# Patient Record
Sex: Female | Born: 1965 | Race: White | Hispanic: No | Marital: Married | Smoking: Never smoker
Health system: Southern US, Community
[De-identification: ages and names within clinical notes are randomized; demographics above are authoritative.]

## PROBLEM LIST (undated history)

## (undated) DIAGNOSIS — N920 Excessive and frequent menstruation with regular cycle: Secondary | ICD-10-CM

## (undated) DIAGNOSIS — Z9889 Other specified postprocedural states: Secondary | ICD-10-CM

## (undated) DIAGNOSIS — I4891 Unspecified atrial fibrillation: Secondary | ICD-10-CM

## (undated) DIAGNOSIS — O021 Missed abortion: Secondary | ICD-10-CM

## (undated) DIAGNOSIS — K219 Gastro-esophageal reflux disease without esophagitis: Secondary | ICD-10-CM

## (undated) DIAGNOSIS — E7800 Pure hypercholesterolemia, unspecified: Secondary | ICD-10-CM

## (undated) DIAGNOSIS — R000 Tachycardia, unspecified: Secondary | ICD-10-CM

## (undated) HISTORY — DX: Tachycardia, unspecified: R00.0

## (undated) HISTORY — PX: BACK SURGERY: SHX140

## (undated) HISTORY — PX: WISDOM TOOTH EXTRACTION: SHX21

## (undated) HISTORY — PX: TUBAL LIGATION: SHX77

## (undated) HISTORY — DX: Unspecified atrial fibrillation: I48.91

## (undated) HISTORY — DX: Other specified postprocedural states: Z98.890

## (undated) HISTORY — DX: Excessive and frequent menstruation with regular cycle: N92.0

## (undated) HISTORY — PX: ABDOMINAL HYSTERECTOMY: SHX81

---

## 1999-02-06 ENCOUNTER — Encounter: Payer: Self-pay | Admitting: Radiology

## 1999-02-06 ENCOUNTER — Emergency Department (HOSPITAL_COMMUNITY): Admission: EM | Admit: 1999-02-06 | Discharge: 1999-02-06 | Payer: Self-pay | Admitting: Emergency Medicine

## 1999-11-16 ENCOUNTER — Other Ambulatory Visit: Admission: RE | Admit: 1999-11-16 | Discharge: 1999-11-16 | Payer: Self-pay | Admitting: *Deleted

## 2000-10-06 ENCOUNTER — Encounter: Admission: RE | Admit: 2000-10-06 | Discharge: 2000-10-06 | Payer: Self-pay | Admitting: Family Medicine

## 2000-10-06 ENCOUNTER — Encounter: Payer: Self-pay | Admitting: Family Medicine

## 2002-01-19 ENCOUNTER — Encounter: Payer: Self-pay | Admitting: Family Medicine

## 2002-01-19 ENCOUNTER — Encounter: Admission: RE | Admit: 2002-01-19 | Discharge: 2002-01-19 | Payer: Self-pay | Admitting: Family Medicine

## 2002-12-07 ENCOUNTER — Other Ambulatory Visit: Admission: RE | Admit: 2002-12-07 | Discharge: 2002-12-07 | Payer: Self-pay | Admitting: *Deleted

## 2003-12-31 ENCOUNTER — Other Ambulatory Visit: Admission: RE | Admit: 2003-12-31 | Discharge: 2003-12-31 | Payer: Self-pay | Admitting: *Deleted

## 2007-03-21 ENCOUNTER — Other Ambulatory Visit: Admission: RE | Admit: 2007-03-21 | Discharge: 2007-03-21 | Payer: Self-pay | Admitting: Family Medicine

## 2007-04-08 ENCOUNTER — Emergency Department (HOSPITAL_COMMUNITY): Admission: EM | Admit: 2007-04-08 | Discharge: 2007-04-08 | Payer: Self-pay | Admitting: Emergency Medicine

## 2008-08-09 HISTORY — PX: SHOULDER ARTHROSCOPY W/ ROTATOR CUFF REPAIR: SHX2400

## 2013-05-18 ENCOUNTER — Other Ambulatory Visit: Payer: Self-pay | Admitting: Obstetrics and Gynecology

## 2013-05-18 DIAGNOSIS — Z1231 Encounter for screening mammogram for malignant neoplasm of breast: Secondary | ICD-10-CM

## 2013-05-31 ENCOUNTER — Ambulatory Visit: Payer: Self-pay

## 2013-06-22 ENCOUNTER — Other Ambulatory Visit: Payer: Self-pay | Admitting: Obstetrics and Gynecology

## 2013-06-25 ENCOUNTER — Other Ambulatory Visit (HOSPITAL_COMMUNITY): Payer: Self-pay | Admitting: Obstetrics and Gynecology

## 2013-06-25 ENCOUNTER — Ambulatory Visit: Payer: Self-pay

## 2013-06-25 NOTE — H&P (Signed)
Martha Washington is a 47 y.o. female P 2-0-1-2 presents for hysterectomy because of menorrhagia and dysmenorrhea.  Over the past year the patient's menses has become heavier and more painful.  Her menstrual flow lasts for six days,  with hourly pad change due to clots.  She also has cramps that will awaken her from sleep rated as a 7/10 on a 10 point pain scale.  Until advised of the dangers, she was managing her cramping with Ibuprofen 800 mg every four hours but now knows the risks of such frequent dosing.  A pelvic ultrasound in October 2014 showed a uterus: 6.70 x 5.37 x 4.19 cm with the endometrium containing poorly defined borders suggestive of adenomyosis;  the endometrial morphology, however, appeared normal per 3D rendering and measured 0.277 cm. Left ovary: 2.69 x 2.14 x 2.00 cm and right ovary: 2.30 x 1.55 x 1.58 cm.  At that same time the patient was found to have a normal TSH, CBC and prolactin.  Her FSH was not in the menopausal range.  She reports dyspareunia but denies urinary frequency, dysuria, hematuria or change in bowel movement.  She goes on to report that though she is not incontinent, she cannot "hold" her urine or bowel movements when she has to go.  A review of both medical and surgical management options for managing her symptoms however, the patient desires definitive therapy in the form of hysterectomy.   Past Medical History  OB History: G 3;  P 2-0-1-2; 1;  SVB  1988 and 1995 largest infant weighed 6 lbs. 8 oz.  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   ROS: Admits to glasses for reading and tension headaches;   denies  vision changes, nasal congestion, dysphagia, tinnitus, dizziness, hoarseness, cough,  chest pain, shortness of breath, nausea, vomiting, diarrhea,constipation,  urinary frequency, urgency  dysuria, hematuria, vaginitis symptoms, pelvic pain, swelling of joints,easy bruising,  myalgias, arthralgias, skin rashes, unexplained weight loss and except as is mentioned in the history of present illness, patient's review of systems is otherwise negative.  Physical Exam  Bp: 112/60     P: 60 bpm    R: 12   Temperature: 98.8 degrees F orally   Weight: 185 lbs.  Height: 5' 5"  BMI: 30.8  Neck: supple without masses or thyromegaly Lungs: clear to auscultation Heart: regular rate and rhythm Abdomen: soft, non-tender and no organomegaly Pelvic:EGBUS- wnl; vagina-normal rugae; uterus-normal size, cervix without lesions or motion tenderness; adnexae-no tenderness or masses Extremities:  no clubbing, cyanosis or edema   Assesment:  Menorrhagia            Dysmenorrhea  Disposition:  Robotically-assisted hysterectomy was reviewed with the patient.  Benefits of the robotic approach include lesser postoperative pain, less blood loss during surgery, reduced risk of injury to other organs due to better visualization with a 3-D HD 10 times magnifying camera, shorter hospital stay between 0-1   night and rapid recovery with return to daily routine in 2-3 weeks. Although robotically-assisted hysterectomy has a longer operative time than traditional laparotomy, in a patient with good medical history, the benefits usually outweigh the risks.  Risks include bleeding, infection, injury to other organs, need for laparotomy, transient post-operative  facial edema, increased risk of pelvic prolapse associated with any hysterectomy as well as earlier onset of menopause. Preservation or preventative removal of the ovaries was also reviewed and left to the patient's discretion. Finally, the option of supracervical hysterectomy was also discussed with the possible but yet unconfirmed benefits of reduction of pelvic prolapse. If supracervical hysterectomy is performed, Pap smear screening would continue as currently recommended, monthly bleeding is possible despite cauterization of cervical canal and a small but possible risk of cervical fibroid development is present.  The patient was given instructions on a Miralax Bowel Prep to be completed the day before her surgery.  Patient informed about FDA warning on use of morcellator dated 11/23/2012.   Discussed:  1. Incidence of post-operative diagnosis of sarcoma in women undergoing a hysterectomy is 2:1000 2. Annual incidence of leiomyosarcoma is 0.64/100,000 women 3. Sarcomas have the highest incidence in women over 65 4. Power morcellation involves risks of spreading tissue / disease. In he case of undiagnosed cancer, it may adversely affect the patient's prognosis.  The patient verbalized understanding of these risks and pre-operative instructions and has consented to proceed with a Robot Assisted Laparoscopic Hysterectomy with Bilateral Salpingectomy at [facility] on July 13, 2013 at 11:45 a.m.   CSN# [id number]   [name] J. Landin Tallon, PA-C  for Dr. Sandra A. Rivard   

## 2013-07-02 ENCOUNTER — Encounter (HOSPITAL_COMMUNITY): Payer: Self-pay | Admitting: Pharmacist

## 2013-07-09 ENCOUNTER — Encounter (HOSPITAL_COMMUNITY): Payer: Self-pay

## 2013-07-09 ENCOUNTER — Encounter (INDEPENDENT_AMBULATORY_CARE_PROVIDER_SITE_OTHER): Payer: Self-pay

## 2013-07-09 ENCOUNTER — Encounter (HOSPITAL_COMMUNITY)
Admission: RE | Admit: 2013-07-09 | Discharge: 2013-07-09 | Disposition: A | Discharge status: Home / Self Care | Payer: 59 | Source: Ambulatory Visit | Attending: Obstetrics and Gynecology | Admitting: Obstetrics and Gynecology

## 2013-07-09 DIAGNOSIS — Z01818 Encounter for other preprocedural examination: Secondary | ICD-10-CM | POA: Insufficient documentation

## 2013-07-09 DIAGNOSIS — Z01812 Encounter for preprocedural laboratory examination: Secondary | ICD-10-CM | POA: Insufficient documentation

## 2013-07-09 HISTORY — DX: Missed abortion: O02.1

## 2013-07-09 HISTORY — DX: Gastro-esophageal reflux disease without esophagitis: K21.9

## 2013-07-09 LAB — CBC
HCT: 36.3 % (ref 36.0–46.0)
Hemoglobin: 12 g/dL (ref 12.0–15.0)
MCH: 27.8 pg (ref 26.0–34.0)
MCHC: 33.1 g/dL (ref 30.0–36.0)
MCV: 84.2 fL (ref 78.0–100.0)
Platelets: 233 10*3/uL (ref 150–400)
RBC: 4.31 MIL/uL (ref 3.87–5.11)
RDW: 14 % (ref 11.5–15.5)
WBC: 4.6 10*3/uL (ref 4.0–10.5)

## 2013-07-09 LAB — BASIC METABOLIC PANEL
BUN: 10 mg/dL (ref 6–23)
CO2: 26 mEq/L (ref 19–32)
Calcium: 9 mg/dL (ref 8.4–10.5)
Chloride: 106 mEq/L (ref 96–112)
Creatinine, Ser: 0.52 mg/dL (ref 0.50–1.10)
GFR calc Af Amer: >90 mL/min (ref >90)
GFR calc non Af Amer: >90 mL/min (ref >90)
Glucose, Bld: 85 mg/dL (ref 70–99)
Potassium: 4.1 mEq/L (ref 3.5–5.1)
Sodium: 139 mEq/L (ref 135–145)

## 2013-07-09 NOTE — Patient Instructions (Addendum)
   Your procedure is scheduled on: Friday, Dec 5  Enter through the [employer] of [facility] at: 945 am Pick up the phone at the desk and dial [phone] and inform us of your arrival.  Please call this number if you have any problems the morning of surgery: [phone]  Remember: Do not eat or drink after midnight: Thursday Take these medicines the morning of surgery with a SIP OF WATER: prilosec  Do not wear jewelry, make-up, or FINGER nail polish No metal in your hair or on your body. Do not wear lotions, powders, perfumes. You may wear deodorant.  Please use your CHG wash as directed prior to surgery.  Do not shave anywhere for at least 12 hours prior to first CHG shower.  Do not bring valuables to the hospital. Contacts, dentures or bridgework may not be worn into surgery.  Leave suitcase in the car. After Surgery it may be brought to your room. For patients being admitted to the hospital, checkout time is 11:00am the day of discharge.  Home with husband Loraine Leriche.

## 2013-07-12 MED ORDER — DEXTROSE 5 % IV SOLN
2.0000 g | INTRAVENOUS | Status: AC
  Administered 2013-07-13: 2 g via INTRAVENOUS
  Filled 2013-07-12: qty 2

## 2013-07-13 ENCOUNTER — Encounter (HOSPITAL_COMMUNITY): Payer: 59 | Admitting: Anesthesiology

## 2013-07-13 ENCOUNTER — Ambulatory Visit (HOSPITAL_COMMUNITY)
Admission: RE | Admit: 2013-07-13 | Discharge: 2013-07-14 | Disposition: A | Discharge status: Home / Self Care | Payer: 59 | Source: Ambulatory Visit | Attending: Obstetrics and Gynecology | Admitting: Obstetrics and Gynecology

## 2013-07-13 ENCOUNTER — Encounter (HOSPITAL_COMMUNITY): Payer: Self-pay | Admitting: *Deleted

## 2013-07-13 ENCOUNTER — Encounter (HOSPITAL_COMMUNITY)
Admission: RE | Disposition: A | Discharge status: Home / Self Care | Payer: Self-pay | Source: Ambulatory Visit | Attending: Obstetrics and Gynecology

## 2013-07-13 ENCOUNTER — Ambulatory Visit (HOSPITAL_COMMUNITY): Payer: 59 | Admitting: Anesthesiology

## 2013-07-13 DIAGNOSIS — N803 Endometriosis of pelvic peritoneum, unspecified: Secondary | ICD-10-CM | POA: Insufficient documentation

## 2013-07-13 DIAGNOSIS — N838 Other noninflammatory disorders of ovary, fallopian tube and broad ligament: Secondary | ICD-10-CM | POA: Insufficient documentation

## 2013-07-13 DIAGNOSIS — N920 Excessive and frequent menstruation with regular cycle: Secondary | ICD-10-CM

## 2013-07-13 DIAGNOSIS — N946 Dysmenorrhea, unspecified: Secondary | ICD-10-CM | POA: Insufficient documentation

## 2013-07-13 DIAGNOSIS — IMO0002 Reserved for concepts with insufficient information to code with codable children: Secondary | ICD-10-CM | POA: Insufficient documentation

## 2013-07-13 DIAGNOSIS — N80109 Endometriosis of ovary, unspecified side, unspecified depth: Secondary | ICD-10-CM | POA: Insufficient documentation

## 2013-07-13 DIAGNOSIS — N801 Endometriosis of ovary: Secondary | ICD-10-CM | POA: Insufficient documentation

## 2013-07-13 HISTORY — PX: ROBOTIC ASSISTED TOTAL HYSTERECTOMY WITH BILATERAL SALPINGO OOPHERECTOMY: SHX6086

## 2013-07-13 HISTORY — DX: Excessive and frequent menstruation with regular cycle: N92.0

## 2013-07-13 LAB — PREGNANCY, URINE: Preg Test, Ur: NEGATIVE

## 2013-07-13 SURGERY — ROBOTIC ASSISTED TOTAL HYSTERECTOMY WITH BILATERAL SALPINGO OOPHORECTOMY
Anesthesia: General | Site: Abdomen

## 2013-07-13 MED ORDER — ROCURONIUM BROMIDE 100 MG/10ML IV SOLN
INTRAVENOUS | Status: AC
  Filled 2013-07-13: qty 1

## 2013-07-13 MED ORDER — HYDROMORPHONE HCL PF 1 MG/ML IJ SOLN
INTRAMUSCULAR | Status: AC
  Filled 2013-07-13: qty 1

## 2013-07-13 MED ORDER — NEOSTIGMINE METHYLSULFATE 1 MG/ML IJ SOLN
INTRAMUSCULAR | Status: DC | PRN
  Administered 2013-07-13: 3 mg via INTRAVENOUS

## 2013-07-13 MED ORDER — KETOROLAC TROMETHAMINE 30 MG/ML IJ SOLN: 15.0000 mg | Freq: Once | INTRAMUSCULAR | Status: DC | PRN

## 2013-07-13 MED ORDER — LACTATED RINGERS IR SOLN
Status: DC | PRN
  Administered 2013-07-13: 3000 mL

## 2013-07-13 MED ORDER — ACETAMINOPHEN 10 MG/ML IV SOLN
1000.0000 mg | Freq: Once | INTRAVENOUS | Status: AC
  Administered 2013-07-13: 1000 mg via INTRAVENOUS
  Filled 2013-07-13: qty 100

## 2013-07-13 MED ORDER — KETOROLAC TROMETHAMINE 30 MG/ML IJ SOLN
INTRAMUSCULAR | Status: DC | PRN
  Administered 2013-07-13: 30 mg via INTRAVENOUS
  Administered 2013-07-13: 30 mg via INTRAMUSCULAR

## 2013-07-13 MED ORDER — MIDAZOLAM HCL 2 MG/2ML IJ SOLN
INTRAMUSCULAR | Status: DC | PRN
  Administered 2013-07-13: 2 mg via INTRAVENOUS

## 2013-07-13 MED ORDER — ONDANSETRON HCL 4 MG/2ML IJ SOLN
INTRAMUSCULAR | Status: AC
  Filled 2013-07-13: qty 2

## 2013-07-13 MED ORDER — PROMETHAZINE HCL 25 MG/ML IJ SOLN: 6.2500 mg | INTRAMUSCULAR | Status: DC | PRN

## 2013-07-13 MED ORDER — MEPERIDINE HCL 25 MG/ML IJ SOLN: 6.2500 mg | INTRAMUSCULAR | Status: DC | PRN

## 2013-07-13 MED ORDER — MIDAZOLAM HCL 2 MG/2ML IJ SOLN
INTRAMUSCULAR | Status: AC
  Filled 2013-07-13: qty 2

## 2013-07-13 MED ORDER — MENTHOL 3 MG MT LOZG: 1.0000 | LOZENGE | OROMUCOSAL | Status: DC | PRN

## 2013-07-13 MED ORDER — LIDOCAINE HCL (CARDIAC) 20 MG/ML IV SOLN
INTRAVENOUS | Status: DC | PRN
  Administered 2013-07-13: 80 mg via INTRAVENOUS

## 2013-07-13 MED ORDER — FENTANYL CITRATE 0.05 MG/ML IJ SOLN
INTRAMUSCULAR | Status: AC
  Filled 2013-07-13: qty 2

## 2013-07-13 MED ORDER — PROPOFOL 10 MG/ML IV BOLUS
INTRAVENOUS | Status: DC | PRN
  Administered 2013-07-13: 200 mg via INTRAVENOUS

## 2013-07-13 MED ORDER — PROMETHAZINE HCL 12.5 MG PO TABS: 12.5000 mg | ORAL_TABLET | Freq: Four times a day (QID) | ORAL | Status: DC | PRN

## 2013-07-13 MED ORDER — STERILE WATER FOR IRRIGATION IR SOLN
Status: DC | PRN
  Administered 2013-07-13: 1000 mL via INTRAVESICAL

## 2013-07-13 MED ORDER — GLYCOPYRROLATE 0.2 MG/ML IJ SOLN
INTRAMUSCULAR | Status: AC
  Filled 2013-07-13: qty 3

## 2013-07-13 MED ORDER — SODIUM CHLORIDE 0.9 % IJ SOLN
INTRAMUSCULAR | Status: AC
  Filled 2013-07-13: qty 10

## 2013-07-13 MED ORDER — HYDROMORPHONE HCL PF 1 MG/ML IJ SOLN
0.2500 mg | INTRAMUSCULAR | Status: DC | PRN
  Administered 2013-07-13 (×3): 0.5 mg via INTRAVENOUS

## 2013-07-13 MED ORDER — PROPOFOL 10 MG/ML IV EMUL
INTRAVENOUS | Status: AC
  Filled 2013-07-13: qty 20

## 2013-07-13 MED ORDER — FENTANYL CITRATE 0.05 MG/ML IJ SOLN
INTRAMUSCULAR | Status: DC | PRN
  Administered 2013-07-13 (×2): 50 ug via INTRAVENOUS
  Administered 2013-07-13 (×2): 100 ug via INTRAVENOUS

## 2013-07-13 MED ORDER — ROPIVACAINE HCL 5 MG/ML IJ SOLN
INTRAMUSCULAR | Status: AC
  Filled 2013-07-13: qty 30

## 2013-07-13 MED ORDER — GLYCOPYRROLATE 0.2 MG/ML IJ SOLN
INTRAMUSCULAR | Status: DC | PRN
  Administered 2013-07-13: 0.6 mg via INTRAVENOUS

## 2013-07-13 MED ORDER — LIDOCAINE HCL (CARDIAC) 20 MG/ML IV SOLN
INTRAVENOUS | Status: AC
  Filled 2013-07-13: qty 5

## 2013-07-13 MED ORDER — OXYCODONE-ACETAMINOPHEN 5-325 MG PO TABS
1.0000 | ORAL_TABLET | ORAL | Status: DC | PRN
  Administered 2013-07-14: 1 via ORAL
  Filled 2013-07-13: qty 1

## 2013-07-13 MED ORDER — ONDANSETRON HCL 4 MG/2ML IJ SOLN
4.0000 mg | Freq: Three times a day (TID) | INTRAMUSCULAR | Status: DC | PRN
  Administered 2013-07-13: 4 mg via INTRAVENOUS
  Filled 2013-07-13: qty 2

## 2013-07-13 MED ORDER — SODIUM CHLORIDE 0.9 % IJ SOLN
INTRAMUSCULAR | Status: AC
  Filled 2013-07-13: qty 50

## 2013-07-13 MED ORDER — ONDANSETRON HCL 4 MG/2ML IJ SOLN
INTRAMUSCULAR | Status: DC | PRN
  Administered 2013-07-13: 4 mg via INTRAVENOUS

## 2013-07-13 MED ORDER — DEXAMETHASONE SODIUM PHOSPHATE 10 MG/ML IJ SOLN
INTRAMUSCULAR | Status: DC | PRN
  Administered 2013-07-13: 10 mg via INTRAVENOUS

## 2013-07-13 MED ORDER — LACTATED RINGERS IV SOLN
INTRAVENOUS | Status: DC
  Administered 2013-07-13 (×2): via INTRAVENOUS

## 2013-07-13 MED ORDER — ROPIVACAINE HCL 5 MG/ML IJ SOLN
INTRAMUSCULAR | Status: DC | PRN
  Administered 2013-07-13: 60 mL

## 2013-07-13 MED ORDER — FENTANYL CITRATE 0.05 MG/ML IJ SOLN
INTRAMUSCULAR | Status: AC
  Filled 2013-07-13: qty 5

## 2013-07-13 MED ORDER — KETOROLAC TROMETHAMINE 30 MG/ML IJ SOLN
INTRAMUSCULAR | Status: AC
  Filled 2013-07-13: qty 1

## 2013-07-13 MED ORDER — INFLUENZA VAC SPLIT QUAD 0.5 ML IM SUSP
0.5000 mL | INTRAMUSCULAR | Status: DC | PRN
  Filled 2013-07-13: qty 0.5

## 2013-07-13 MED ORDER — ONDANSETRON HCL 4 MG PO TABS
4.0000 mg | ORAL_TABLET | Freq: Three times a day (TID) | ORAL | Status: DC | PRN
  Administered 2013-07-13: 4 mg via ORAL
  Filled 2013-07-13: qty 1

## 2013-07-13 MED ORDER — HYDROMORPHONE HCL PF 1 MG/ML IJ SOLN
INTRAMUSCULAR | Status: DC | PRN
  Administered 2013-07-13: 1 mg via INTRAVENOUS

## 2013-07-13 MED ORDER — ROCURONIUM BROMIDE 100 MG/10ML IV SOLN
INTRAVENOUS | Status: DC | PRN
  Administered 2013-07-13: 50 mg via INTRAVENOUS
  Administered 2013-07-13: 20 mg via INTRAVENOUS

## 2013-07-13 MED ORDER — LACTATED RINGERS IV SOLN
INTRAVENOUS | Status: DC
  Administered 2013-07-13 – 2013-07-14 (×2): via INTRAVENOUS

## 2013-07-13 MED ORDER — IBUPROFEN 600 MG PO TABS: ORAL_TABLET | ORAL | Status: DC

## 2013-07-13 MED ORDER — OXYCODONE-ACETAMINOPHEN 5-325 MG PO TABS: 1.0000 | ORAL_TABLET | ORAL | Status: DC | PRN

## 2013-07-13 MED ORDER — IBUPROFEN 600 MG PO TABS
600.0000 mg | ORAL_TABLET | Freq: Four times a day (QID) | ORAL | Status: DC | PRN
  Administered 2013-07-13 – 2013-07-14 (×2): 600 mg via ORAL
  Filled 2013-07-13 (×2): qty 1

## 2013-07-13 MED ORDER — LIDOCAINE HCL 2 % EX GEL
CUTANEOUS | Status: AC
  Filled 2013-07-13: qty 5

## 2013-07-13 MED ORDER — DEXAMETHASONE SODIUM PHOSPHATE 10 MG/ML IJ SOLN
INTRAMUSCULAR | Status: AC
  Filled 2013-07-13: qty 1

## 2013-07-13 MED ORDER — NEOSTIGMINE METHYLSULFATE 1 MG/ML IJ SOLN
INTRAMUSCULAR | Status: AC
  Filled 2013-07-13: qty 1

## 2013-07-13 SURGICAL SUPPLY — 61 items
BARRIER ADHS 3X4 INTERCEED (GAUZE/BANDAGES/DRESSINGS) ×2 IMPLANT
BENZOIN TINCTURE PRP APPL 2/3 (GAUZE/BANDAGES/DRESSINGS) ×2 IMPLANT
CATH FOLEY 3WAY  5CC 16FR (CATHETERS) ×1
CATH FOLEY 3WAY 5CC 16FR (CATHETERS) ×1 IMPLANT
CHLORAPREP W/TINT 26ML (MISCELLANEOUS) ×2 IMPLANT
CLOTH BEACON ORANGE TIMEOUT ST (SAFETY) ×2 IMPLANT
CONT PATH 16OZ SNAP LID 3702 (MISCELLANEOUS) ×2 IMPLANT
CORD ACTIVE DISPOSABLE (ELECTRODE) ×1
CORD ELECTRO ACTIVE DISP (ELECTRODE) ×1 IMPLANT
CORDS BIPOLAR (ELECTRODE) ×2 IMPLANT
COVER MAYO STAND STRL (DRAPES) ×2 IMPLANT
COVER TABLE BACK 60X90 (DRAPES) ×2 IMPLANT
COVER TIP SHEARS 8 DVNC (MISCELLANEOUS) ×1 IMPLANT
COVER TIP SHEARS 8MM DA VINCI (MISCELLANEOUS) ×1
DECANTER SPIKE VIAL GLASS SM (MISCELLANEOUS) ×2 IMPLANT
DERMABOND ADVANCED (GAUZE/BANDAGES/DRESSINGS) ×1
DERMABOND ADVANCED .7 DNX12 (GAUZE/BANDAGES/DRESSINGS) ×1 IMPLANT
DRAPE HUG U DISPOSABLE (DRAPE) ×2 IMPLANT
DRAPE LG THREE QUARTER DISP (DRAPES) ×2 IMPLANT
DRAPE WARM FLUID 44X44 (DRAPE) ×2 IMPLANT
ELECT REM PT RETURN 9FT ADLT (ELECTROSURGICAL) ×2
ELECTRODE REM PT RTRN 9FT ADLT (ELECTROSURGICAL) ×1 IMPLANT
EVACUATOR SMOKE 8.L (FILTER) ×2 IMPLANT
GAUZE VASELINE 3X9 (GAUZE/BANDAGES/DRESSINGS) ×2 IMPLANT
GLOVE BIO SURGEON STRL SZ7 (GLOVE) ×2 IMPLANT
GLOVE BIOGEL PI IND STRL 7.0 (GLOVE) ×1 IMPLANT
GLOVE BIOGEL PI INDICATOR 7.0 (GLOVE) ×1
GLOVE ECLIPSE 6.5 STRL STRAW (GLOVE) ×2 IMPLANT
GOWN STRL REIN XL XLG (GOWN DISPOSABLE) ×2 IMPLANT
KIT ACCESSORY DA VINCI DISP (KITS) ×1
KIT ACCESSORY DVNC DISP (KITS) ×1 IMPLANT
LEGGING LITHOTOMY PAIR STRL (DRAPES) ×2 IMPLANT
NEEDLE HYPO 22GX1.5 SAFETY (NEEDLE) ×2 IMPLANT
OCCLUDER COLPOPNEUMO (BALLOONS) ×2 IMPLANT
PACK LAVH (CUSTOM PROCEDURE TRAY) ×2 IMPLANT
PAD PREP 24X48 CUFFED NSTRL (MISCELLANEOUS) ×2 IMPLANT
PLUG CATH AND CAP STER (CATHETERS) ×2 IMPLANT
PROTECTOR NERVE ULNAR (MISCELLANEOUS) ×2 IMPLANT
SET CYSTO W/LG BORE CLAMP LF (SET/KITS/TRAYS/PACK) ×2 IMPLANT
SET IRRIG TUBING LAPAROSCOPIC (IRRIGATION / IRRIGATOR) ×2 IMPLANT
SOLUTION ELECTROLUBE (MISCELLANEOUS) ×2 IMPLANT
STRIP CLOSURE SKIN 1/2X4 (GAUZE/BANDAGES/DRESSINGS) ×2 IMPLANT
SUT MNCRL AB 3-0 PS2 27 (SUTURE) ×2 IMPLANT
SUT VIC AB 0 CT1 27 (SUTURE) ×7
SUT VIC AB 0 CT1 27XBRD ANBCTR (SUTURE) ×2 IMPLANT
SUT VIC AB 0 CT1 27XBRD ANTBC (SUTURE) ×5 IMPLANT
SUT VICRYL 0 UR6 27IN ABS (SUTURE) ×2 IMPLANT
SYR 50ML LL SCALE MARK (SYRINGE) ×2 IMPLANT
SYSTEM CONVERTIBLE TROCAR (TROCAR) ×2 IMPLANT
TIP RUMI ORANGE 6.7MMX12CM (TIP) ×2 IMPLANT
TIP UTERINE 5.1X6CM LAV DISP (MISCELLANEOUS) ×2 IMPLANT
TIP UTERINE 6.7X6CM WHT DISP (MISCELLANEOUS) ×2 IMPLANT
TIP UTERINE 6.7X8CM BLUE DISP (MISCELLANEOUS) ×2 IMPLANT
TOWEL OR 17X24 6PK STRL BLUE (TOWEL DISPOSABLE) ×2 IMPLANT
TROCAR 12M 150ML BLUNT (TROCAR) ×2 IMPLANT
TROCAR DISP BLADELESS 8 DVNC (TROCAR) ×1 IMPLANT
TROCAR DISP BLADELESS 8MM (TROCAR) ×1
TROCAR XCEL 12X100 BLDLESS (ENDOMECHANICALS) ×2 IMPLANT
TUBING FILTER THERMOFLATOR (ELECTROSURGICAL) ×2 IMPLANT
WARMER LAPAROSCOPE (MISCELLANEOUS) ×2 IMPLANT
WATER STERILE IRR 1000ML POUR (IV SOLUTION) ×2 IMPLANT

## 2013-07-13 NOTE — Discharge Summary (Signed)
Physician Discharge Summary  Patient ID: Martha Washington MRN: [id number] DOB/AGE: [birth year] 47 y.o.  Admit date: 07/13/2013 Discharge date: 07/13/2013   Discharge Diagnoses: Menorrhagia, Dysmenorrhea, Pelvic Adhesions, Posterior Cul-de-sac Lesion and Clinical Endometriosis Active Problems:   * No active hospital problems. *   Operation: Robot Assisted Total Laparoscopic Hysterectomy with Bilateral Salpingectomy, Lysis of Adhesions and Excision of Posterior Cul-de-Sac Lesion  Discharged Condition: Good  Hospital Course: On the date of admission the patient underwent the aforementioned procedures and tolerated them well.  Post operative course was unremarkable with the patient resuming bowel and bladder function on the evening of her surgery and therefore was discharged home.  Disposition: Home to self care  Discharge Medications:    Medication List         ibuprofen 600 MG tablet  Commonly known as:  ADVIL,MOTRIN  1 po pc every 6 hours for 5 days then as needed for pain.     multivitamin with minerals Tabs tablet  Take 1 tablet by mouth daily.     omeprazole 20 MG capsule  Commonly known as:  PRILOSEC  Take 20 mg by mouth daily.     oxyCODONE-acetaminophen 5-325 MG per tablet  Commonly known as:  ROXICET  Take 1-2 tablets by mouth every 4 (four) hours as needed for severe pain.     promethazine 12.5 MG tablet  Commonly known as:  PHENERGAN  Take 1 tablet (12.5 mg total) by mouth every 6 (six) hours as needed for nausea or vomiting.     vitamin B-12 100 MCG tablet  Commonly known as:  CYANOCOBALAMIN  Take 100 mcg by mouth daily.        Follow-up: Dr. Estanislado Pandy,  July 30, 2013 at 9 a.m.   SignedHenreitta Leber, PA-C 07/13/2013, 3:27 PM

## 2013-07-13 NOTE — Interval H&P Note (Signed)
History and Physical Interval Note:  07/13/2013 11:53 AM  Martha Washington  has presented today for surgery, with the diagnosis of Menorrhagia, Dysmenorrhea, Adenomyosis  The various methods of treatment have been discussed with the patient and family. After consideration of risks, benefits and other options for treatment, the patient has consented to  Procedure(s): ROBOTIC ASSISTED TOTAL HYSTERECTOMY WITH BILATERAL SALPINGECTOMY (N/A) as a surgical intervention .  The patient's history has been reviewed, patient examined, no change in status, stable for surgery.  I have reviewed the patient's chart and labs.  Questions were answered to the patient's satisfaction.     Keola Heninger A

## 2013-07-13 NOTE — Op Note (Signed)
Preoperative diagnosis: Menorrhagia and severe dysmenorrhea. Suspicion of adenomyosis.  Postoperative diagnosis: Same   Anesthesia: General  Anesthesiologist: Dr. Brayton Caves  Procedure: Robotically assisted total hysterectomy with bilateral salpingectomy  Surgeon: Dr. Dois Davenport Zineb Glade  Assistant: Henreitta Leber P.A.-C.  Estimated blood loss: 50 cc Procedure:  After being informed of the planned procedure with possible complications including but not limited to bleeding, infection, injury to other organs, need for laparotomy, possible need for morcellation with risks and benefits reviewed, expected hospital stay and recovery, informed consent is obtained and patient is taken to or #7. She is placed in  lithotomy position on a sticky mattress and beanbag with both arms padded and tucked on each side and bilateral knee-high sequential compressive devices. She is given general anesthesia with endotracheal intubation without any complication. She is prepped and draped in a sterile fashion. A three-way Foley catheter is inserted in her bladder.  Pelvic exam reveals: anteverted and normal size uterus with 2 normal adnexa  A weighted speculum is inserted in the vagina and the anterior lip of the cervix is grasped with a tenaculum forcep. We proceed with a paracervical block and vaginal infiltration using ropivacaine 0.5% diluted 1 in 1 with saline. The uterus was then sounded at 10 cm. We easily dilate the cervix using Hegar dilator to  #27 which allows for easy placement of the intrauterine RUMI manipulator with a 4.0 KOH ring and a vaginal occluder. The ring is sutured to the cervix with 0 Vicryl.  Trocar placement is decided. We infiltrate at the umbilicus with 10 cc of ropivacaine per protocol and perform a 10 mm vertical incision which is brought down bluntly to the fascia. The fascia is identified and grasped with Coker forceps. The fascia is incised with Mayo scissors. Peritoneum is entered  bluntly. A pursestring suture of 0 Vicryl is placed on the fascia and a 10 mm Hassan trocar is easily inserted in the abdominal cavity held in placed with a Purstring suture. This allows for easy insufflation of a pneumoperitoneum using warmed CO2 at a maximum pressure of 15 mm of mercury. 60 cc of Ropivacaine 0.5 % diluted 1 in 1 is sent in the pelvis and the patient is positioned in reverse Trendelenburg. We then placed one  8mm robotic trocar on the left, one 8mm robotic trocar on the right and one 5 mm patient's side assistant trocar on the right  after infiltrating every site  with ropivacaine per protocol. The robot is docked on the left of the patient after positioning her in Trendelenburg. A monopolar scissor is inserted in arm #1 and a PK gyrus forcep is inserted in arm #2.  Preparation and docking is completed in 35 minutes.  Observation: uterus is globulous with 2 normal adnexa. Adhesions are xeen between the sigmoid colon and the left pelvic wall all the way to the cul-de-sac. Ovaries are normal except for a corpus luteum on the left ovary. Appendix and liver are normal.  We start with sharp lysis of adhesions on the left pelvic wall and now have a good view of the ureter. We then proceed on the right side by cauterizing the mesosalpynx , the right utero-ovarian ligament and the right round ligament . This is then sectioned with monopolar scissors. This gives [place] entry into the retroperitoneal space with an easy dissection of the anterior broad ligament. This was opened all the way to the left round ligament.   We then proceed with systematic dissection of the bladder over way  from the anterior vaginal cuff which is easily identified with the KOH ring. The plane of dissection is easily identified and confirmed after filling the bladder with 200 cc of saline. We are able to dissect the bladder 2 cm below the KOH ring. We then opened the posterior right broad ligament all the way to the posterior  KOH ring after identifying the full course of the right ureter.   Moving to the left side we cauterize the left round ligament , the left utero-ovarian ligament and  the mesosalpinx in between. This pedicle is the sectionned. Entry into the retroperitoneal space allows [place] to complete dissection of the bladder on the left side and skeletonized the all uterine vessels. The left broad ligament is then dissected all the way to the posterior KOH ring after identifying the full course of the left ureter.   With pressure on the KOH ring and the bladder fully dissected below we are able to cauterize the uterine vessels on both sides at the level of the KOH ring.  The vaginal occluder is inflated and we proceed with a 360 colpotomy using an open monopolar scissors and freeing the uterus entirely with its 2 tubes.  The uterus is delivered vaginally with traction only. The vaginal occluder is reinserted in the vagina to maintain pneumoperitoneum.  Instruments are then modified for a suture cut in arm #1 and a long tip forcep in arm #2. We proceed with closure of the vaginal cuff with figure-of-eight stitches of 0 Vicryl. The needles are attached to the anterior abdominal wall for future removal. We irrigated profusely with warm saline and confirm a satisfactory hemostasis as well as 2 normal ureters with good mobility and no dilatation.The needles are removed via the 8 mm undocked trocar. 2 1/2 sheets of Interceed are placed on the vaginal cuff to avoid future adhesions.  All instruments are then removed and the robot is undocked. Console time: 1 hours and 45 minutes.  All trochars are removed under direct visualization after evacuating the pneumoperitoneum.  The fascia of the supraumbilical incision is closed with the previously placed pursestring suture of 0 Vicryl. All incisions are then closed with subcuticular suture of 3-0 Monocryl and Dermabond.  A speculum is inserted in the vagina to confirm a  adequate closure of the vaginal cuff and good hemostasis.  Instrument and sponge count is complete x2. Estimated blood loss is minimal. The procedure is well tolerated by the patient is taken to recovery room in a well and stable condition.  Specimen: Uterus and tubes weighing 129 g

## 2013-07-13 NOTE — H&P (View-Only) (Signed)
Martha Washington is a 47 y.o. female P 2-0-1-2 presents for hysterectomy because of menorrhagia and dysmenorrhea.  Over the past year the patient's menses has become heavier and more painful.  Her menstrual flow lasts for six days,  with hourly pad change due to clots.  She also has cramps that will awaken her from sleep rated as a 7/10 on a 10 point pain scale.  Until advised of the dangers, she was managing her cramping with Ibuprofen 800 mg every four hours but now knows the risks of such frequent dosing.  A pelvic ultrasound in October 2014 showed a uterus: 6.70 x 5.37 x 4.19 cm with the endometrium containing poorly defined borders suggestive of adenomyosis;  the endometrial morphology, however, appeared normal per 3D rendering and measured 0.277 cm. Left ovary: 2.69 x 2.14 x 2.00 cm and right ovary: 2.30 x 1.55 x 1.58 cm.  At that same time the patient was found to have a normal TSH, CBC and prolactin.  Her FSH was not in the menopausal range.  She reports dyspareunia but denies urinary frequency, dysuria, hematuria or change in bowel movement.  She goes on to report that though she is not incontinent, she cannot "hold" her urine or bowel movements when she has to go.  A review of both medical and surgical management options for managing her symptoms however, the patient desires definitive therapy in the form of hysterectomy.   Past Medical History  OB History: G 3;  P 2-0-1-2; 1;  SVB  1988 and 1995 largest infant weighed 6 lbs. 8 oz.  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   ROS: Admits to glasses for reading and tension headaches;   denies  vision changes, nasal congestion, dysphagia, tinnitus, dizziness, hoarseness, cough,  chest pain, shortness of breath, nausea, vomiting, diarrhea,constipation,  urinary frequency, urgency  dysuria, hematuria, vaginitis symptoms, pelvic pain, swelling of joints,easy bruising,  myalgias, arthralgias, skin rashes, unexplained weight loss and except as is mentioned in the history of present illness, patient's review of systems is otherwise negative.  Physical Exam  Bp: 112/60     P: 60 bpm    R: 12   Temperature: 98.8 degrees F orally   Weight: 185 lbs.  Height: 5\' 5"   BMI: 30.8  Neck: supple without masses or thyromegaly Lungs: clear to auscultation Heart: regular rate and rhythm Abdomen: soft, non-tender and no organomegaly Pelvic:EGBUS- wnl; vagina-normal rugae; uterus-normal size, cervix without lesions or motion tenderness; adnexae-no tenderness or masses Extremities:  no clubbing, cyanosis or edema   Assesment:  Menorrhagia            Dysmenorrhea  Disposition:  Robotically-assisted hysterectomy was reviewed with the patient.  Benefits of the robotic approach include lesser postoperative pain, less blood loss during surgery, reduced risk of injury to other organs due to better visualization with a 3-D HD 10 times magnifying camera, shorter hospital stay between 0-1  night and rapid recovery with return to daily routine in 2-3 weeks. Although robotically-assisted hysterectomy has a longer operative time than traditional laparotomy, in a patient with good medical history, the benefits usually outweigh the risks.  Risks include bleeding, infection, injury to other organs, need for laparotomy, transient post-operative  facial edema, increased risk of pelvic prolapse associated with any hysterectomy as well as earlier onset of menopause. Preservation or preventative removal of the ovaries was also reviewed and left to the patient's discretion. Finally, the option of supracervical hysterectomy was also discussed with the possible but yet unconfirmed benefits of reduction of pelvic prolapse. If supracervical hysterectomy is performed, Pap smear screening would continue as currently recommended, monthly bleeding is possible despite cauterization of cervical canal and a small but possible risk of cervical fibroid development is present.  The patient was given instructions on a Miralax Bowel Prep to be completed the day before her surgery.  Patient informed about FDA warning on use of morcellator dated 11/23/2012.   Discussed:  1. Incidence of post-operative diagnosis of sarcoma in women undergoing a hysterectomy is 2:1000 2. Annual incidence of leiomyosarcoma is 0.64/100,000 women 3. Sarcomas have the highest incidence in women over 65 4. Power morcellation involves risks of spreading tissue / disease. In he case of undiagnosed cancer, it may adversely affect the patient's prognosis.  The patient verbalized understanding of these risks and pre-operative instructions and has consented to proceed with a Robot Assisted Laparoscopic Hysterectomy with Bilateral Salpingectomy at [facility] on July 13, 2013 at 11:45 a.m.   CSN# [id number]   [place] Silveria J. Lowell Guitar, PA-C  for Dr. Crist Fat. Rivard

## 2013-07-13 NOTE — Anesthesia Postprocedure Evaluation (Signed)
  Anesthesia Post Note  Patient: Martha Washington  Procedure(s) Performed: Procedure(s) (LRB): ROBOTIC ASSISTED TOTAL HYSTERECTOMY WITH BILATERAL SALPINGECTOMY (N/A)  Anesthesia type: GA  Patient location: PACU  Post pain: Pain level controlled  Post assessment: Post-op Vital signs reviewed  Last Vitals:  Filed Vitals:   07/13/13 1535  BP: 126/67  Pulse: 62  Temp: 37 C  Resp: 10    Post vital signs: Reviewed  Level of consciousness: sedated  Complications: No apparent anesthesia complications

## 2013-07-13 NOTE — Transfer of Care (Signed)
Immediate Anesthesia Transfer of Care Note  Patient: Martha Washington  Procedure(s) Performed: Procedure(s): ROBOTIC ASSISTED TOTAL HYSTERECTOMY WITH BILATERAL SALPINGECTOMY (N/A)  Patient Location: PACU  Anesthesia Type:General  Level of Consciousness: awake  Airway & Oxygen Therapy: Patient Spontanous Breathing  Post-op Assessment: Report given to PACU RN  Post vital signs: stable  Filed Vitals:   07/13/13 1009  BP: 107/74  Pulse: 64  Temp: 36.6 C  Resp: 18    Complications: No apparent anesthesia complications

## 2013-07-13 NOTE — Anesthesia Preprocedure Evaluation (Signed)
Anesthesia Evaluation  Patient identified by MRN, date of birth, ID band Patient awake    Reviewed: Allergy & Precautions, H&P , NPO status , Patient's Chart, lab work & pertinent test results  Airway Mallampati: I TM Distance: >3 FB Neck ROM: full    Dental no notable dental hx. (+) Teeth Intact   Pulmonary neg pulmonary ROS,    Pulmonary exam normal       Cardiovascular negative cardio ROS      Neuro/Psych negative neurological ROS  negative psych ROS   GI/Hepatic Neg liver ROS, GERD-  Medicated and Controlled,  Endo/Other  negative endocrine ROS  Renal/GU negative Renal ROS  negative genitourinary   Musculoskeletal negative musculoskeletal ROS (+)   Abdominal Normal abdominal exam  (+)   Peds  Hematology negative hematology ROS (+)   Anesthesia Other Findings   Reproductive/Obstetrics negative OB ROS                           Anesthesia Physical Anesthesia Plan  ASA: II  Anesthesia Plan: General   Post-op Pain Management:    Induction: Intravenous  Airway Management Planned: Oral ETT  Additional Equipment:   Intra-op Plan:   Post-operative Plan: Extubation in OR  Informed Consent: I have reviewed the patients History and Physical, chart, labs and discussed the procedure including the risks, benefits and alternatives for the proposed anesthesia with the patient or authorized representative who has indicated his/her understanding and acceptance.   Dental Advisory Given  Plan Discussed with: CRNA and Surgeon  Anesthesia Plan Comments:         Anesthesia Quick Evaluation

## 2013-07-14 NOTE — Progress Notes (Signed)
Dr. Estanislado Pandy called and notified of patient voiding, pain controlled, N/V subsided and pt ambulating. MD confirms patient can be discharged at this time. Discharge instructions reviewed and pt verbalizes understanding. Pt pushed in wheelchair by NT and secured in vehicle.

## 2013-07-16 ENCOUNTER — Encounter (HOSPITAL_COMMUNITY): Payer: Self-pay | Admitting: Obstetrics and Gynecology

## 2014-04-29 ENCOUNTER — Other Ambulatory Visit (HOSPITAL_COMMUNITY)
Admission: RE | Admit: 2014-04-29 | Discharge: 2014-04-29 | Disposition: A | Discharge status: Home / Self Care | Payer: 59 | Source: Ambulatory Visit | Attending: Internal Medicine | Admitting: Internal Medicine

## 2014-04-29 ENCOUNTER — Other Ambulatory Visit: Payer: Self-pay | Admitting: Internal Medicine

## 2014-04-29 DIAGNOSIS — Z1151 Encounter for screening for human papillomavirus (HPV): Secondary | ICD-10-CM | POA: Diagnosis present

## 2014-04-29 DIAGNOSIS — Z01419 Encounter for gynecological examination (general) (routine) without abnormal findings: Secondary | ICD-10-CM | POA: Insufficient documentation

## 2014-05-02 LAB — CYTOLOGY - PAP

## 2014-08-19 ENCOUNTER — Other Ambulatory Visit: Payer: Self-pay

## 2014-08-19 DIAGNOSIS — Z1231 Encounter for screening mammogram for malignant neoplasm of breast: Secondary | ICD-10-CM

## 2014-09-04 ENCOUNTER — Ambulatory Visit
Admission: RE | Admit: 2014-09-04 | Discharge: 2014-09-04 | Disposition: A | Discharge status: Home / Self Care | Payer: 59 | Source: Ambulatory Visit

## 2014-09-04 DIAGNOSIS — Z1231 Encounter for screening mammogram for malignant neoplasm of breast: Secondary | ICD-10-CM

## 2014-09-05 ENCOUNTER — Other Ambulatory Visit: Payer: Self-pay | Admitting: Internal Medicine

## 2014-09-05 DIAGNOSIS — R928 Other abnormal and inconclusive findings on diagnostic imaging of breast: Secondary | ICD-10-CM

## 2014-09-06 ENCOUNTER — Ambulatory Visit
Admission: RE | Admit: 2014-09-06 | Discharge: 2014-09-06 | Disposition: A | Discharge status: Home / Self Care | Payer: 59 | Source: Ambulatory Visit | Attending: Internal Medicine | Admitting: Internal Medicine

## 2014-09-06 DIAGNOSIS — R928 Other abnormal and inconclusive findings on diagnostic imaging of breast: Secondary | ICD-10-CM

## 2014-10-15 ENCOUNTER — Encounter (HOSPITAL_BASED_OUTPATIENT_CLINIC_OR_DEPARTMENT_OTHER): Payer: Self-pay | Admitting: *Deleted

## 2014-10-15 NOTE — Progress Notes (Signed)
No meds-no labs needed 

## 2014-10-16 ENCOUNTER — Other Ambulatory Visit: Payer: Self-pay | Admitting: Physician Assistant

## 2014-10-16 NOTE — H&P (Signed)
  [name] [address]   [address] [place], [place] [id number] [phone] A Division of [facility]  Martha Aveaniel F. Cadee Agro, M.D.   Robert A. Thurston HoleWainer, M.D.   Burnell BlanksW. Dan Caffrey, M.D.   Eulas PostJoshua P. Landau, M.D.   Lunette StandsAnna Voytek, M.D Jewel Baizeimothy D. Eulah PontMurphy, M.D.  Buford DresserWesley R. Ibazebo, M.D.  Estell HarpinJames S. Kramer, M.D.    Melina Fiddlerebecca S. Bassett, M.D. Mary L. Isidoro DonningAnton, PA-C  Kirstin A. Shepperson, PA-C  Josh [place], PA-C HardwickBrandon Parry, [place] DakotaOPA-C   RE: Martha Washington, Martha                                [phone]      DOB: 1965-12-17 PROGRESS NOTE: 07-23-14 This is a 49 year-old female who presents to our clinic today with right shoulder pain.  Martha Washington sustained an indirect vertical load injury to the right shoulder this past Saturday.  Since then she has had significant rest pain and night pain.  This is aggravated with bringing her arm overhead, as well as across her body.  She is unable to sleep on the affected side.  She is also describing some recurrent popping as well.  She does have a history of a right shoulder decompression back in November of 2010, but had no issues between surgical intervention and this new injury.   Past medical, social and family history reviewed in detail on the patient questionnaire and signed.  Review of systems: As detailed in HPI.  All others reviewed and are negative.   EXAMINATION: Well-developed, well-nourished female in no acute distress.  Alert and oriented x 3.  Examination of her right shoulder reveals forward flexion and abduction to about 90 degrees.  She can internally rotate to her back pocket.  Decreased external rotation.  Examination of her right shoulder after subacromial injection reveals near full motion, however this is still painful past 90 degrees of forward flexion.  Positive empty can.  5/5 strength throughout.  She is neurovascularly intact distally.   X-RAYS: Three views of her right shoulder reveal a Type I acromion.   Adequate distal clavicle excision.  No superior migration of the humeral head.    IMPRESSION: Probable traumatic right shoulder bursitis.    PLAN: At this point we do not feel like Martha Washington tore her rotator cuff.  We are proceeding with a 1:4 subacromial injection, in addition to Jobe exercises.  If this is not any better in the next two weeks Martha Washington will call and we will get an MRI.  In the meantime I gave her a prescription for Tramadol to help with the pain.    PROCEDURE NOTE: The patient's clinical condition is marked by substantial pain and/or significant functional disability.  Other conservative therapy has not provided relief, is contraindicated, or not appropriate.  There is a reasonable likelihood that injection will significantly improve the patient's pain and/or functional disability. Patient is seated on the exam table.  The right shoulder is prepped with Betadine and alcohol and injected into the subacromial space with 1:4 Depo-Medrol/Marcaine.  Patient tolerated the procedure without difficulty.   Martha Washington, M.D.   Electronically verified by Martha Aveaniel F. Beadie Matsunaga, M.D. DFM(LA):jjh D 07-24-14 T 07-25-14  Patient seen and examined 10/15/14. MRI partial cuff tear. Continued marked symptoms. Otherwise no change. Proceed with scope and cuff repair.

## 2014-10-17 ENCOUNTER — Ambulatory Visit (HOSPITAL_BASED_OUTPATIENT_CLINIC_OR_DEPARTMENT_OTHER): Payer: 59 | Admitting: Anesthesiology

## 2014-10-17 ENCOUNTER — Encounter (HOSPITAL_BASED_OUTPATIENT_CLINIC_OR_DEPARTMENT_OTHER): Payer: Self-pay

## 2014-10-17 ENCOUNTER — Ambulatory Visit (HOSPITAL_BASED_OUTPATIENT_CLINIC_OR_DEPARTMENT_OTHER)
Admission: RE | Admit: 2014-10-17 | Discharge: 2014-10-17 | Disposition: A | Discharge status: Home / Self Care | Payer: 59 | Source: Ambulatory Visit | Attending: Orthopedic Surgery | Admitting: Orthopedic Surgery

## 2014-10-17 ENCOUNTER — Encounter (HOSPITAL_BASED_OUTPATIENT_CLINIC_OR_DEPARTMENT_OTHER)
Admission: RE | Disposition: A | Discharge status: Home / Self Care | Payer: Self-pay | Source: Ambulatory Visit | Attending: Orthopedic Surgery

## 2014-10-17 DIAGNOSIS — S46011A Strain of muscle(s) and tendon(s) of the rotator cuff of right shoulder, initial encounter: Secondary | ICD-10-CM | POA: Diagnosis not present

## 2014-10-17 DIAGNOSIS — K219 Gastro-esophageal reflux disease without esophagitis: Secondary | ICD-10-CM | POA: Diagnosis not present

## 2014-10-17 DIAGNOSIS — M7541 Impingement syndrome of right shoulder: Secondary | ICD-10-CM | POA: Diagnosis not present

## 2014-10-17 DIAGNOSIS — M24111 Other articular cartilage disorders, right shoulder: Secondary | ICD-10-CM | POA: Diagnosis not present

## 2014-10-17 DIAGNOSIS — M75101 Unspecified rotator cuff tear or rupture of right shoulder, not specified as traumatic: Secondary | ICD-10-CM | POA: Diagnosis present

## 2014-10-17 DIAGNOSIS — X58XXXA Exposure to other specified factors, initial encounter: Secondary | ICD-10-CM | POA: Diagnosis not present

## 2014-10-17 HISTORY — PX: SHOULDER ARTHROSCOPY WITH ROTATOR CUFF REPAIR: SHX5685

## 2014-10-17 LAB — POCT HEMOGLOBIN-HEMACUE: Hemoglobin: 13.6 g/dL (ref 12.0–15.0)

## 2014-10-17 SURGERY — ARTHROSCOPY, SHOULDER, WITH ROTATOR CUFF REPAIR
Anesthesia: General | Site: Shoulder | Laterality: Right

## 2014-10-17 MED ORDER — HYDROMORPHONE HCL 1 MG/ML IJ SOLN: 0.5000 mg | INTRAMUSCULAR | Status: DC | PRN

## 2014-10-17 MED ORDER — ONDANSETRON HCL 4 MG/2ML IJ SOLN: 4.0000 mg | Freq: Once | INTRAMUSCULAR | Status: DC | PRN

## 2014-10-17 MED ORDER — SUCCINYLCHOLINE CHLORIDE 20 MG/ML IJ SOLN
INTRAMUSCULAR | Status: DC | PRN
  Administered 2014-10-17: 100 mg via INTRAVENOUS

## 2014-10-17 MED ORDER — METOCLOPRAMIDE HCL 5 MG/ML IJ SOLN: 5.0000 mg | Freq: Three times a day (TID) | INTRAMUSCULAR | Status: DC | PRN

## 2014-10-17 MED ORDER — LACTATED RINGERS IV SOLN
INTRAVENOUS | Status: DC
  Administered 2014-10-17: 10:00:00 via INTRAVENOUS

## 2014-10-17 MED ORDER — CHLORHEXIDINE GLUCONATE 4 % EX LIQD: 60.0000 mL | Freq: Once | CUTANEOUS | Status: DC

## 2014-10-17 MED ORDER — CLINDAMYCIN PHOSPHATE 900 MG/50ML IV SOLN
INTRAVENOUS | Status: AC
  Filled 2014-10-17: qty 50

## 2014-10-17 MED ORDER — METOCLOPRAMIDE HCL 5 MG PO TABS: 5.0000 mg | ORAL_TABLET | Freq: Three times a day (TID) | ORAL | Status: DC | PRN

## 2014-10-17 MED ORDER — OXYCODONE HCL 5 MG/5ML PO SOLN
5.0000 mg | Freq: Once | ORAL | Status: DC | PRN
Start: 2014-10-17 — End: 2014-10-17

## 2014-10-17 MED ORDER — ONDANSETRON HCL 4 MG PO TABS: 4.0000 mg | ORAL_TABLET | Freq: Three times a day (TID) | ORAL | Status: DC | PRN

## 2014-10-17 MED ORDER — DEXTROSE 5 % IV SOLN: 500.0000 mg | Freq: Four times a day (QID) | INTRAVENOUS | Status: DC | PRN

## 2014-10-17 MED ORDER — FENTANYL CITRATE 0.05 MG/ML IJ SOLN
50.0000 ug | INTRAMUSCULAR | Status: DC | PRN
  Administered 2014-10-17: 100 ug via INTRAVENOUS

## 2014-10-17 MED ORDER — FENTANYL CITRATE 0.05 MG/ML IJ SOLN
INTRAMUSCULAR | Status: AC
Start: 2014-10-17 — End: 2014-10-17
  Filled 2014-10-17: qty 2

## 2014-10-17 MED ORDER — CLINDAMYCIN PHOSPHATE 900 MG/50ML IV SOLN
900.0000 mg | INTRAVENOUS | Status: AC
  Administered 2014-10-17: 900 mg via INTRAVENOUS

## 2014-10-17 MED ORDER — OXYCODONE HCL 5 MG PO TABS: 5.0000 mg | ORAL_TABLET | Freq: Once | ORAL | Status: DC | PRN

## 2014-10-17 MED ORDER — OXYCODONE-ACETAMINOPHEN 5-325 MG PO TABS: 1.0000 | ORAL_TABLET | ORAL | Status: DC | PRN

## 2014-10-17 MED ORDER — ONDANSETRON HCL 4 MG/2ML IJ SOLN
INTRAMUSCULAR | Status: DC | PRN
  Administered 2014-10-17: 4 mg via INTRAVENOUS

## 2014-10-17 MED ORDER — ONDANSETRON HCL 4 MG PO TABS: 4.0000 mg | ORAL_TABLET | Freq: Four times a day (QID) | ORAL | Status: DC | PRN

## 2014-10-17 MED ORDER — ONDANSETRON HCL 4 MG/2ML IJ SOLN: 4.0000 mg | Freq: Four times a day (QID) | INTRAMUSCULAR | Status: DC | PRN

## 2014-10-17 MED ORDER — HYDROMORPHONE HCL 1 MG/ML IJ SOLN: 0.2500 mg | INTRAMUSCULAR | Status: DC | PRN

## 2014-10-17 MED ORDER — PROPOFOL 10 MG/ML IV BOLUS
INTRAVENOUS | Status: DC | PRN
  Administered 2014-10-17: 200 mg via INTRAVENOUS

## 2014-10-17 MED ORDER — FENTANYL CITRATE 0.05 MG/ML IJ SOLN
INTRAMUSCULAR | Status: DC | PRN
  Administered 2014-10-17: 25 ug via INTRAVENOUS

## 2014-10-17 MED ORDER — MIDAZOLAM HCL 2 MG/2ML IJ SOLN
INTRAMUSCULAR | Status: AC
  Filled 2014-10-17: qty 2

## 2014-10-17 MED ORDER — LACTATED RINGERS IV SOLN
INTRAVENOUS | Status: DC
  Administered 2014-10-17 (×2): via INTRAVENOUS

## 2014-10-17 MED ORDER — EPHEDRINE SULFATE 50 MG/ML IJ SOLN
INTRAMUSCULAR | Status: DC | PRN
  Administered 2014-10-17: 10 mg via INTRAVENOUS

## 2014-10-17 MED ORDER — SODIUM CHLORIDE 0.9 % IJ SOLN
INTRAMUSCULAR | Status: AC
  Filled 2014-10-17: qty 10

## 2014-10-17 MED ORDER — PROMETHAZINE HCL 25 MG/ML IJ SOLN
INTRAMUSCULAR | Status: AC
  Filled 2014-10-17: qty 1

## 2014-10-17 MED ORDER — PROPOFOL 10 MG/ML IV BOLUS
INTRAVENOUS | Status: AC
  Filled 2014-10-17: qty 20

## 2014-10-17 MED ORDER — DEXAMETHASONE SODIUM PHOSPHATE 4 MG/ML IJ SOLN
INTRAMUSCULAR | Status: DC | PRN
  Administered 2014-10-17: 10 mg via INTRAVENOUS

## 2014-10-17 MED ORDER — PROMETHAZINE HCL 25 MG/ML IJ SOLN
6.2500 mg | INTRAMUSCULAR | Status: DC | PRN
  Administered 2014-10-17: 6.25 mg via INTRAVENOUS

## 2014-10-17 MED ORDER — METHOCARBAMOL 500 MG PO TABS: 500.0000 mg | ORAL_TABLET | Freq: Four times a day (QID) | ORAL | Status: DC | PRN

## 2014-10-17 MED ORDER — FENTANYL CITRATE 0.05 MG/ML IJ SOLN
INTRAMUSCULAR | Status: AC
  Filled 2014-10-17: qty 6

## 2014-10-17 MED ORDER — BUPIVACAINE-EPINEPHRINE (PF) 0.5% -1:200000 IJ SOLN
INTRAMUSCULAR | Status: DC | PRN
  Administered 2014-10-17: 25 mL via PERINEURAL

## 2014-10-17 MED ORDER — SODIUM CHLORIDE 0.9 % IR SOLN
Status: DC | PRN
  Administered 2014-10-17: 6000 mL

## 2014-10-17 MED ORDER — MIDAZOLAM HCL 2 MG/2ML IJ SOLN
1.0000 mg | INTRAMUSCULAR | Status: DC | PRN
  Administered 2014-10-17: 2 mg via INTRAVENOUS

## 2014-10-17 SURGICAL SUPPLY — 69 items
ANCHOR PEEK SWIVEL LOCK 5.5 (Anchor) ×4 IMPLANT
BENZOIN TINCTURE PRP APPL 2/3 (GAUZE/BANDAGES/DRESSINGS) IMPLANT
BLADE CUTTER GATOR 3.5 (BLADE) ×2 IMPLANT
BLADE CUTTER MENIS 5.5 (BLADE) IMPLANT
BLADE GREAT WHITE 4.2 (BLADE) ×2 IMPLANT
BLADE SURG 15 STRL LF DISP TIS (BLADE) ×1 IMPLANT
BLADE SURG 15 STRL SS (BLADE) ×1
BUR OVAL 6.0 (BURR) IMPLANT
CANNULA DRY DOC 8X75 (CANNULA) IMPLANT
CANNULA TWIST IN 8.25X7CM (CANNULA) IMPLANT
DECANTER SPIKE VIAL GLASS SM (MISCELLANEOUS) IMPLANT
DRAPE STERI 35X30 U-POUCH (DRAPES) ×2 IMPLANT
DRAPE U-SHAPE 47X51 STRL (DRAPES) ×2 IMPLANT
DRAPE U-SHAPE 76X120 STRL (DRAPES) ×4 IMPLANT
DRSG PAD ABDOMINAL 8X10 ST (GAUZE/BANDAGES/DRESSINGS) ×2 IMPLANT
DURAPREP 26ML APPLICATOR (WOUND CARE) ×2 IMPLANT
ELECT MENISCUS 165MM 90D (ELECTRODE) ×2 IMPLANT
ELECT REM PT RETURN 9FT ADLT (ELECTROSURGICAL) ×2
ELECTRODE REM PT RTRN 9FT ADLT (ELECTROSURGICAL) ×1 IMPLANT
GAUZE SPONGE 4X4 12PLY STRL (GAUZE/BANDAGES/DRESSINGS) ×4 IMPLANT
GAUZE XEROFORM 1X8 LF (GAUZE/BANDAGES/DRESSINGS) ×2 IMPLANT
GLOVE BIO SURGEON STRL SZ 6.5 (GLOVE) ×2 IMPLANT
GLOVE BIOGEL PI IND STRL 7.0 (GLOVE) ×3 IMPLANT
GLOVE BIOGEL PI INDICATOR 7.0 (GLOVE) ×3
GLOVE ECLIPSE 7.0 STRL STRAW (GLOVE) ×2 IMPLANT
GLOVE ORTHO TXT STRL SZ7.5 (GLOVE) ×2 IMPLANT
GLOVE SURG ORTHO 8.0 STRL STRW (GLOVE) ×2 IMPLANT
GOWN STRL REUS W/ TWL LRG LVL3 (GOWN DISPOSABLE) ×2 IMPLANT
GOWN STRL REUS W/ TWL XL LVL3 (GOWN DISPOSABLE) ×1 IMPLANT
GOWN STRL REUS W/TWL LRG LVL3 (GOWN DISPOSABLE) ×2
GOWN STRL REUS W/TWL XL LVL3 (GOWN DISPOSABLE) ×1
IV NS IRRIG 3000ML ARTHROMATIC (IV SOLUTION) ×8 IMPLANT
MANIFOLD NEPTUNE II (INSTRUMENTS) ×2 IMPLANT
NDL SUT 6 .5 CRC .975X.05 MAYO (NEEDLE) IMPLANT
NEEDLE MAYO TAPER (NEEDLE)
NEEDLE SCORPION MULTI FIRE (NEEDLE) IMPLANT
NS IRRIG 1000ML POUR BTL (IV SOLUTION) IMPLANT
PACK ARTHROSCOPY DSU (CUSTOM PROCEDURE TRAY) ×2 IMPLANT
PACK BASIN DAY SURGERY FS (CUSTOM PROCEDURE TRAY) ×2 IMPLANT
PASSER SUT SWANSON 36MM LOOP (INSTRUMENTS) IMPLANT
PENCIL BUTTON HOLSTER BLD 10FT (ELECTRODE) ×2 IMPLANT
SET ARTHROSCOPY TUBING (MISCELLANEOUS) ×1
SET ARTHROSCOPY TUBING LN (MISCELLANEOUS) ×1 IMPLANT
SLEEVE SCD COMPRESS KNEE MED (MISCELLANEOUS) IMPLANT
SLING ARM IMMOBILIZER LRG (SOFTGOODS) IMPLANT
SLING ARM IMMOBILIZER MED (SOFTGOODS) IMPLANT
SLING ARM LRG ADULT FOAM STRAP (SOFTGOODS) ×2 IMPLANT
SLING ARM MED ADULT FOAM STRAP (SOFTGOODS) IMPLANT
SLING ARM XL FOAM STRAP (SOFTGOODS) IMPLANT
SPONGE LAP 4X18 X RAY DECT (DISPOSABLE) IMPLANT
STRIP CLOSURE SKIN 1/2X4 (GAUZE/BANDAGES/DRESSINGS) IMPLANT
SUCTION FRAZIER TIP 10 FR DISP (SUCTIONS) IMPLANT
SUT ETHIBOND 2 OS 4 DA (SUTURE) IMPLANT
SUT ETHILON 2 0 FS 18 (SUTURE) IMPLANT
SUT ETHILON 3 0 PS 1 (SUTURE) IMPLANT
SUT FIBERWIRE #2 38 T-5 BLUE (SUTURE)
SUT RETRIEVER MED (INSTRUMENTS) IMPLANT
SUT TIGER TAPE 7 IN WHITE (SUTURE) IMPLANT
SUT VIC AB 0 CT1 27 (SUTURE)
SUT VIC AB 0 CT1 27XBRD ANBCTR (SUTURE) IMPLANT
SUT VIC AB 2-0 SH 27 (SUTURE)
SUT VIC AB 2-0 SH 27XBRD (SUTURE) IMPLANT
SUT VIC AB 3-0 FS2 27 (SUTURE) IMPLANT
SUTURE FIBERWR #2 38 T-5 BLUE (SUTURE) IMPLANT
TAPE FIBER 2MM 7IN #2 BLUE (SUTURE) IMPLANT
TOWEL OR 17X24 6PK STRL BLUE (TOWEL DISPOSABLE) ×2 IMPLANT
TOWEL OR NON WOVEN STRL DISP B (DISPOSABLE) ×2 IMPLANT
WATER STERILE IRR 1000ML POUR (IV SOLUTION) ×2 IMPLANT
YANKAUER SUCT BULB TIP NO VENT (SUCTIONS) IMPLANT

## 2014-10-17 NOTE — Interval H&P Note (Signed)
History and Physical Interval Note:  10/17/2014 7:30 AM  Martha Washington  has presented today for surgery, with the diagnosis of STRAIN OF MUSCLE AND TENDON OF ROTATOR CUFF OF RIGHT SHOULDER INITIAL ENCOUNTER BICIPITAL,  IMPINGEMENT SYNDROME OF RIGHT SHOULDER   The various methods of treatment have been discussed with the patient and family. After consideration of risks, benefits and other options for treatment, the patient has consented to  Procedure(s): RIGHT SHOULDER ARTHROSCOPY  DEBRIDEMENT ACROMIOPLASTY WITH SUBACROMIAL DECOMPRESSION, ROTATOR CUFF REPAIR AND BICEP TENDON REPAIR (Right) as a surgical intervention .  The patient's history has been reviewed, patient examined, no change in status, stable for surgery.  I have reviewed the patient's chart and labs.  Questions were answered to the patient's satisfaction.     Tamyia Minich F

## 2014-10-17 NOTE — Anesthesia Procedure Notes (Addendum)
Anesthesia Regional Block:  Interscalene brachial plexus block  Pre-Anesthetic Checklist: ,, timeout performed, Correct Patient, Correct Site, Correct Laterality, Correct Procedure, Correct Position, site marked, Risks and benefits discussed,  Surgical consent,  Pre-op evaluation,  At surgeon's request and post-op pain management  Laterality: Right and Upper  Prep: chloraprep       Needles:  Injection technique: Single-shot  Needle Type: Echogenic Needle     Needle Length: 5cm 5 cm Needle Gauge: 21 and 21 G    Additional Needles:  Procedures: ultrasound guided (picture in chart) Interscalene brachial plexus block Narrative:  Start time: 10/17/2014 9:55 AM End time: 10/17/2014 10:00 AM Injection made incrementally with aspirations every 5 mL.  Performed by: Personally  Anesthesiologist: CREWS, DAVID   Procedure Name: Intubation Date/Time: 10/17/2014 10:13 AM Performed by: Gar GibbonKEETON, Dorethy Tomey S Pre-anesthesia Checklist: Patient identified, Emergency Drugs available, Suction available and Patient being monitored Patient Re-evaluated:Patient Re-evaluated prior to inductionOxygen Delivery Method: Circle System Utilized Preoxygenation: Pre-oxygenation with 100% oxygen Intubation Type: IV induction Ventilation: Mask ventilation without difficulty Laryngoscope Size: Mac and 3 Grade View: Grade II Tube type: Oral Tube size: 7.0 mm Number of attempts: 1 Airway Equipment and Method: Stylet and Oral airway Placement Confirmation: ETT inserted through vocal cords under direct vision,  positive ETCO2 and breath sounds checked- equal and bilateral Tube secured with: Tape Dental Injury: Teeth and Oropharynx as per pre-operative assessment

## 2014-10-17 NOTE — Anesthesia Postprocedure Evaluation (Signed)
  Anesthesia Post-op Note  Patient: Martha Washington  Procedure(s) Performed: Procedure(s): SHOULDER ARTHROSCOPY WITH ROTATOR CUFF REPAIR (Right)  Patient Location: PACU  Anesthesia Type: General with regional block for post op pain   Level of Consciousness: awake, alert  and oriented  Airway and Oxygen Therapy: Patient Spontanous Breathing  Post-op Pain: none  Post-op Assessment: Post-op Vital signs reviewed  Post-op Vital Signs: Reviewed  Last Vitals:  Filed Vitals:   10/17/14 1245  BP: 113/65  Pulse: 65  Temp:   Resp: 12    Complications: No apparent anesthesia complications

## 2014-10-17 NOTE — Discharge Instructions (Signed)
Shouder arthroscopy, rotator cuff repair, subacromial decompression °Care After Instructions °Refer to this sheet in the next few weeks. These discharge instructions provide you with general information on caring for yourself after you leave the hospital. Your caregiver may also give you specific instructions. Your treatment has been planned according to the most current medical practices available, but unavoidable complications sometimes occur. If you have any problems or questions after discharge, please call your caregiver. ° °HOME INSTRUCTIONS °You may resume a normal diet and activities as directed.  °Take showers instead of baths until informed otherwise.  °Change bandages (dressings) in 3 days.  Swab wounds daily with betadine.  Wash shoulder with soap and water.  Pat dry.  Cover wounds with bandaids. °Only take over-the-counter or prescription medicines for pain, discomfort, or fever as directed by your caregiver.  °Wear your sling for the next 6 weeks unless otherwise instructed. °Eat a well-balanced diet.  °Avoid lifting or driving until you are instructed otherwise.  °Make an appointment to see your caregiver for stitches (suture) or staple removal as directed.  ° °SEEK MEDICAL CARE IF: °You have swelling of your calf or leg.  °You develop shortness of breath or chest pain.  °You have redness, swelling, or increasing pain in the wound.  °There is pus or any unusual drainage coming from the surgical site.  °You notice a bad smell coming from the surgical site or dressing.  °The surgical site breaks open after sutures or staples have been removed.  °There is persistent bleeding from the suture or staple line.  °You are getting worse or are not improving.  °You have any other questions or concerns.  ° °SEEK IMMEDIATE MEDICAL CARE IF:  °You have a fever greater than 101 °You develop a rash.  °You have difficulty breathing.  °You develop any reaction or side effects to medicines given.  °Your knee motion is  decreasing rather than improving.  ° °MAKE SURE YOU:  °Understand these instructions.  °Will watch your condition.  °Will get help right away if you are not doing well or get worse.  ° °Regional Anesthesia Blocks ° °1. Numbness or the inability to move the "blocked" extremity may last from 3-48 hours after placement. The length of time depends on the medication injected and your individual response to the medication. If the numbness is not going away after 48 hours, call your surgeon. ° °2. The extremity that is blocked will need to be protected until the numbness is gone and the  Strength has returned. Because you cannot feel it, you will need to take extra care to avoid injury. Because it may be weak, you may have difficulty moving it or using it. You may not know what position it is in without looking at it while the block is in effect. ° °3. For blocks in the legs and feet, returning to weight bearing and walking needs to be done carefully. You will need to wait until the numbness is entirely gone and the strength has returned. You should be able to move your leg and foot normally before you try and bear weight or walk. You will need someone to be with you when you first try to ensure you do not fall and possibly risk injury. ° °4. Bruising and tenderness at the needle site are common side effects and will resolve in a few days. ° °5. Persistent numbness or new problems with movement should be communicated to the surgeon or the [facility] ([phone])/   [facility] ([phone]). ° ° °Post Anesthesia Home Care Instructions ° °Activity: °Get plenty of rest for the remainder of the day. A responsible adult should stay with you for 24 hours following the procedure.  °For the next 24 hours, DO NOT: °-Drive a car °-Operate machinery °-Drink alcoholic beverages °-Take any medication unless instructed by your physician °-Make any legal decisions or sign important papers. ° °Meals: °Start  with liquid foods such as gelatin or soup. Progress to regular foods as tolerated. Avoid greasy, spicy, heavy foods. If nausea and/or vomiting occur, drink only clear liquids until the nausea and/or vomiting subsides. Call your physician if vomiting continues. ° °Special Instructions/Symptoms: °Your throat may feel dry or sore from the anesthesia or the breathing tube placed in your throat during surgery. If this causes discomfort, gargle with warm salt water. The discomfort should disappear within 24 hours. ° °

## 2014-10-17 NOTE — Anesthesia Preprocedure Evaluation (Signed)
Anesthesia Evaluation  Patient identified by MRN, date of birth, ID band Patient awake    Reviewed: Allergy & Precautions, NPO status , Patient's Chart, lab work & pertinent test results  Airway Mallampati: I  TM Distance: >3 FB Neck ROM: Full    Dental  (+) Teeth Intact, Dental Advisory Given   Pulmonary  breath sounds clear to auscultation        Cardiovascular Rhythm:Regular Rate:Normal     Neuro/Psych    GI/Hepatic GERD-  Medicated and Controlled,  Endo/Other    Renal/GU      Musculoskeletal   Abdominal   Peds  Hematology   Anesthesia Other Findings   Reproductive/Obstetrics                             Anesthesia Physical Anesthesia Plan  ASA: II  Anesthesia Plan: General   Post-op Pain Management:    Induction: Intravenous  Airway Management Planned: Oral ETT  Additional Equipment:   Intra-op Plan:   Post-operative Plan: Extubation in OR  Informed Consent: I have reviewed the patients History and Physical, chart, labs and discussed the procedure including the risks, benefits and alternatives for the proposed anesthesia with the patient or authorized representative who has indicated his/her understanding and acceptance.   Dental advisory given  Plan Discussed with: CRNA, Anesthesiologist and Surgeon  Anesthesia Plan Comments:         Anesthesia Quick Evaluation  

## 2014-10-17 NOTE — Progress Notes (Signed)
Assisted Dr. Crews with right, ultrasound guided, interscalene  block. Side rails up, monitors on throughout procedure. See vital signs in flow sheet. Tolerated Procedure well. 

## 2014-10-17 NOTE — Transfer of Care (Signed)
Immediate Anesthesia Transfer of Care Note  Patient: Martha Washington  Procedure(s) Performed: Procedure(s): RIGHT SHOULDER ARTHROSCOPY  DEBRIDEMENT ACROMIOPLASTY WITH SUBACROMIAL DECOMPRESSION, ROTATOR CUFF REPAIR  (Right)  Patient Location: PACU  Anesthesia Type:GA combined with regional for post-op pain  Level of Consciousness: awake, sedated and patient cooperative  Airway & Oxygen Therapy: Patient Spontanous Breathing and Patient connected to face mask oxygen  Post-op Assessment: Report given to RN and Post -op Vital signs reviewed and stable  Post vital signs: Reviewed and stable  Last Vitals:  Filed Vitals:   10/17/14 1005  BP: 111/55  Pulse: 72  Temp:   Resp: 17    Complications: No apparent anesthesia complications

## 2014-10-18 NOTE — Op Note (Signed)
NAMAlcide Evener:  Martha Washington, Martha Washington             ACCOUNT NO.:  [id number]  MEDICAL RECORD NO.:  [id number]  LOCATION:                                 FACILITY:  PHYSICIAN:  Loreta Aveaniel F. Murphy, M.D. DATE OF BIRTH:  1966-01-26  DATE OF PROCEDURE:  10/17/2014 DATE OF DISCHARGE:  10/17/2014                              OPERATIVE REPORT   PREOPERATIVE DIAGNOSES:  Right shoulder previous decompression.  New, acute traumatic rotator cuff tear.  POSTOPERATIVE DIAGNOSES:  Right shoulder previous decompression.  New, acute traumatic rotator cuff tear with a functional full-thickness tear through the supraspinatus tendon crescent region.  Some complex tearing, inferior and anterior labrum.  Soft tissue impingement with adhesions, subacromial bursitis.  Adequate acromioplasty, distal clavicle excision, bony decompression.  PROCEDURES:  Right shoulder exam under anesthesia, arthroscopy. Debridement of labrum.  Debridement and mobilization of rotator cuff. Revision decompression with bursectomy, debridement and lysis of adhesions.  Arthroscopic-assisted rotator cuff repair with FiberWire suture x2, SwiveLock anchors x2.  SURGEON:  Loreta Aveaniel F. Murphy, M.D.  ASSISTANT:  Mikey KirschnerLindsey Stanberry, PA, present throughout the entire case and necessary for timely completion of procedure.  ANESTHESIA:  General.  BLOOD LOSS:  Minimal.  SPECIMENS:  None.  CULTURES:  None.  COMPLICATIONS:  None.  DRESSINGS:  Soft compressive with shoulder immobilizer.  DESCRIPTION OF PROCEDURE:  The patient was brought to the operating room and placed on the operating table in supine position.  After adequate anesthesia had been obtained, shoulder examined.  Full motion and stable shoulder.  Placed in a beach-chair position on the shoulder positioner, prepped and draped in usual sterile fashion.  Three portals; anterior, posterior and lateral.  Arthroscope was introduced, shoulder distended and inspected.  Some tearing of the  labrum debrided.  Biceps tendon, biceps anchor, and articular cartilage otherwise looked good.  Rotator cuff from below was intact, but thin throughout the crescent region. Cannula redirected subacromially.  Functional full-thickness tear throughout the entire crescent region of the cuff.  This was debrided and mobilized for repair.  Adhesions, recurrent bursitis all cleared out to complete subacromial decompression.  I looked in the previous bony work, which was adequate.  With a cannula laterally, the mobilized cuff was captured with two horizontal mattress sutures and Scorpion device. These were then firmly anchored down the tuberosity with two SwiveLock anchors.  At completion, adequacy of decompression as well as cuff repair confirmed viewing from all portals.  Instruments and fluid were removed.  Portals were closed with nylon.  Sterile compressive dressing applied.  Anesthesia reversed.  Brought to the recovery room. Tolerated the surgery well.  No complications.     Loreta Aveaniel F. Murphy, M.D.     DFM/MEDQ  D:  10/17/2014  T:  10/18/2014  Job:  [id number]

## 2014-10-21 ENCOUNTER — Encounter (HOSPITAL_BASED_OUTPATIENT_CLINIC_OR_DEPARTMENT_OTHER): Payer: Self-pay | Admitting: Orthopedic Surgery

## 2014-10-24 ENCOUNTER — Encounter (HOSPITAL_BASED_OUTPATIENT_CLINIC_OR_DEPARTMENT_OTHER): Payer: Self-pay | Admitting: Orthopedic Surgery

## 2015-06-04 ENCOUNTER — Other Ambulatory Visit: Payer: Self-pay | Admitting: Internal Medicine

## 2015-06-04 DIAGNOSIS — R928 Other abnormal and inconclusive findings on diagnostic imaging of breast: Secondary | ICD-10-CM

## 2015-06-10 ENCOUNTER — Other Ambulatory Visit: Payer: 59

## 2016-03-10 ENCOUNTER — Other Ambulatory Visit: Payer: Self-pay | Admitting: Physician Assistant

## 2016-03-10 DIAGNOSIS — K219 Gastro-esophageal reflux disease without esophagitis: Secondary | ICD-10-CM

## 2016-03-10 DIAGNOSIS — R1011 Right upper quadrant pain: Secondary | ICD-10-CM

## 2016-03-19 ENCOUNTER — Ambulatory Visit
Admission: RE | Admit: 2016-03-19 | Discharge: 2016-03-19 | Disposition: A | Discharge status: Home / Self Care | Payer: Commercial Managed Care - HMO | Source: Ambulatory Visit | Attending: Physician Assistant | Admitting: Physician Assistant

## 2016-03-19 ENCOUNTER — Other Ambulatory Visit (HOSPITAL_COMMUNITY): Payer: Self-pay | Admitting: Physician Assistant

## 2016-03-19 DIAGNOSIS — R1011 Right upper quadrant pain: Secondary | ICD-10-CM

## 2016-03-19 DIAGNOSIS — K219 Gastro-esophageal reflux disease without esophagitis: Secondary | ICD-10-CM

## 2016-03-30 ENCOUNTER — Encounter (HOSPITAL_COMMUNITY)
Admission: RE | Admit: 2016-03-30 | Discharge: 2016-03-30 | Disposition: A | Discharge status: Home / Self Care | Payer: Commercial Managed Care - HMO | Source: Ambulatory Visit | Attending: Physician Assistant | Admitting: Physician Assistant

## 2016-03-30 DIAGNOSIS — R1011 Right upper quadrant pain: Secondary | ICD-10-CM | POA: Diagnosis present

## 2016-03-30 MED ORDER — TECHNETIUM TC 99M MEBROFENIN IV KIT
5.1000 | PACK | Freq: Once | INTRAVENOUS | Status: AC | PRN
  Administered 2016-03-30: 5 via INTRAVENOUS

## 2016-10-27 ENCOUNTER — Other Ambulatory Visit: Payer: Self-pay | Admitting: Obstetrics and Gynecology

## 2016-10-27 DIAGNOSIS — N63 Unspecified lump in unspecified breast: Secondary | ICD-10-CM

## 2016-10-27 DIAGNOSIS — N6489 Other specified disorders of breast: Secondary | ICD-10-CM

## 2016-11-04 ENCOUNTER — Other Ambulatory Visit: Payer: Self-pay | Admitting: Physician Assistant

## 2016-11-04 DIAGNOSIS — R1310 Dysphagia, unspecified: Secondary | ICD-10-CM

## 2016-11-05 ENCOUNTER — Other Ambulatory Visit: Payer: Commercial Managed Care - HMO

## 2016-11-08 ENCOUNTER — Ambulatory Visit
Admission: RE | Admit: 2016-11-08 | Discharge: 2016-11-08 | Disposition: A | Discharge status: Home / Self Care | Payer: Commercial Managed Care - HMO | Source: Ambulatory Visit | Attending: Physician Assistant | Admitting: Physician Assistant

## 2016-11-08 DIAGNOSIS — R1310 Dysphagia, unspecified: Secondary | ICD-10-CM

## 2016-11-09 ENCOUNTER — Other Ambulatory Visit: Payer: Commercial Managed Care - HMO

## 2016-11-17 ENCOUNTER — Ambulatory Visit
Admission: RE | Admit: 2016-11-17 | Discharge: 2016-11-17 | Disposition: A | Discharge status: Home / Self Care | Payer: Commercial Managed Care - HMO | Source: Ambulatory Visit | Attending: Obstetrics and Gynecology | Admitting: Obstetrics and Gynecology

## 2016-11-17 DIAGNOSIS — N6489 Other specified disorders of breast: Secondary | ICD-10-CM

## 2016-11-17 DIAGNOSIS — N63 Unspecified lump in unspecified breast: Secondary | ICD-10-CM

## 2017-03-09 HISTORY — PX: BUNIONECTOMY WITH HAMMERTOE RECONSTRUCTION: SHX5600

## 2017-06-17 ENCOUNTER — Other Ambulatory Visit: Payer: Self-pay | Admitting: Orthopedic Surgery

## 2017-07-27 ENCOUNTER — Encounter (HOSPITAL_BASED_OUTPATIENT_CLINIC_OR_DEPARTMENT_OTHER): Payer: Self-pay | Admitting: *Deleted

## 2017-08-04 ENCOUNTER — Encounter (HOSPITAL_BASED_OUTPATIENT_CLINIC_OR_DEPARTMENT_OTHER): Payer: Self-pay | Admitting: Certified Registered"

## 2017-08-04 ENCOUNTER — Other Ambulatory Visit: Payer: Self-pay

## 2017-08-04 ENCOUNTER — Observation Stay: Admission: AD | Admit: 2017-08-04 | Payer: 59 | Source: Ambulatory Visit | Admitting: Internal Medicine

## 2017-08-04 ENCOUNTER — Observation Stay (HOSPITAL_BASED_OUTPATIENT_CLINIC_OR_DEPARTMENT_OTHER)
Admission: RE | Admit: 2017-08-04 | Discharge: 2017-08-05 | Disposition: A | Discharge status: Home / Self Care | Payer: 59 | Source: Ambulatory Visit | Attending: Internal Medicine | Admitting: Internal Medicine

## 2017-08-04 ENCOUNTER — Ambulatory Visit (HOSPITAL_BASED_OUTPATIENT_CLINIC_OR_DEPARTMENT_OTHER): Payer: 59 | Admitting: Certified Registered"

## 2017-08-04 ENCOUNTER — Encounter (HOSPITAL_COMMUNITY)
Admission: RE | Disposition: A | Discharge status: Home / Self Care | Payer: Self-pay | Source: Ambulatory Visit | Attending: Internal Medicine

## 2017-08-04 DIAGNOSIS — Z880 Allergy status to penicillin: Secondary | ICD-10-CM | POA: Diagnosis not present

## 2017-08-04 DIAGNOSIS — Z79899 Other long term (current) drug therapy: Secondary | ICD-10-CM | POA: Insufficient documentation

## 2017-08-04 DIAGNOSIS — Z9889 Other specified postprocedural states: Secondary | ICD-10-CM

## 2017-08-04 DIAGNOSIS — M2042 Other hammer toe(s) (acquired), left foot: Secondary | ICD-10-CM | POA: Insufficient documentation

## 2017-08-04 DIAGNOSIS — Z885 Allergy status to narcotic agent status: Secondary | ICD-10-CM | POA: Insufficient documentation

## 2017-08-04 DIAGNOSIS — M21612 Bunion of left foot: Secondary | ICD-10-CM | POA: Diagnosis present

## 2017-08-04 DIAGNOSIS — R000 Tachycardia, unspecified: Secondary | ICD-10-CM | POA: Diagnosis not present

## 2017-08-04 DIAGNOSIS — Z7982 Long term (current) use of aspirin: Secondary | ICD-10-CM | POA: Diagnosis not present

## 2017-08-04 DIAGNOSIS — M7742 Metatarsalgia, left foot: Secondary | ICD-10-CM | POA: Insufficient documentation

## 2017-08-04 DIAGNOSIS — I4891 Unspecified atrial fibrillation: Secondary | ICD-10-CM | POA: Diagnosis not present

## 2017-08-04 HISTORY — DX: Unspecified atrial fibrillation: I48.91

## 2017-08-04 HISTORY — DX: Pure hypercholesterolemia, unspecified: E78.00

## 2017-08-04 HISTORY — DX: Tachycardia, unspecified: R00.0

## 2017-08-04 HISTORY — PX: WEIL OSTEOTOMY: SHX5044

## 2017-08-04 HISTORY — PX: BUNIONECTOMY WITH HAMMERTOE RECONSTRUCTION: SHX5600

## 2017-08-04 HISTORY — DX: Other specified postprocedural states: Z98.890

## 2017-08-04 LAB — COMPREHENSIVE METABOLIC PANEL
ALT: 17 U/L (ref 14–54)
AST: 27 U/L (ref 15–41)
Albumin: 3.8 g/dL (ref 3.5–5.0)
Alkaline Phosphatase: 58 U/L (ref 38–126)
Anion gap: 8 (ref 5–15)
BUN: 9 mg/dL (ref 6–20)
CO2: 26 mmol/L (ref 22–32)
Calcium: 9.3 mg/dL (ref 8.9–10.3)
Chloride: 105 mmol/L (ref 101–111)
Creatinine, Ser: 0.62 mg/dL (ref 0.44–1.00)
GFR calc Af Amer: >60 mL/min (ref >60)
GFR calc non Af Amer: >60 mL/min (ref >60)
Glucose, Bld: 121 mg/dL — ABNORMAL HIGH (ref 65–99)
Potassium: 3.8 mmol/L (ref 3.5–5.1)
Sodium: 139 mmol/L (ref 135–145)
Total Bilirubin: 0.6 mg/dL (ref 0.3–1.2)
Total Protein: 6.8 g/dL (ref 6.5–8.1)

## 2017-08-04 LAB — CBC WITH DIFFERENTIAL/PLATELET
Basophils Absolute: 0 10*3/uL (ref 0.0–0.1)
Basophils Relative: 0 %
Eosinophils Absolute: 0 10*3/uL (ref 0.0–0.7)
Eosinophils Relative: 0 %
HCT: 43.1 % (ref 36.0–46.0)
Hemoglobin: 14.2 g/dL (ref 12.0–15.0)
Lymphocytes Relative: 7 %
Lymphs Abs: 0.5 10*3/uL — ABNORMAL LOW (ref 0.7–4.0)
MCH: 28.5 pg (ref 26.0–34.0)
MCHC: 32.9 g/dL (ref 30.0–36.0)
MCV: 86.4 fL (ref 78.0–100.0)
Monocytes Absolute: 0.1 10*3/uL (ref 0.1–1.0)
Monocytes Relative: 1 %
Neutro Abs: 6.9 10*3/uL (ref 1.7–7.7)
Neutrophils Relative %: 92 %
Platelets: 227 10*3/uL (ref 150–400)
RBC: 4.99 MIL/uL (ref 3.87–5.11)
RDW: 13.1 % (ref 11.5–15.5)
WBC: 7.5 10*3/uL (ref 4.0–10.5)

## 2017-08-04 LAB — HEMOGLOBIN A1C
Hgb A1c MFr Bld: 5.2 % (ref 4.8–5.6)
Mean Plasma Glucose: 102.54 mg/dL

## 2017-08-04 LAB — MAGNESIUM: Magnesium: 1.8 mg/dL (ref 1.7–2.4)

## 2017-08-04 LAB — TSH: TSH: 0.975 u[IU]/mL (ref 0.350–4.500)

## 2017-08-04 SURGERY — BUNIONECTOMY, WITH HAMMER TOE CORRECTION
Anesthesia: General | Site: Foot | Laterality: Left

## 2017-08-04 MED ORDER — PROPOFOL 10 MG/ML IV BOLUS
INTRAVENOUS | Status: DC | PRN
  Administered 2017-08-04: 200 mg via INTRAVENOUS

## 2017-08-04 MED ORDER — MIDAZOLAM HCL 2 MG/2ML IJ SOLN
1.0000 mg | INTRAMUSCULAR | Status: DC | PRN
  Administered 2017-08-04: 2 mg via INTRAVENOUS

## 2017-08-04 MED ORDER — CEFAZOLIN SODIUM-DEXTROSE 2-4 GM/100ML-% IV SOLN
INTRAVENOUS | Status: AC
  Filled 2017-08-04: qty 100

## 2017-08-04 MED ORDER — ACETAMINOPHEN 325 MG PO TABS
650.0000 mg | ORAL_TABLET | ORAL | Status: DC | PRN
  Administered 2017-08-05: 650 mg via ORAL
  Filled 2017-08-04: qty 2

## 2017-08-04 MED ORDER — ASPIRIN EC 81 MG PO TBEC: 81.0000 mg | DELAYED_RELEASE_TABLET | Freq: Two times a day (BID) | ORAL | 0 refills | Status: DC

## 2017-08-04 MED ORDER — SODIUM CHLORIDE 0.9 % IV SOLN: INTRAVENOUS | Status: DC

## 2017-08-04 MED ORDER — SCOPOLAMINE 1 MG/3DAYS TD PT72: 1.0000 | MEDICATED_PATCH | Freq: Once | TRANSDERMAL | Status: DC | PRN

## 2017-08-04 MED ORDER — OXYCODONE HCL 5 MG PO TABS: 5.0000 mg | ORAL_TABLET | ORAL | 0 refills | Status: DC | PRN

## 2017-08-04 MED ORDER — FENTANYL CITRATE (PF) 100 MCG/2ML IJ SOLN
INTRAMUSCULAR | Status: AC
  Filled 2017-08-04: qty 2

## 2017-08-04 MED ORDER — PROMETHAZINE HCL 25 MG/ML IJ SOLN
INTRAMUSCULAR | Status: AC
  Filled 2017-08-04: qty 1

## 2017-08-04 MED ORDER — PROMETHAZINE HCL 25 MG/ML IJ SOLN
6.2500 mg | Freq: Once | INTRAMUSCULAR | Status: AC
  Administered 2017-08-04: 6.25 mg via INTRAVENOUS

## 2017-08-04 MED ORDER — BUPIVACAINE-EPINEPHRINE (PF) 0.5% -1:200000 IJ SOLN
INTRAMUSCULAR | Status: DC | PRN
  Administered 2017-08-04: 30 mL via PERINEURAL

## 2017-08-04 MED ORDER — DOCUSATE SODIUM 100 MG PO CAPS: 100.0000 mg | ORAL_CAPSULE | Freq: Two times a day (BID) | ORAL | 0 refills | Status: DC

## 2017-08-04 MED ORDER — ONDANSETRON HCL 4 MG/2ML IJ SOLN
INTRAMUSCULAR | Status: DC | PRN
  Administered 2017-08-04: 4 mg via INTRAVENOUS

## 2017-08-04 MED ORDER — DEXAMETHASONE SODIUM PHOSPHATE 10 MG/ML IJ SOLN
INTRAMUSCULAR | Status: DC | PRN
  Administered 2017-08-04: 10 mg via INTRAVENOUS

## 2017-08-04 MED ORDER — MIDAZOLAM HCL 2 MG/2ML IJ SOLN
INTRAMUSCULAR | Status: AC
  Filled 2017-08-04: qty 2

## 2017-08-04 MED ORDER — ASPIRIN EC 81 MG PO TBEC
81.0000 mg | DELAYED_RELEASE_TABLET | Freq: Two times a day (BID) | ORAL | Status: DC
  Administered 2017-08-04 – 2017-08-05 (×3): 81 mg via ORAL
  Filled 2017-08-04 (×3): qty 1

## 2017-08-04 MED ORDER — SENNA 8.6 MG PO TABS: 2.0000 | ORAL_TABLET | Freq: Two times a day (BID) | ORAL | 0 refills | Status: DC

## 2017-08-04 MED ORDER — FENTANYL CITRATE (PF) 100 MCG/2ML IJ SOLN
50.0000 ug | INTRAMUSCULAR | Status: DC | PRN
  Administered 2017-08-04: 100 ug via INTRAVENOUS

## 2017-08-04 MED ORDER — ONDANSETRON HCL 4 MG PO TABS: 4.0000 mg | ORAL_TABLET | Freq: Every day | ORAL | 0 refills | Status: DC | PRN

## 2017-08-04 MED ORDER — BUPIVACAINE-EPINEPHRINE (PF) 0.5% -1:200000 IJ SOLN
INTRAMUSCULAR | Status: AC
  Filled 2017-08-04: qty 90

## 2017-08-04 MED ORDER — METOPROLOL TARTRATE 5 MG/5ML IV SOLN: 2.5000 mg | INTRAVENOUS | Status: DC | PRN

## 2017-08-04 MED ORDER — METOPROLOL TARTRATE 5 MG/5ML IV SOLN
5.0000 mg | Freq: Once | INTRAVENOUS | Status: AC
  Administered 2017-08-04: 5 mg via INTRAVENOUS

## 2017-08-04 MED ORDER — TRAMADOL HCL 50 MG PO TABS
50.0000 mg | ORAL_TABLET | Freq: Four times a day (QID) | ORAL | Status: DC | PRN
  Administered 2017-08-04 – 2017-08-05 (×4): 50 mg via ORAL
  Filled 2017-08-04 (×4): qty 1

## 2017-08-04 MED ORDER — ONDANSETRON HCL 4 MG/2ML IJ SOLN: 4.0000 mg | Freq: Four times a day (QID) | INTRAMUSCULAR | Status: DC | PRN

## 2017-08-04 MED ORDER — ONDANSETRON HCL 4 MG/2ML IJ SOLN
4.0000 mg | Freq: Four times a day (QID) | INTRAMUSCULAR | Status: DC | PRN
Start: 2017-08-04 — End: 2017-08-05

## 2017-08-04 MED ORDER — TRAMADOL HCL 50 MG PO TABS: 50.0000 mg | ORAL_TABLET | ORAL | 0 refills | Status: DC | PRN

## 2017-08-04 MED ORDER — CEFAZOLIN SODIUM-DEXTROSE 2-4 GM/100ML-% IV SOLN
2.0000 g | INTRAVENOUS | Status: AC
  Administered 2017-08-04: 2 g via INTRAVENOUS

## 2017-08-04 MED ORDER — HYDROMORPHONE HCL 1 MG/ML IJ SOLN: 0.2500 mg | INTRAMUSCULAR | Status: DC | PRN

## 2017-08-04 MED ORDER — LIDOCAINE HCL (CARDIAC) 20 MG/ML IV SOLN
INTRAVENOUS | Status: DC | PRN
  Administered 2017-08-04: 30 mg via INTRAVENOUS

## 2017-08-04 MED ORDER — CHLORHEXIDINE GLUCONATE 4 % EX LIQD: 60.0000 mL | Freq: Once | CUTANEOUS | Status: DC

## 2017-08-04 MED ORDER — LACTATED RINGERS IV SOLN
INTRAVENOUS | Status: DC
  Administered 2017-08-04 (×2): via INTRAVENOUS

## 2017-08-04 SURGICAL SUPPLY — 74 items
BANDAGE ESMARK 6X9 LF (GAUZE/BANDAGES/DRESSINGS) IMPLANT
BIT DRILL 1.8 CANN MAX VPC (BIT) ×2 IMPLANT
BIT DRILL 2.4 AO COUPLING CANN (BIT) ×2 IMPLANT
BLADE AVERAGE 25X9 (BLADE) ×2 IMPLANT
BLADE LONG MED 25X9 (BLADE) IMPLANT
BLADE MICRO SAGITTAL (BLADE) ×2 IMPLANT
BLADE OSC/SAG .038X5.5 CUT EDG (BLADE) ×2 IMPLANT
BLADE SURG 15 STRL LF DISP TIS (BLADE) ×3 IMPLANT
BLADE SURG 15 STRL SS (BLADE) ×3
BNDG COHESIVE 4X5 TAN STRL (GAUZE/BANDAGES/DRESSINGS) ×2 IMPLANT
BNDG COHESIVE 6X5 TAN STRL LF (GAUZE/BANDAGES/DRESSINGS) IMPLANT
BNDG CONFORM 3 STRL LF (GAUZE/BANDAGES/DRESSINGS) ×2 IMPLANT
BNDG ESMARK 6X9 LF (GAUZE/BANDAGES/DRESSINGS)
CHLORAPREP W/TINT 26ML (MISCELLANEOUS) ×2 IMPLANT
COVER BACK TABLE 60X90IN (DRAPES) ×2 IMPLANT
CUFF TOURNIQUET SINGLE 24IN (TOURNIQUET CUFF) IMPLANT
CUFF TOURNIQUET SINGLE 34IN LL (TOURNIQUET CUFF) ×2 IMPLANT
DRAPE EXTREMITY T 121X128X90 (DRAPE) ×2 IMPLANT
DRAPE OEC MINIVIEW 54X84 (DRAPES) ×2 IMPLANT
DRAPE U-SHAPE 47X51 STRL (DRAPES) ×2 IMPLANT
DRSG MEPITEL 4X7.2 (GAUZE/BANDAGES/DRESSINGS) ×2 IMPLANT
DRSG PAD ABDOMINAL 8X10 ST (GAUZE/BANDAGES/DRESSINGS) ×2 IMPLANT
ELECT REM PT RETURN 9FT ADLT (ELECTROSURGICAL) ×2
ELECTRODE REM PT RTRN 9FT ADLT (ELECTROSURGICAL) ×1 IMPLANT
GAUZE SPONGE 4X4 12PLY STRL (GAUZE/BANDAGES/DRESSINGS) ×2 IMPLANT
GLOVE BIO SURGEON STRL SZ8 (GLOVE) ×2 IMPLANT
GLOVE BIOGEL PI IND STRL 7.0 (GLOVE) ×1 IMPLANT
GLOVE BIOGEL PI IND STRL 8 (GLOVE) ×3 IMPLANT
GLOVE BIOGEL PI INDICATOR 7.0 (GLOVE) ×1
GLOVE BIOGEL PI INDICATOR 8 (GLOVE) ×3
GLOVE ECLIPSE 6.5 STRL STRAW (GLOVE) ×2 IMPLANT
GLOVE ECLIPSE 8.0 STRL XLNG CF (GLOVE) ×2 IMPLANT
GOWN STRL REUS W/ TWL LRG LVL3 (GOWN DISPOSABLE) ×1 IMPLANT
GOWN STRL REUS W/ TWL XL LVL3 (GOWN DISPOSABLE) ×2 IMPLANT
GOWN STRL REUS W/TWL LRG LVL3 (GOWN DISPOSABLE) ×1
GOWN STRL REUS W/TWL XL LVL3 (GOWN DISPOSABLE) ×2
K-WIRE COCR 0.9X95 (WIRE) ×2
K-WIRE TROC 1.25X150 (WIRE) ×2
KWIRE COCR 0.9X95 (WIRE) ×1 IMPLANT
KWIRE TROC 1.25X150 (WIRE) ×1 IMPLANT
NEEDLE HYPO 25X1 1.5 SAFETY (NEEDLE) IMPLANT
NS IRRIG 1000ML POUR BTL (IV SOLUTION) ×2 IMPLANT
PACK BASIN DAY SURGERY FS (CUSTOM PROCEDURE TRAY) ×2 IMPLANT
PAD CAST 4YDX4 CTTN HI CHSV (CAST SUPPLIES) ×1 IMPLANT
PADDING CAST ABS 4INX4YD NS (CAST SUPPLIES)
PADDING CAST ABS COTTON 4X4 ST (CAST SUPPLIES) IMPLANT
PADDING CAST COTTON 4X4 STRL (CAST SUPPLIES) ×1
PENCIL BUTTON HOLSTER BLD 10FT (ELECTRODE) ×2 IMPLANT
SANITIZER HAND PURELL 535ML FO (MISCELLANEOUS) ×2 IMPLANT
SCREW CANNULATED NS 4MMX36MM (Screw) ×2 IMPLANT
SCREW CORT 3.5X40 (Screw) ×2 IMPLANT
SCREW HCS TWIST-OFF 2.0X12MM (Screw) ×4 IMPLANT
SCREW HCS TWIST-OFF 2.0X14MM (Screw) ×2 IMPLANT
SCREW MAX VPC  2.5X20 (Screw) ×2 IMPLANT
SCREW MAX VPC 2.5X20 (Screw) ×2 IMPLANT
SHEET MEDIUM DRAPE 40X70 STRL (DRAPES) ×2 IMPLANT
SLEEVE SCD COMPRESS KNEE MED (MISCELLANEOUS) ×2 IMPLANT
SPONGE LAP 18X18 X RAY DECT (DISPOSABLE) ×2 IMPLANT
STOCKINETTE 6  STRL (DRAPES) ×1
STOCKINETTE 6 STRL (DRAPES) ×1 IMPLANT
SUCTION FRAZIER HANDLE 10FR (MISCELLANEOUS) ×1
SUCTION TUBE FRAZIER 10FR DISP (MISCELLANEOUS) ×1 IMPLANT
SUT ETHIBOND 3-0 V-5 (SUTURE) IMPLANT
SUT ETHILON 3 0 PS 1 (SUTURE) ×2 IMPLANT
SUT MNCRL AB 3-0 PS2 18 (SUTURE) ×2 IMPLANT
SUT VIC AB 0 SH 27 (SUTURE) IMPLANT
SUT VIC AB 2-0 SH 27 (SUTURE)
SUT VIC AB 2-0 SH 27XBRD (SUTURE) IMPLANT
SUT VICRYL 0 UR6 27IN ABS (SUTURE) IMPLANT
SYR BULB 3OZ (MISCELLANEOUS) ×2 IMPLANT
SYR CONTROL 10ML LL (SYRINGE) IMPLANT
TOWEL OR 17X24 6PK STRL BLUE (TOWEL DISPOSABLE) ×2 IMPLANT
TUBE CONNECTING 20X1/4 (TUBING) ×2 IMPLANT
UNDERPAD 30X30 (UNDERPADS AND DIAPERS) ×2 IMPLANT

## 2017-08-04 NOTE — H&P (Addendum)
History and Physical  Martha Hyracy G Winthrop HYQ:[id number] DOB: Mar 15, 1966 DOA: 08/04/2017  Referring physician: Dr. Victorino DikeHewitt PCP: Linus MakoIzaj, Julie, PA-C  Outpatient Specialists: None Patient coming from:[facility] At her baseline ambulates independently  Chief Complaint: Tachycardia   HPI: Martha Washington is a 51 y.o. female with no significant medical history who presents on 08/04/2017 tachycardia to 160s in the PACU after the surgery with reports of AF with RVR ( no EKG to confirm).   Patient presented for surgery today for left bunionectomy repair of hammertoes.  Her PACU recovery was complicated by HR to the 160s.  Per discussion with anesthesiologist patient's heart rate is in the 50s-60s in sinus rhythm throughout the surgery;however the PACU patient complained of nausea was then found to be in atrial fibrillation per report but no EKG to confirm.  Patient was given IV metoprolol 5 mg for 1 dose  Upon arrival to the floor patient's heart rate was 60 with normal sinus rhythm.  Patient states prior to today's surgery she was in her usual state of health.  She does describe a faint headache for the past few days which she took no medications.  Otherwise she denies any chest pain, palpitations, shortness of breath, cough, recent illnesses or sick contacts, lower extremity swelling, diarrhea or constipation, normal pain, fevers or chills.  Patient has no significant medical history and has never been in atrial fibrillation per her report.  Review of Systems:As mentioned in the history of present illness.Review of systems are otherwise negative Patient seen on the floor .    Past Medical History:  Diagnosis Date  . GERD (gastroesophageal reflux disease)    no meds  . Missed abortion    resolved - no surgery required  . SVD (spontaneous vaginal delivery)    x 2   Past Surgical History:  Procedure Laterality Date  . right shoulder  2010   rotator cuff repair  . ROBOTIC ASSISTED TOTAL  HYSTERECTOMY WITH BILATERAL SALPINGO OOPHERECTOMY N/A 07/13/2013   Procedure: ROBOTIC ASSISTED TOTAL HYSTERECTOMY WITH BILATERAL SALPINGECTOMY;  Surgeon: Esmeralda ArthurSandra A Rivard, MD;  Location: [facility];  Service: Gynecology;  Laterality: N/A;  . SHOULDER ARTHROSCOPY WITH ROTATOR CUFF REPAIR Right 10/17/2014   Procedure: SHOULDER ARTHROSCOPY WITH ROTATOR CUFF REPAIR;  Surgeon: Mckinley Jewelaniel Murphy, MD;  Location: [facility];  Service: Orthopedics;  Laterality: Right;  . TUBAL LIGATION    . WISDOM TOOTH EXTRACTION      Social History:  reports that  has never smoked. she has never used smokeless tobacco. She reports that she does not drink alcohol or use drugs.   Allergies  Allergen Reactions  . Augmentin [Amoxicillin-Pot Clavulanate]     Stomach pain  . Morphine And Related Nausea And Vomiting  . Oxycodone     "passed out when taking after last surgery"    Family History  Problem Relation Age of Onset  . Hypertension Mother   . Hypertension Father       Prior to Admission medications   Medication Sig Start Date End Date Taking? Authorizing Provider  cholecalciferol (VITAMIN D) 1000 units tablet Take 1,000 Units by mouth daily.   Yes [provider]  ibuprofen (ADVIL,MOTRIN) 200 MG tablet Take 800 mg by mouth every 6 (six) hours as needed for headache.   Yes [provider]  aspirin EC 81 MG tablet Take 1 tablet (81 mg total) by mouth 2 (two) times daily. 08/04/17   Jacinta Shoellis, Justin Pike, PA-C  docusate sodium (COLACE) 100  MG capsule Take 1 capsule (100 mg total) by mouth 2 (two) times daily. While taking narcotic pain medicine. 08/04/17   Jacinta Shoe, PA-C  ondansetron (ZOFRAN) 4 MG tablet Take 1 tablet (4 mg total) by mouth daily as needed for nausea or vomiting. 08/04/17   Jacinta Shoe, PA-C  senna (SENOKOT) 8.6 MG TABS tablet Take 2 tablets (17.2 mg total) by mouth 2 (two) times daily. 08/04/17   Jacinta Shoe, PA-C  traMADol (ULTRAM) 50 MG tablet  Take 1 tablet (50 mg total) by mouth every 4 (four) hours as needed for moderate pain. For no more than 5 days. 08/04/17   Jacinta Shoe, PA-C    Physical Exam: BP 119/77 (BP Location: Left Arm)   Pulse 62   Temp 98 F (36.7 C) (Oral)   Resp 17   Ht 5\' 5"  (1.651 m)   Wt 79.4 kg (175 lb)   LMP 07/05/2013   SpO2 98%   BMI 29.12 kg/m   General: Lying in bed in Appear in no distress Eyes: EOMI, anicteric ENT: Oral Mucosa clear moist Neck: normal, supple, no masses, no JVD Cardiovascular: regular rate and rhythm, no Murmurs, rubs or gallops. Peripheral Pulses present.  No edema Respiratory: Normal respiratory effort, clear breath sounds on auscultation bilaterally, no rales wheezes or rhonchi Abdomen: soft, non-distended, non-tender, normal bowel sounds, no guarding or rebound tenderness Skin:  No Rash Musculoskeletal: Left foot bandaged and elevated Neurologic: Grossly no focal neuro deficit.Mental status AAOx3, speech normal, Psychiatric: Appropriate affect, and mood          Labs on Admission:  Basic Metabolic Panel: No results for input(s): NA, K, CL, CO2, GLUCOSE, BUN, CREATININE, CALCIUM, MG, PHOS in the last 168 hours. Liver Function Tests: No results for input(s): AST, ALT, ALKPHOS, BILITOT, PROT, ALBUMIN in the last 168 hours. No results for input(s): LIPASE, AMYLASE in the last 168 hours. No results for input(s): AMMONIA in the last 168 hours. CBC: No results for input(s): WBC, NEUTROABS, HGB, HCT, MCV, PLT in the last 168 hours. Cardiac Enzymes: No results for input(s): CKTOTAL, CKMB, CKMBINDEX, TROPONINI in the last 168 hours.  BNP (last 3 results) No results for input(s): BNP in the last 8760 hours.  ProBNP (last 3 results) No results for input(s): PROBNP in the last 8760 hours.  CBG: No results for input(s): GLUCAP in the last 168 hours.  Radiological Exams on Admission: No results found.   Assessment/Plan  Active Problems:   Tachycardia.  Reported concern for atrial fibrillation by PACU team with RVR however no EKG or objective data to confirm. Currently normal sinus rhythm with appropriate HR, did receive IV metoprolol 5 mg x1.  Unclear if this was truly atrial fibrillation or sinus tachycardia. With HR over 160 would be surprising to be sinus tachycardia. She would benefit from observation over the next 24 hours - obtain EKG -Evaluate TSH, CMP, Mg -A1c - Current CHA DS VASC: 0, will evaluate on lab work up above, aspirin 81 mg BID for recent surgery per surgery recs -Monitor on telemetry, for observation   S/P Left foot bunionectomy and hammertoe repair on 08/04/17 Non weight bearing per Surgeon orders will follow up 2 weeks for suture removal -Recommends aspirin 81 twice daily for DVT prophylaxis -Tylenol, tramadol (while in hospital) as needed for pain control   DVT prophylaxis: aspirin 81 mg BID Code Status: Full Code   Family Communication: Family was present at bedside and updated appropriately at the time  of interview.   Disposition Plan: monitor on telemetry, lab work up for new afib   Consults called: None   Admission status: Admitted as observation on telemetry unit.      Laverna PeaceShayla D Rashied Corallo MD [facility]  Pager [phone]  If 7PM-7AM, please contact night-coverage [email or web address] Password [email or web address]  08/04/2017, 1:17 PM

## 2017-08-04 NOTE — Transfer of Care (Signed)
Immediate Anesthesia Transfer of Care Note  Patient: Martha Washington  Procedure(s) Performed: Left modified Adin HectorMcBride and Lapidus bunion corrections; Left 2-3 Weil and hammertoe corrections (Left Foot) Left 2nd and 3rd weil osteotomy (Left Foot)  Patient Location: PACU  Anesthesia Type:GA combined with regional for post-op pain  Level of Consciousness: awake, alert , oriented and patient cooperative  Airway & Oxygen Therapy: Patient Spontanous Breathing and Patient connected to face mask oxygen  Post-op Assessment: Report given to RN and Post -op Vital signs reviewed and stable  Post vital signs: Reviewed and stable  Last Vitals:  Vitals:   08/04/17 0710 08/04/17 0715  BP: 113/67 106/64  Pulse: 61 61  Resp: 11 20  Temp:    SpO2: 100% 100%    Last Pain:  Vitals:   08/04/17 0639  TempSrc: Oral         Complications: No apparent anesthesia complications

## 2017-08-04 NOTE — Anesthesia Procedure Notes (Signed)
Anesthesia Regional Block: Popliteal block   Pre-Anesthetic Checklist: ,, timeout performed, Correct Patient, Correct Site, Correct Laterality, Correct Procedure, Correct Position, site marked, Risks and benefits discussed,  Surgical consent,  Pre-op evaluation,  At surgeon's request and post-op pain management  Laterality: Left  Prep: chloraprep       Needles:  Injection technique: Single-shot  Needle Type: Echogenic Stimulator Needle          Additional Needles:   Procedures:, nerve stimulator,,,,,,,   Nerve Stimulator or Paresthesia:  Response: plantar flexion of foot, 0.45 mA,   Additional Responses:   Narrative:  Start time: 08/04/2017 7:07 AM End time: 08/04/2017 7:13 AM Injection made incrementally with aspirations every 5 mL.  Performed by: Personally  Anesthesiologist: Achille RichHodierne, Ormand Senn, MD  Additional Notes: Functioning IV was confirmed and monitors were applied.  A 90mm 21ga Arrow echogenic stimulator needle was used. Sterile prep and drape,hand hygiene and sterile gloves were used.  Negative aspiration and negative test dose prior to incremental administration of local anesthetic. The patient tolerated the procedure well.  Ultrasound guidance: relevent anatomy identified, needle position confirmed, local anesthetic spread visualized around nerve(s), vascular puncture avoided.  Image printed for medical record.

## 2017-08-04 NOTE — Anesthesia Preprocedure Evaluation (Signed)
Anesthesia Evaluation  Patient identified by MRN, date of birth, ID band Patient awake    Reviewed: Allergy & Precautions, H&P , NPO status , Patient's Chart, lab work & pertinent test results  Airway Mallampati: II   Neck ROM: full    Dental   Pulmonary neg pulmonary ROS,    breath sounds clear to auscultation       Cardiovascular negative cardio ROS   Rhythm:regular Rate:Normal     Neuro/Psych    GI/Hepatic GERD  ,  Endo/Other    Renal/GU      Musculoskeletal   Abdominal   Peds  Hematology   Anesthesia Other Findings   Reproductive/Obstetrics                             Anesthesia Physical Anesthesia Plan  ASA: II  Anesthesia Plan: General   Post-op Pain Management:  Regional for Post-op pain   Induction: Intravenous  PONV Risk Score and Plan: 3 and Ondansetron, Dexamethasone, Midazolam and Treatment may vary due to age or medical condition  Airway Management Planned: LMA  Additional Equipment:   Intra-op Plan:   Post-operative Plan:   Informed Consent: I have reviewed the patients History and Physical, chart, labs and discussed the procedure including the risks, benefits and alternatives for the proposed anesthesia with the patient or authorized representative who has indicated his/her understanding and acceptance.     Plan Discussed with: CRNA, Anesthesiologist and Surgeon  Anesthesia Plan Comments:         Anesthesia Quick Evaluation

## 2017-08-04 NOTE — Discharge Instructions (Addendum)
Toni ArthursJohn Hewitt, MD [facility]  Please read the following information regarding your care after surgery.  Medications  You only need a prescription for the narcotic pain medicine (ex. oxycodone, Percocet, Norco).  All of the other medicines listed below are available over the counter. X Aleve 2 pills twice a day for the first 3 days after surgery. X acetominophen (Tylenol) 650 mg every 4-6 hours as you need for minor to moderate pain X oxycodone as prescribed for severe pain X zofran as prescribed for nausea/vomiting  Narcotic pain medicine (ex. oxycodone, Percocet, Vicodin) will cause constipation.  To prevent this problem, take the following medicines while you are taking any pain medicine. X docusate sodium (Colace) 100 mg twice a day X senna (Senokot) 2 tablets twice a day  X To help prevent blood clots, take a baby aspirin (81 mg) twice a day for two weeks after surgery.  You should also get up every hour while you are awake to move around.    Weight Bearing X Do not bear any weight on the operated leg or foot.  Cast / Splint / Dressing X Keep your splint, cast or dressing clean and dry.  Dont put anything (coat hanger, pencil, etc) down inside of it.  If it gets damp, use a hair dryer on the cool setting to dry it.  If it gets soaked, call the office to schedule an appointment for a cast change.  After your dressing, cast or splint is removed; you may shower, but do not soak or scrub the wound.  Allow the water to run over it, and then gently pat it dry.  Swelling It is normal for you to have swelling where you had surgery.  To reduce swelling and pain, keep your toes above your nose for at least 3 days after surgery.  It may be necessary to keep your foot or leg elevated for several weeks.  If it hurts, it should be elevated.  Follow Up Call my office at [phone] when you are discharged from the hospital or surgery center to schedule an appointment to be seen two  weeks after surgery.  Call my office at [phone] if you develop a fever >101.5 F, nausea, vomiting, bleeding from the surgical site or severe pain.      Post Anesthesia Home Care Instructions  Activity: Get plenty of rest for the remainder of the day. A responsible individual must stay with you for 24 hours following the procedure.  For the next 24 hours, DO NOT: -Drive a car -Advertising copywriterperate machinery -Drink alcoholic beverages -Take any medication unless instructed by your physician -Make any legal decisions or sign important papers.  Meals: Start with liquid foods such as gelatin or soup. Progress to regular foods as tolerated. Avoid greasy, spicy, heavy foods. If nausea and/or vomiting occur, drink only clear liquids until the nausea and/or vomiting subsides. Call your physician if vomiting continues.  Special Instructions/Symptoms: Your throat may feel dry or sore from the anesthesia or the breathing tube placed in your throat during surgery. If this causes discomfort, gargle with warm salt water. The discomfort should disappear within 24 hours.  If you had a scopolamine patch placed behind your ear for the management of post- operative nausea and/or vomiting:  1. The medication in the patch is effective for 72 hours, after which it should be removed.  Wrap patch in a tissue and discard in the trash. Wash hands thoroughly with soap and water. 2. You may remove the patch  earlier than 72 hours if you experience unpleasant side effects which may include dry mouth, dizziness or visual disturbances. 3. Avoid touching the patch. Wash your hands with soap and water after contact with the patch.     Regional Anesthesia Blocks  1. Numbness or the inability to move the "blocked" extremity may last from 3-48 hours after placement. The length of time depends on the medication injected and your individual response to the medication. If the numbness is not going away after 48 hours, call your  surgeon.  2. The extremity that is blocked will need to be protected until the numbness is gone and the  Strength has returned. Because you cannot feel it, you will need to take extra care to avoid injury. Because it may be weak, you may have difficulty moving it or using it. You may not know what position it is in without looking at it while the block is in effect.  3. For blocks in the legs and feet, returning to weight bearing and walking needs to be done carefully. You will need to wait until the numbness is entirely gone and the strength has returned. You should be able to move your leg and foot normally before you try and bear weight or walk. You will need someone to be with you when you first try to ensure you do not fall and possibly risk injury.  4. Bruising and tenderness at the needle site are common side effects and will resolve in a few days.  5. Persistent numbness or new problems with movement should be communicated to the surgeon or the [facility] [phone])/ [facility] [place] [phone]).

## 2017-08-04 NOTE — Anesthesia Postprocedure Evaluation (Signed)
Anesthesia Post Note  Patient: Martha Washington  Procedure(s) Performed: Left modified Adin HectorMcBride and Lapidus bunion corrections; Left 2-3 Weil and hammertoe corrections (Left Foot) Left 2nd and 3rd weil osteotomy (Left Foot)     Patient location during evaluation: PACU Anesthesia Type: General and Regional Level of consciousness: awake and alert Pain management: pain level controlled Vital Signs Assessment: post-procedure vital signs reviewed and stable Respiratory status: spontaneous breathing, nonlabored ventilation, respiratory function stable and patient connected to nasal cannula oxygen Cardiovascular status: blood pressure returned to baseline and tachycardic (Pt had episode of atrial fibrillation with RVR in PACU) Postop Assessment: no apparent nausea or vomiting Anesthetic complications: no Comments: While in PACU, she developed AF with RVR up to a HR of 160.  A total of 5 mg of metoprolol was given and she returned to NSR.  This is new onset AF and she will be admitted for workup and observation under care of the hospitalist service.      Last Vitals:  Vitals:   08/04/17 1145 08/04/17 1200  BP: 108/67 (!) 127/92  Pulse: 63 78  Resp: 14 18  Temp:    SpO2: 99% 100%    Last Pain:  Vitals:   08/04/17 1200  TempSrc:   PainSc: 1                  Saphyra Hutt S

## 2017-08-04 NOTE — Op Note (Signed)
08/04/2017  9:07 AM  PATIENT:  Martha Washington  51 y.o. female  PRE-OPERATIVE DIAGNOSIS:  1.  Painful left foot bunion deformity      2.  Left 2nd and 3rd hammertoes      3.  Left foot metatarsalgia  POST-OPERATIVE DIAGNOSIS:  same  Procedure(s): 1.  Left modified McBride bunionectomy 2.  Left foot Lapidus arthrodesis of the 1st TMT joint (separate incision) 3.  Left 2nd and 3rd MT Weil osteotomies (separate incisions) 4.  Left 2nd and 3rd hammertoe corrections (separate incisions) 5.  Left foot Ap, lateral and oblique xrays  SURGEON:  Toni ArthursJohn Tamey Wanek, MD  ASSISTANT: Alfredo MartinezJustin Ollis, PA-C  ANESTHESIA:   General, regional  EBL:  minimal   TOURNIQUET:   Total Tourniquet Time Documented: Thigh (Left) - 65 minutes Total: Thigh (Left) - 65 minutes  COMPLICATIONS:  None apparent  DISPOSITION:  Extubated, awake and stable to recovery.  INDICATION FOR PROCEDURE: The patient is a 51 year old female with a long history of left forefoot pain.  She has a severe bunion deformity as well as metatarsalgia and hammertoes at the second and third toes.  She has failed nonoperative treatment to date including activity modification, oral anti-inflammatories and shoe wear modification.  She presents now for surgical treatment.The risks and benefits of the alternative treatment options have been discussed in detail.  The patient wishes to proceed with surgery and specifically understands risks of bleeding, infection, nerve damage, blood clots, need for additional surgery, amputation and death.  PROCEDURE IN DETAIL:  After pre operative consent was obtained, and the correct operative site was identified, the patient was brought to the operating room and placed supine on the OR table.  Anesthesia was administered.  Pre-operative antibiotics were administered.  A surgical timeout was taken.  The left lower extremity was prepped and draped in standard sterile fashion with a tourniquet around the thigh.  The  extremity was exsanguinated and the tourniquet was inflated to 250 mmHg.  A longitudinal incision was made at the dorsum of the first web space.  Dissection was carried down through the skin and subtendinous tissues.  The intermetatarsal ligament was divided under direct vision.  An arthrotomy was then made between the lateral sesamoid and metatarsal head.  Small perforations were made in the lateral joint capsule.  Hallux could then be positioned in 20 degrees of varus passively.  Attention was then turned to the medial eminence where a longitudinal incision was made.  Dissection was carried down through the subtendinous tissues in the medial joint capsule was incised and elevated plantarly and dorsally.  The medial eminence was resected in line with the metatarsal shaft.  Attention was turned to the dorsum of the midfoot where a longitudinal incision was made over the first tarsometatarsal joint.  The interval between the EHL and EHB tendons was developed and the first tarsometatarsal joint capsule was incised and elevated.  An oscillating saw was used to resect the remaining articular cartilage and subchondral bone from both sides of the joint.  The cut was beveled to take more bone laterally than medially to correct the intermetatarsal angle.  The arthrodesis was then fixed with a 4 mm partially-threaded cannulated screw and a 3.5 mm fully threaded Biomet LPS screw.  Radiographs showed appropriate alignment of the hardware and appropriate correction of the bunion deformity.  Attention was then turned to the second MTP joint.  The metatarsal head was exposed and the extensor tendons were protected.  A Weil osteotomy was  made removing a small slice of bone distally.  The head was allowed to retract proximally and was fixed with a 2 mm Biomet FRS screw.  The medial collateral ligament was released in order to correct the angular deformity.  A 0 Vicryl box suture was placed in the lateral collateral ligament  through a separate incision at the second webspace.  The third metatarsal head was then exposed and again a Weil osteotomy made in the same fashion.  It was also fixed with a 2 mm screw.  Attention was then turned to the second toe.  A transverse incision was made across the PIP joint.  Dissection was carried down through the subtenons tissues and the extensor mechanism.  The head of the proximal phalanx was resected followed by the base of the middle phalanx.  The joint was reduced and fixed with a 2.5 mm Biomet VBC screw.  Same procedure was then performed through a separate incision for the third toe.  Final AP, lateral and oblique radiographs show appropriate position and length of all hardware in appropriate appropriate correction of the bunion, second and third hammertoe deformities.  The lateral collateral ligament at the second MTP joint was then repaired with the previously placed suture.  Wounds were all irrigated and closed with Monocryl and nylon.  The medial joint capsule had been repaired with indicating sutures of 2-0 Vicryl.  Sterile dressings were applied followed by well-padded short leg splint.  The tourniquet was released after application of the dressings at 65 minutes.  The patient was awakened from anesthesia and transported to the recovery room in stable condition.   FOLLOW UP PLAN: Nonweightbearing on the left lower extremity.  Follow-up in the office in 2 weeks for suture removal and conversion to a short leg cast.  She will also initiate plantar flexion stretching of the lesser toes at that time.  Aspirin 81 mg p.o. twice daily for DVT prophylaxis.   RADIOGRAPHS: AP, oblique and lateral radiographs of the left foot are obtained intraoperatively.  These show interval correction of bunion and hammertoe deformities.  Hardware is appropriately positioned and of the appropriate lengths.    Alfredo MartinezJustin Ollis PA-C was present and scrubbed for the duration of the operative case. His  assistance assistance was essential in positioning the patient, prepping and draping, gaining maintaining exposure, performing the operation, closing and dressing the wounds and applying the splint.

## 2017-08-04 NOTE — Progress Notes (Signed)
Assisted Dr. Hodierne with left, ultrasound guided, popliteal block. Side rails up, monitors on throughout procedure. See vital signs in flow sheet. Tolerated Procedure well. 

## 2017-08-04 NOTE — Anesthesia Procedure Notes (Signed)
Procedure Name: LMA Insertion Date/Time: 08/04/2017 7:35 AM Performed by: Sheryn BisonBlocker, Atalya Dano D, CRNA Pre-anesthesia Checklist: Patient identified, Emergency Drugs available, Suction available and Patient being monitored Patient Re-evaluated:Patient Re-evaluated prior to induction Oxygen Delivery Method: Circle system utilized Preoxygenation: Pre-oxygenation with 100% oxygen Induction Type: IV induction Ventilation: Mask ventilation without difficulty LMA: LMA inserted LMA Size: 4.0 Number of attempts: 1 Airway Equipment and Method: Bite block Placement Confirmation: positive ETCO2 Tube secured with: Tape Dental Injury: Teeth and Oropharynx as per pre-operative assessment

## 2017-08-04 NOTE — H&P (Signed)
Martha Washington is an 51 y.o. female.   Chief Complaint: left foot pain HPI:  51 y/o female with painful left bunion and 2nd and 3rd hammertoes.  She has failed non op treatment and presents now for surgery.  Past Medical History:  Diagnosis Date  . GERD (gastroesophageal reflux disease)    no meds  . Missed abortion    resolved - no surgery required  . SVD (spontaneous vaginal delivery)    x 2    Past Surgical History:  Procedure Laterality Date  . right shoulder  2010   rotator cuff repair  . ROBOTIC ASSISTED TOTAL HYSTERECTOMY WITH BILATERAL SALPINGO OOPHERECTOMY N/A 07/13/2013   Procedure: ROBOTIC ASSISTED TOTAL HYSTERECTOMY WITH BILATERAL SALPINGECTOMY;  Surgeon: Esmeralda ArthurSandra A Rivard, MD;  Location: [facility];  Service: Gynecology;  Laterality: N/A;  . SHOULDER ARTHROSCOPY WITH ROTATOR CUFF REPAIR Right 10/17/2014   Procedure: SHOULDER ARTHROSCOPY WITH ROTATOR CUFF REPAIR;  Surgeon: Mckinley Jewelaniel Murphy, MD;  Location: [facility];  Service: Orthopedics;  Laterality: Right;  . TUBAL LIGATION    . WISDOM TOOTH EXTRACTION      Family History  Problem Relation Age of Onset  . Hypertension Mother   . Hypertension Father    Social History:  reports that  has never smoked. she has never used smokeless tobacco. She reports that she does not drink alcohol or use drugs.  Allergies:  Allergies  Allergen Reactions  . Augmentin [Amoxicillin-Pot Clavulanate]     Stomach pain  . Morphine And Related Nausea And Vomiting  . Oxycodone     "passed out when taking after last surgery"    Medications Prior to Admission  Medication Sig Dispense Refill  . cholecalciferol (VITAMIN D) 1000 units tablet Take 1,000 Units by mouth daily.      No results found for this or any previous visit (from the past 48 hour(s)). No results found.  ROS  No recent f/c/n/v/wt loss  Blood pressure 106/64, pulse 61, temperature 97.8 F (36.6 C), temperature source Oral, resp. rate 20, height 5\' 5"   (1.651 m), weight 79.4 kg (175 lb), last menstrual period 07/05/2013, SpO2 100 %. Physical Exam  wn wd woman in nad.  A and O x 4.  Mood and affect normal.  EOMI.  resp unlabored.  L foot with moderate bunion.  2nd and 3rd hammertoes cannot be passively corrected.  Brisk cap refill in the toes.  Sens to LT intact at the forefoot dorsally and plantarly.  5/5 strength in PF and DF of the toes.  Assessment/Plan L bunion and 2-3 hammertoes with metatarsalgia - to OR for lapidus arthrodesis of the 1st TMT joint, modified McBride bunionectomy, 2-3 MT Weil osteotomies and hammertoe corrections.  The risks and benefits of the alternative treatment options have been discussed in detail.  The patient wishes to proceed with surgery and specifically understands risks of bleeding, infection, nerve damage, blood clots, need for additional surgery, amputation and death.   Toni ArthursHEWITT, Joshus Rogan, MD 08/04/2017, 7:16 AM

## 2017-08-04 NOTE — Plan of Care (Signed)
Relatively healthy 51 year old female presented for today surgery for left bunionectomy and repair of hammertoes.  PACU recovery was complicated by new onset atrial fibrillation with RVR to the 160s.  During the surgerypatient was in normal sinus rhythm with heart rate ranging 50-60s per report.  Patient now with heart rate in 120s-130s but otherwise hemodynamically stable with normal blood pressure.  Her Only Complaint is Nausea.  Recommended PACU to Treat with IV Metoprolol 5-10 mg boluses with goal HR < 110, while closely monitoring blood pressure. Awaiting Admission to Hospital for Observation.  Martha PeaceShayla D Nettey [facility]

## 2017-08-04 NOTE — Progress Notes (Signed)
Orthopedic Tech Progress Note Patient Details:  Martha Washington [birth year] [id number]  Ortho Devices Type of Ortho Device: Crutches Ortho Device/Splint Interventions: Application   Post Interventions Patient Tolerated: Well Instructions Provided: Care of device   Nikki DomCrawford, Jennilee Demarco 08/04/2017, 3:02 PM Viewed order from doctor's order list

## 2017-08-05 ENCOUNTER — Other Ambulatory Visit: Payer: Self-pay | Admitting: Medical

## 2017-08-05 ENCOUNTER — Encounter (HOSPITAL_BASED_OUTPATIENT_CLINIC_OR_DEPARTMENT_OTHER): Payer: Self-pay | Admitting: Orthopedic Surgery

## 2017-08-05 DIAGNOSIS — Z9889 Other specified postprocedural states: Secondary | ICD-10-CM

## 2017-08-05 DIAGNOSIS — I4891 Unspecified atrial fibrillation: Secondary | ICD-10-CM

## 2017-08-05 DIAGNOSIS — M21612 Bunion of left foot: Secondary | ICD-10-CM | POA: Diagnosis not present

## 2017-08-05 LAB — HIV ANTIBODY (ROUTINE TESTING W REFLEX): HIV Screen 4th Generation wRfx: NONREACTIVE

## 2017-08-05 NOTE — Discharge Summary (Signed)
Discharge Summary  Martha Washington ZOX:[id number] DOB: 1966/06/14  PCP: Linus Mako, PA-C  Admit date: 08/04/2017 Discharge date: 08/05/2017  Time spent: >10mins, more than 50% time spent on coordination of care.  Recommendations for Outpatient Follow-up:  1. F/u with cardiology,  2. F/u with orthopedics  Discharge Diagnoses:  Active Hospital Problems   Diagnosis Date Noted  . Atrial fibrillation with RVR (HCC)   . Tachycardia 08/04/2017  . S/P bunionectomy 08/04/2017    Resolved Hospital Problems  No resolved problems to display.    Discharge Condition: stable  Diet recommendation: heart healthy  Filed Weights   07/27/17 1109 08/04/17 0639  Weight: 77.1 kg (170 lb) 79.4 kg (175 lb)    History of present illness: ( per admitting MD Dr Roberto Scales) Referring physician: Dr. Victorino Dike PCP: Linus Mako, PA-C  Outpatient Specialists: None Patient coming from:[facility] At her baseline ambulates independently  Chief Complaint: Tachycardia   HPI: Martha Washington is a 51 y.o. female with no significant medical history who presents on 08/04/2017 tachycardia to 160s in the PACU after the surgery with reports of AF with RVR ( no EKG to confirm).   Patient presented for surgery today for left bunionectomy repair of hammertoes.  Her PACU recovery was complicated by HR to the 160s.  Per discussion with anesthesiologist patient's heart rate is in the 50s-60s in sinus rhythm throughout the surgery;however the PACU patient complained of nausea was then found to be in atrial fibrillation per report but no EKG to confirm.  Patient was given IV metoprolol 5 mg for 1 dose  Upon arrival to the floor patient's heart rate was 60 with normal sinus rhythm.  Patient states prior to today's surgery she was in her usual state of health.  She does describe a faint headache for the past few days which she took no medications.  Otherwise she denies any chest pain, palpitations, shortness  of breath, cough, recent illnesses or sick contacts, lower extremity swelling, diarrhea or constipation, normal pain, fevers or chills.  Patient has no significant medical history and has never been in atrial fibrillation per her report.  Review of Systems:As mentioned in the history of present illness.Review of systems are otherwise negative Patient seen on the floor .    Hospital Course:  Active Problems:   Tachycardia   S/P bunionectomy   Atrial fibrillation with RVR (HCC)   PAF with afib/RVR post op: New diagnosis, low chadsvasc score She converted back to NSR with iv lopressor 5mg  x1, tsh wnl, mag/k wnl. I have discussed case with Cardiology.  cardiology recommend ongoing asa, can discharge home with outpatient echo and cardiology follow up.  L bunion and 2-3 hammertoes with metatarsalgia: Procedure(s) Performed: (by ortho Dr Victorino Dike) Left modified Adin Hector and Lapidus bunion corrections; Left 2-3 Weil and hammertoe corrections (Left Foot) Left 2nd and 3rd weil osteotomy (Left Foot)    Procedures:  As above  Consultations:  ortho  cardiology  Discharge Exam: BP 107/60 (BP Location: Right Arm)   Pulse 62   Temp 98.7 F (37.1 C) (Oral)   Resp 13   Ht 5\' 5"  (1.651 m)   Wt 79.4 kg (175 lb)   LMP 07/05/2013   SpO2 97%   BMI 29.12 kg/m   General: NAD Cardiovascular: RRR Respiratory: CTABL Left foot : post op changed/dressing /badage in place.  Discharge Instructions You were cared for by a hospitalist during your hospital stay. If you have any questions about your discharge medications or  the care you received while you were in the hospital after you are discharged, you can call the unit and asked to speak with the hospitalist on call if the hospitalist that took care of you is not available. Once you are discharged, your primary care physician will handle any further medical issues. Please note that NO REFILLS for any discharge medications will be authorized  once you are discharged, as it is imperative that you return to your primary care physician (or establish a relationship with a primary care physician if you do not have one) for your aftercare needs so that they can reassess your need for medications and monitor your lab values.  Discharge Instructions    Call MD / Call 911   Complete by:  As directed    If you experience chest pain or shortness of breath, CALL 911 and be transported to the hospital emergency room.  If you develope a fever above 101 F, pus (white drainage) or increased drainage or redness at the wound, or calf pain, call your surgeon's office.   Constipation Prevention   Complete by:  As directed    Drink plenty of fluids.  Prune juice may be helpful.  You may use a stool softener, such as Colace (over the counter) 100 mg twice a day.  Use MiraLax (over the counter) for constipation as needed.   Diet - low sodium heart healthy   Complete by:  As directed    Increase activity slowly   Complete by:  As directed    Increase activity slowly as tolerated   Complete by:  As directed    Non weight bearing   Complete by:  As directed    Laterality:  left   Extremity:  Lower     Allergies as of 08/05/2017      Reactions   Augmentin [amoxicillin-pot Clavulanate]    Stomach pain   Morphine And Related Nausea And Vomiting   Oxycodone    "passed out when taking after last surgery"      Medication List    TAKE these medications   aspirin EC 81 MG tablet Take 1 tablet (81 mg total) by mouth 2 (two) times daily.   cholecalciferol 1000 units tablet Commonly known as:  VITAMIN D Take 1,000 Units by mouth daily.   docusate sodium 100 MG capsule Commonly known as:  COLACE Take 1 capsule (100 mg total) by mouth 2 (two) times daily. While taking narcotic pain medicine.   ibuprofen 200 MG tablet Commonly known as:  ADVIL,MOTRIN Take 800 mg by mouth every 6 (six) hours as needed for headache.   ondansetron 4 MG  tablet Commonly known as:  ZOFRAN Take 1 tablet (4 mg total) by mouth daily as needed for nausea or vomiting.   senna 8.6 MG Tabs tablet Commonly known as:  SENOKOT Take 2 tablets (17.2 mg total) by mouth 2 (two) times daily.   traMADol 50 MG tablet Commonly known as:  ULTRAM Take 1 tablet (50 mg total) by mouth every 4 (four) hours as needed for moderate pain. For no more than 5 days.            Durable Medical Equipment  (From admission, onward)        Start     Ordered   08/04/17 1321  For home use only DME Crutches  Once     08/04/17 1320       Discharge Care Instructions  (From admission, onward)  Start     Ordered   08/04/17 0000  Non weight bearing    Question Answer Comment  Laterality left   Extremity Lower      08/04/17 0903     Allergies  Allergen Reactions  . Augmentin [Amoxicillin-Pot Clavulanate]     Stomach pain  . Morphine And Related Nausea And Vomiting  . Oxycodone     "passed out when taking after last surgery"   Follow-up Information    Toni ArthursHewitt, John, MD. Schedule an appointment as soon as possible for a visit in 2 week(s).   Specialty:  Orthopedic Surgery Contact information: [address] [place] [address] [phone]        [facility] [address] Follow up on 08/22/2017.   Specialty:  Cardiology Why:  Echocardiogram appointment at 9:30am Contact information: [address], [address] [place] [id number] [phone]       Laurann Montanaunn, Dayna N, PA-C Follow up on 08/29/2017.   Specialties:  Cardiology, Radiology Why:  Appointment to review your Echocardiogram results and follow-up post-hospitalization. Please arrive at 8:15am for an 8:30am appointment.  Contact information: [address] [place] [address] [phone]            The results of significant diagnostics from this hospitalization (including imaging, microbiology, ancillary and  laboratory) are listed below for reference.    Significant Diagnostic Studies: No results found.  Microbiology: No results found for this or any previous visit (from the past 240 hour(s)).   Labs: Basic Metabolic Panel: Recent Labs  Lab 08/04/17 1317  NA 139  K 3.8  CL 105  CO2 26  GLUCOSE 121*  BUN 9  CREATININE 0.62  CALCIUM 9.3  MG 1.8   Liver Function Tests: Recent Labs  Lab 08/04/17 1317  AST 27  ALT 17  ALKPHOS 58  BILITOT 0.6  PROT 6.8  ALBUMIN 3.8   No results for input(s): LIPASE, AMYLASE in the last 168 hours. No results for input(s): AMMONIA in the last 168 hours. CBC: Recent Labs  Lab 08/04/17 1317  WBC 7.5  NEUTROABS 6.9  HGB 14.2  HCT 43.1  MCV 86.4  PLT 227   Cardiac Enzymes: No results for input(s): CKTOTAL, CKMB, CKMBINDEX, TROPONINI in the last 168 hours. BNP: BNP (last 3 results) No results for input(s): BNP in the last 8760 hours.  ProBNP (last 3 results) No results for input(s): PROBNP in the last 8760 hours.  CBG: No results for input(s): GLUCAP in the last 168 hours.     Signed:  Albertine GratesFang Kieran Arreguin MD, PhD  [facility] 08/05/2017, 1:25 PM

## 2017-08-05 NOTE — Progress Notes (Signed)
Patient is ready for discharge. Patient has had all questions answered regarding her discharge. Patient has all of her belongings. Patient IV has been DC'd and Telemetry has been DC'd. Patient will leave unit in wheelchair and meet husband at the entrance.

## 2017-08-05 NOTE — Consult Note (Signed)
Cardiology Consultation:   Patient ID: RAEANN OFFNER; [id number]; 26-Mar-1966   Admit date: 08/04/2017 Date of Consult: 08/05/2017  Primary Care Provider: Linus Mako, PA-C Primary Cardiologist: none Primary Electrophysiologist:  none   Patient Profile:   Martha Washington is a 51 y.o. female with no significant past medical history who is being seen today for the evaluation of atrial fibrillation following bunionectomy surgery 08/04/17 at the request of Dr. Roda Shutters.  History of Present Illness:   Ms. Rodenberg is a 51yo F with no significant PMH who presented 08/04/17 for L bunionectomy. Patient states she was in her usual state of health prior to surgery. Denies history of HTN, DM, HF, CVA. States she was in PACU recovery when the nurses became concerned about her elevated HR. She denied feeling like her heart was racing at that time or feeling palpitations. Noted some nausea which she attributed to anesthesia. Denied CP, SOB, dizziness, or lightheadedness at the time of elevated HR. Denied history of anginal symptoms prior to surgery.   In PACU recovery, patient was noted to have an elevated HR to 160s. EKG (08/04/17 at 10:09) confirmed her to be in atrial fibrillation. She received IV metoprolol 5mg  with improvement of her HR. Repeat EKG (08/04/17 at 13:59) confirmed patient back in NSR with rate 71, non-ischemic. Patient was admitted to hospitalist team for observation. Patient has remained in NSR on telemetry. HgbA1C 5.2. TSH wnl. BP well controlled.   Past Medical History:  Diagnosis Date  . GERD (gastroesophageal reflux disease)    no meds  . High cholesterol   . Missed abortion    resolved - no surgery required  . New onset atrial fibrillation (HCC) 08/04/2017   with RVR to the 160s; S/P hammertoe OR/notes 08/04/2017  . SVD (spontaneous vaginal delivery) X 2    Past Surgical History:  Procedure Laterality Date  . ABDOMINAL HYSTERECTOMY    . BUNIONECTOMY WITH HAMMERTOE  RECONSTRUCTION Right 03/2017  . BUNIONECTOMY WITH HAMMERTOE RECONSTRUCTION Left 08/04/2017  . BUNIONECTOMY WITH HAMMERTOE RECONSTRUCTION Left 08/04/2017   Procedure: Left modified McBride and Lapidus bunion corrections; Left 2-3 Weil and hammertoe corrections;  Surgeon: Toni Arthurs, MD;  Location: [facility];  Service: Orthopedics;  Laterality: Left;  . ROBOTIC ASSISTED TOTAL HYSTERECTOMY WITH BILATERAL SALPINGO OOPHERECTOMY N/A 07/13/2013   Procedure: ROBOTIC ASSISTED TOTAL HYSTERECTOMY WITH BILATERAL SALPINGECTOMY;  Surgeon: Esmeralda Arthur, MD;  Location: [facility];  Service: Gynecology;  Laterality: N/A;  . SHOULDER ARTHROSCOPY W/ ROTATOR CUFF REPAIR Right 2010  . SHOULDER ARTHROSCOPY WITH ROTATOR CUFF REPAIR Right 10/17/2014   Procedure: SHOULDER ARTHROSCOPY WITH ROTATOR CUFF REPAIR;  Surgeon: Mckinley Jewel, MD;  Location: [facility];  Service: Orthopedics;  Laterality: Right;  . TUBAL LIGATION    . WEIL OSTEOTOMY Left 08/04/2017   Procedure: Left 2nd and 3rd weil osteotomy;  Surgeon: Toni Arthurs, MD;  Location: [facility];  Service: Orthopedics;  Laterality: Left;  . WISDOM TOOTH EXTRACTION        Inpatient Medications: Scheduled Meds: . aspirin EC  81 mg Oral BID   Continuous Infusions:  PRN Meds: acetaminophen, metoprolol tartrate, ondansetron (ZOFRAN) IV, traMADol  Allergies:    Allergies  Allergen Reactions  . Augmentin [Amoxicillin-Pot Clavulanate]     Stomach pain  . Morphine And Related Nausea And Vomiting  . Oxycodone     "passed out when taking after last surgery"    Social History:   Social History   Socioeconomic History  .  Marital status: Married    Spouse name: Not on file  . Number of children: Not on file  . Years of education: Not on file  . Highest education level: Not on file  Social Needs  . Financial resource strain: Not on file  . Food insecurity - worry: Not on file  . Food insecurity - inability: Not  on file  . Transportation needs - medical: Not on file  . Transportation needs - non-medical: Not on file  Occupational History  . Not on file  Tobacco Use  . Smoking status: Never Smoker  . Smokeless tobacco: Never Used  Substance and Sexual Activity  . Alcohol use: No  . Drug use: No  . Sexual activity: Not on file  Other Topics Concern  . Not on file  Social History Narrative  . Not on file    Family History:    Family History  Problem Relation Age of Onset  . Hypertension Mother   . Hypertension Father      ROS:  Please see the history of present illness.   All other ROS reviewed and negative.     Physical Exam/Data:   Vitals:   08/04/17 1200 08/04/17 1237 08/04/17 1307 08/04/17 1939  BP: (!) 127/92 119/77  107/60  Pulse: 78 62  62  Resp: 18 17  13   Temp:  98 F (36.7 C)  98.7 F (37.1 C)  TempSrc:  Oral  Oral  SpO2: 100% 98% 96% 97%  Weight:      Height:        Intake/Output Summary (Last 24 hours) at 08/05/2017 1126 Last data filed at 08/05/2017 0900 Gross per 24 hour  Intake 830 ml  Output 500 ml  Net 330 ml   Filed Weights   07/27/17 1109 08/04/17 0639  Weight: 170 lb (77.1 kg) 175 lb (79.4 kg)   Body mass index is 29.12 kg/m.  General:  Well developed, well nourished female laying in bed in NAD HEENT: sclera anicteric  Lymph: no adenopathy Neck: no JVD Endocrine:  No thryomegaly Vascular: No carotid bruits; distal pulses 2+ on RLE, unable to assess on LLE due to cast. Normal capillary refill b/l Cardiac:  normal S1, S2; RRR; no murmur, gallops, or rubs Lungs:  clear to auscultation bilaterally, no wheezing, rhonchi or rales  Abd: NABS, soft, nontender, no hepatomegaly Ext: Trace edema of RLE Musculoskeletal:  No deformities, BUE and BLE strength normal and equal Skin: warm and dry  Neuro:  CNs 2-12 intact, no focal abnormalities noted Psych:  Normal affect   EKG:  The EKG was personally reviewed and demonstrates:  Afib RVR (08/04/17  at 10:09); repeat with NSR, non-ischemic (08/04/17 at 13:59) Telemetry:  Telemetry was personally reviewed and demonstrates:  NSR  Relevant CV Studies: None  Laboratory Data:  Chemistry Recent Labs  Lab 08/04/17 1317  NA 139  K 3.8  CL 105  CO2 26  GLUCOSE 121*  BUN 9  CREATININE 0.62  CALCIUM 9.3  GFRNONAA >60  GFRAA >60  ANIONGAP 8    Recent Labs  Lab 08/04/17 1317  PROT 6.8  ALBUMIN 3.8  AST 27  ALT 17  ALKPHOS 58  BILITOT 0.6   Hematology Recent Labs  Lab 08/04/17 1317  WBC 7.5  RBC 4.99  HGB 14.2  HCT 43.1  MCV 86.4  MCH 28.5  MCHC 32.9  RDW 13.1  PLT 227   Cardiac EnzymesNo results for input(s): TROPONINI in the last 168 hours. No  results for input(s): TROPIPOC in the last 168 hours.  BNPNo results for input(s): BNP, PROBNP in the last 168 hours.  DDimer No results for input(s): DDIMER in the last 168 hours.  Radiology/Studies:  No results found.  Assessment and Plan:   1. Afib RVR: transient following surgery. Rate control achieved with IV metoprolol and patient converted back to NSR.  - This patients CHA2DS2-VASc Score and unadjusted Ischemic Stroke Rate (% per year) is equal to 0.2 % stroke rate/year from a score of 0 Above score calculated as 1 point each if present [CHF, HTN, DM, Vascular=MI/PAD/Aortic Plaque, Age if 65-74, or Female] Above score calculated as 2 points each if present [Age > 75, or Stroke/TIA/TE] - Does not require anticoagulation at this time - Continue ASA 81mg  BID post-op per Surgery. Can continue 81mg  daily after completion of this course. - Will schedule outpatient ECHO to r/o structural abnormalities.  No need for further inpatient cardiac work-up. Can follow-up outpatient after ECHO completed (scheduled).     For questions or updates, please contact [facility] Please consult [email or web address] for contact info under Cardiology/STEMI.   Signed, Beatriz StallionKrista M. Kroeger, PA-C  08/05/2017 11:26 AM [phone]  I  have personally seen and examined this patient. I agree with the assessment and plan as outlined above. Ms. Marval Regalrillaman had onset of atrial fib with RVR following her foot surgery. She quickly converted to sinus. Her CHADSVASC score is 0. (female but under 51 yo). At this time, I would continue ASA 81 mg daily. We will arrange an echo in the office and office f/u. No indication for anti-coagulation. OK to discharge home today.   Verne CarrowChristopher Glendale Youngblood 08/05/2017 1:08 PM

## 2017-08-18 DIAGNOSIS — M2012 Hallux valgus (acquired), left foot: Secondary | ICD-10-CM | POA: Diagnosis not present

## 2017-08-22 ENCOUNTER — Other Ambulatory Visit: Payer: Self-pay

## 2017-08-22 ENCOUNTER — Ambulatory Visit (HOSPITAL_COMMUNITY): Payer: 59 | Attending: Cardiovascular Disease

## 2017-08-22 DIAGNOSIS — I4891 Unspecified atrial fibrillation: Secondary | ICD-10-CM | POA: Insufficient documentation

## 2017-08-29 ENCOUNTER — Ambulatory Visit: Payer: 59 | Admitting: Physician Assistant

## 2017-09-14 DIAGNOSIS — M79672 Pain in left foot: Secondary | ICD-10-CM | POA: Diagnosis not present

## 2017-09-14 DIAGNOSIS — M2012 Hallux valgus (acquired), left foot: Secondary | ICD-10-CM | POA: Diagnosis not present

## 2017-10-12 DIAGNOSIS — M79672 Pain in left foot: Secondary | ICD-10-CM | POA: Diagnosis not present

## 2017-11-02 DIAGNOSIS — R0602 Shortness of breath: Secondary | ICD-10-CM | POA: Diagnosis not present

## 2017-11-02 DIAGNOSIS — R635 Abnormal weight gain: Secondary | ICD-10-CM | POA: Diagnosis not present

## 2017-11-02 DIAGNOSIS — Z79899 Other long term (current) drug therapy: Secondary | ICD-10-CM | POA: Diagnosis not present

## 2017-11-02 DIAGNOSIS — R5383 Other fatigue: Secondary | ICD-10-CM | POA: Diagnosis not present

## 2017-11-30 DIAGNOSIS — Z09 Encounter for follow-up examination after completed treatment for conditions other than malignant neoplasm: Secondary | ICD-10-CM | POA: Diagnosis not present

## 2017-11-30 DIAGNOSIS — M2012 Hallux valgus (acquired), left foot: Secondary | ICD-10-CM | POA: Diagnosis not present

## 2017-11-30 DIAGNOSIS — M79672 Pain in left foot: Secondary | ICD-10-CM | POA: Diagnosis not present

## 2017-12-06 DIAGNOSIS — M5416 Radiculopathy, lumbar region: Secondary | ICD-10-CM | POA: Diagnosis not present

## 2017-12-19 DIAGNOSIS — M5416 Radiculopathy, lumbar region: Secondary | ICD-10-CM | POA: Diagnosis not present

## 2017-12-20 DIAGNOSIS — M545 Low back pain: Secondary | ICD-10-CM | POA: Diagnosis not present

## 2017-12-21 DIAGNOSIS — M5416 Radiculopathy, lumbar region: Secondary | ICD-10-CM | POA: Diagnosis not present

## 2017-12-27 DIAGNOSIS — Z6830 Body mass index (BMI) 30.0-30.9, adult: Secondary | ICD-10-CM | POA: Diagnosis not present

## 2017-12-27 DIAGNOSIS — M5126 Other intervertebral disc displacement, lumbar region: Secondary | ICD-10-CM | POA: Diagnosis not present

## 2017-12-27 DIAGNOSIS — R030 Elevated blood-pressure reading, without diagnosis of hypertension: Secondary | ICD-10-CM | POA: Diagnosis not present

## 2017-12-29 DIAGNOSIS — M5127 Other intervertebral disc displacement, lumbosacral region: Secondary | ICD-10-CM | POA: Diagnosis not present

## 2017-12-29 DIAGNOSIS — M5117 Intervertebral disc disorders with radiculopathy, lumbosacral region: Secondary | ICD-10-CM | POA: Diagnosis not present

## 2017-12-29 DIAGNOSIS — M5116 Intervertebral disc disorders with radiculopathy, lumbar region: Secondary | ICD-10-CM | POA: Diagnosis not present

## 2018-03-17 DIAGNOSIS — R232 Flushing: Secondary | ICD-10-CM | POA: Diagnosis not present

## 2018-03-17 DIAGNOSIS — Z01419 Encounter for gynecological examination (general) (routine) without abnormal findings: Secondary | ICD-10-CM | POA: Diagnosis not present

## 2018-03-17 DIAGNOSIS — G4701 Insomnia due to medical condition: Secondary | ICD-10-CM | POA: Diagnosis not present

## 2018-03-17 DIAGNOSIS — Z0000 Encounter for general adult medical examination without abnormal findings: Secondary | ICD-10-CM | POA: Diagnosis not present

## 2018-04-30 IMAGING — RF DG ESOPHAGUS
6 series · 14 of 18 positions shown · non-contrast
Comparison: None.

CLINICAL DATA: Dysphagia

EXAM:
ESOPHOGRAM / BARIUM SWALLOW / BARIUM TABLET STUDY
TECHNIQUE: Combined double contrast and single contrast examination performed
using effervescent crystals, thick barium liquid, and thin barium
liquid. The patient was observed with fluoroscopy swallowing a 13 mm
barium sulphate tablet.
FLUOROSCOPY TIME:  Fluoroscopy Time:  1 minutes 30 seconds
Radiation Exposure Index (if provided by the fluoroscopic device):
126 mGy
Number of Acquired Spot Images: 0

[Series 1: sequence · 2 of 5 frames shown (1 of 4)]
[frame 1/5]
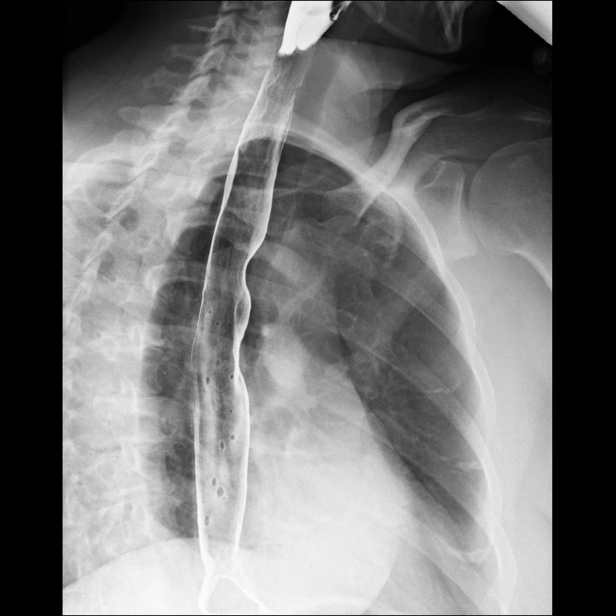
[frame 3/5]
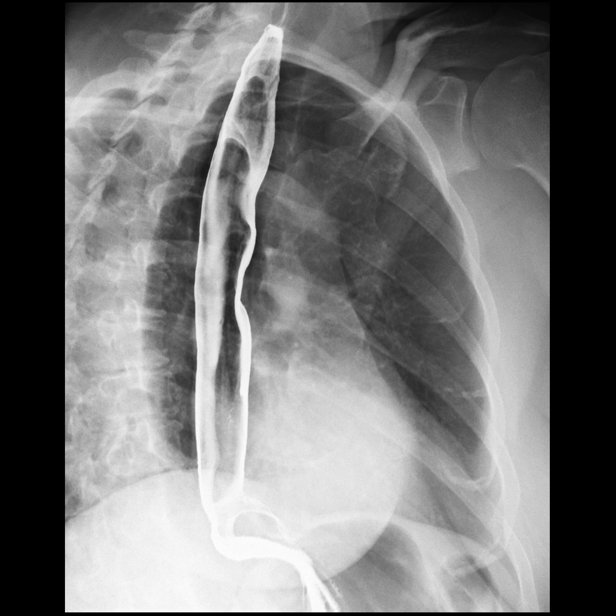

[Series 2: sequence · 3 of 21 frames shown (2 of 4)]
[frame 4/21]
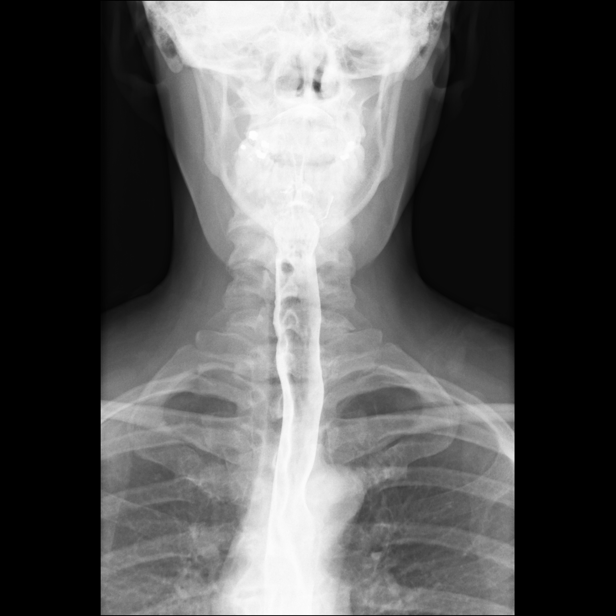
[frame 11/21]
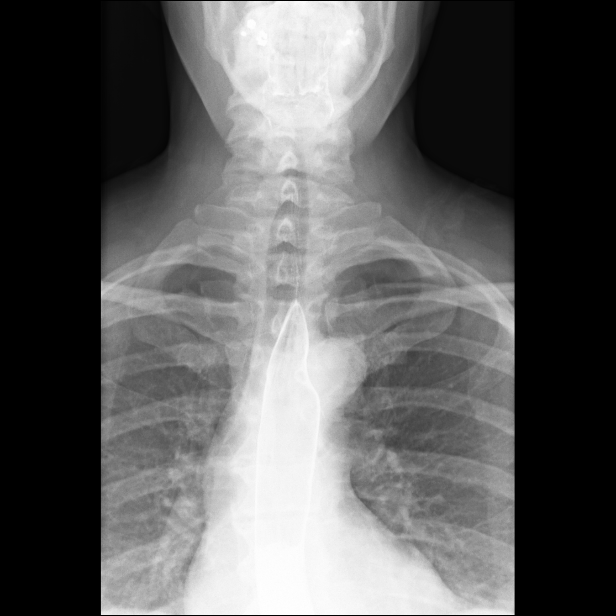
[frame 18/21]
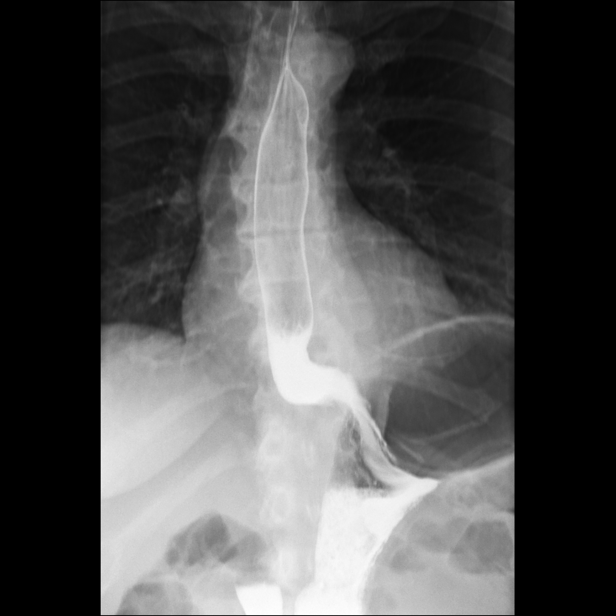

[Series 3: sequence · 3 of 12 frames shown (3 of 4)]
[frame 7/12]
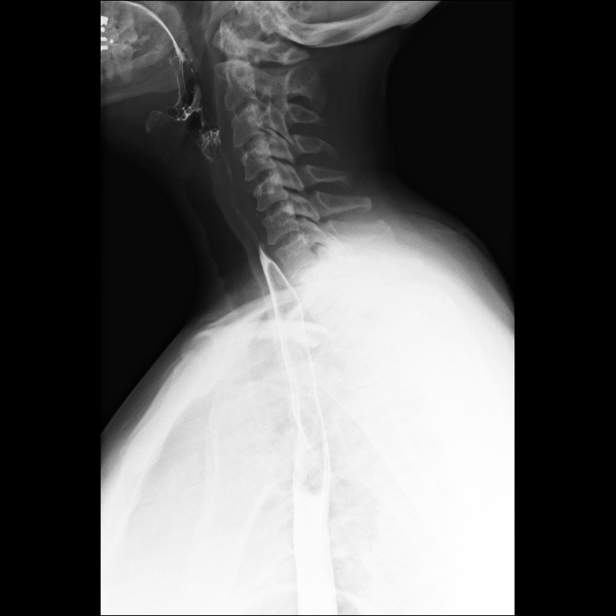
[frame 9/12]
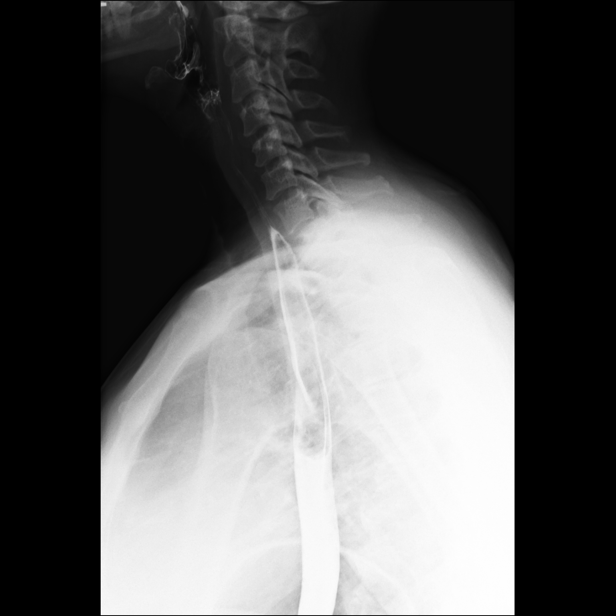
[frame 11/12]
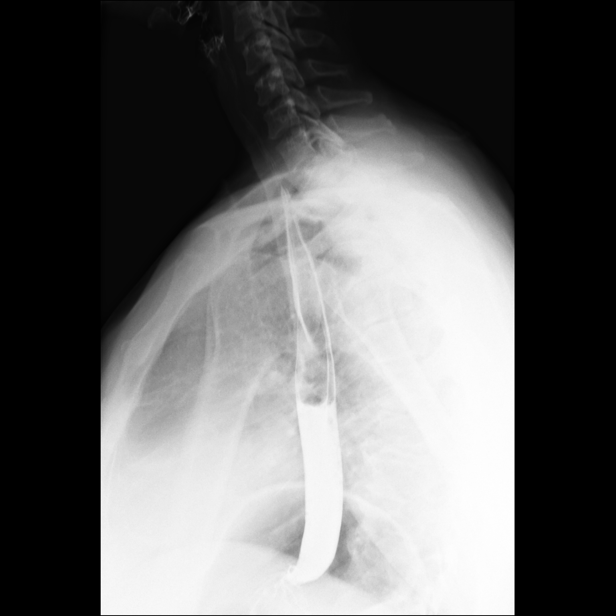

[Series 4: one shot · 1 of 1 slices shown (1 of 2)]
[im 1/1]
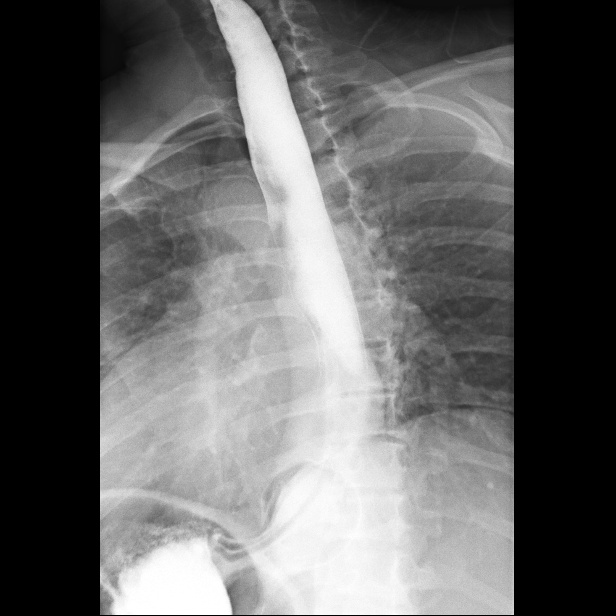

[Series 5: sequence · 3 of 85 frames shown (4 of 4)]
[frame 13/85]
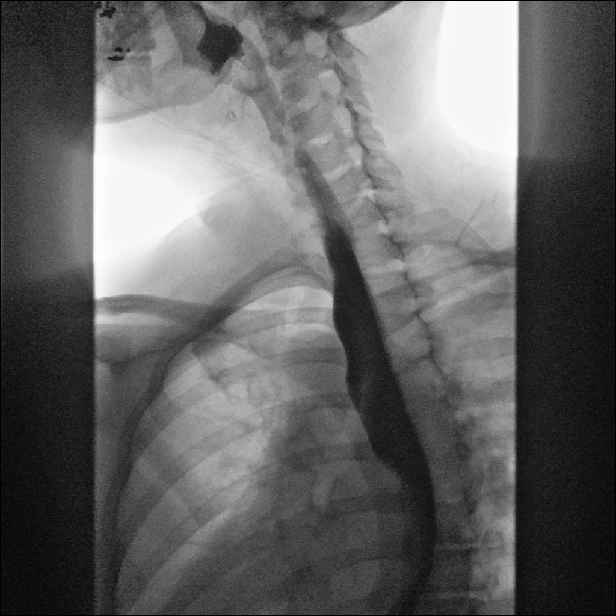
[frame 43/85]
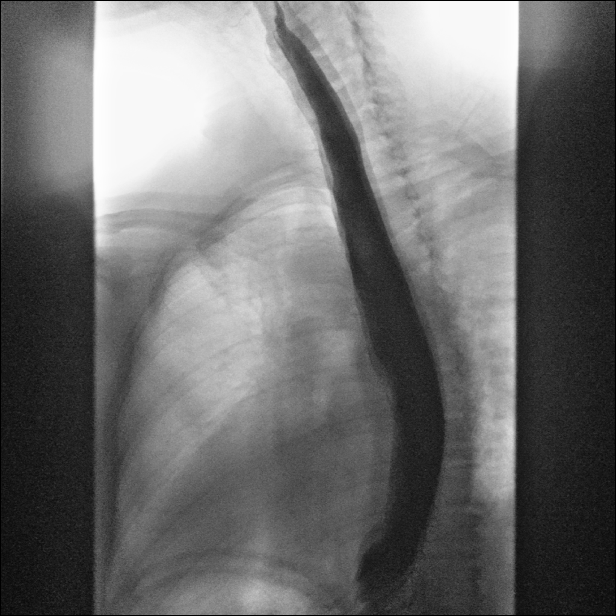
[frame 73/85]
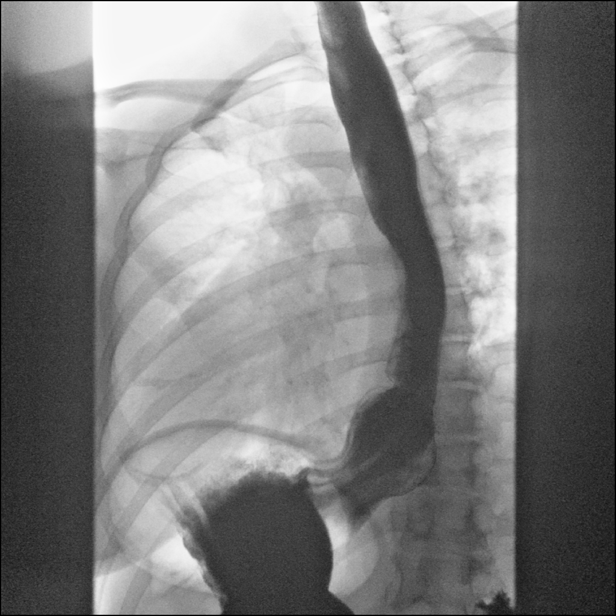

[Series 6: one shot · 2 of 3 slices shown (2 of 2)]
[im 2/3]
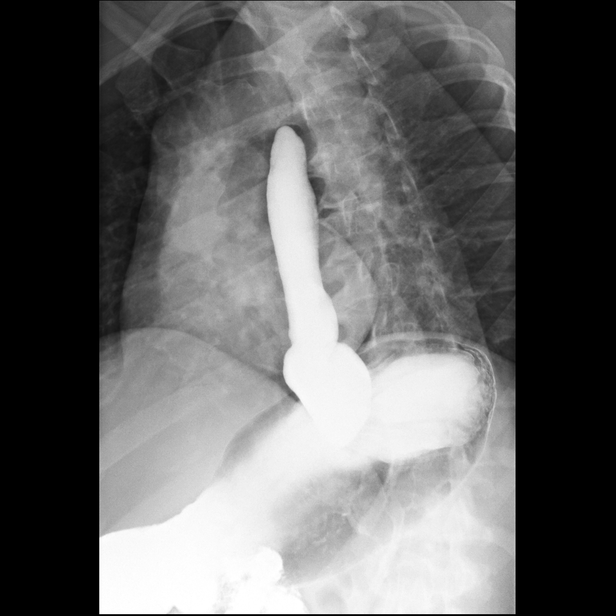
[im 3/3]
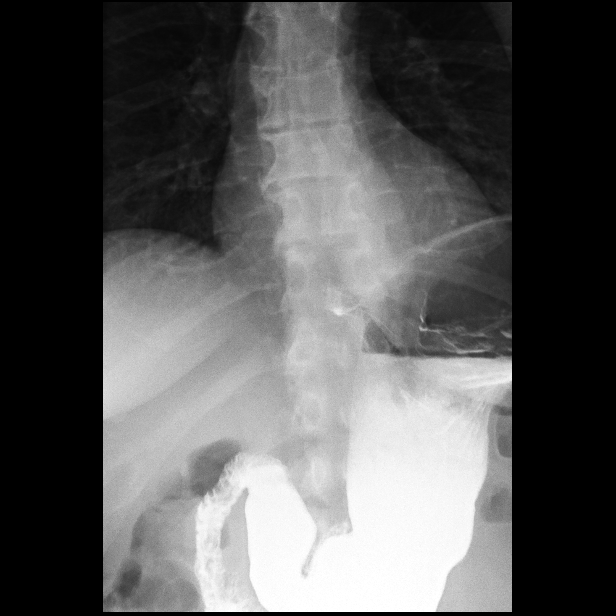

[14 of 18 positions shown; findings below may reference images not displayed]

FINDINGS: Initially a double-contrast barium swallow was performed. The mucosa
of the esophagus is unremarkable. Rapid sequence spot films of the
cervical esophagus show the swallowing mechanism to be normal.
Esophageal peristalsis is normal. There is a small to moderate size
hiatal hernia present. Significant gastroesophageal reflux is
demonstrated. A barium pill was given at the end of the study which
did pass into the stomach without delay.
IMPRESSION: 1. Small to moderate size hiatal hernia with significant
gastroesophageal reflux.
2. Barium pill passes into the stomach without delay.

## 2018-06-21 DIAGNOSIS — H903 Sensorineural hearing loss, bilateral: Secondary | ICD-10-CM | POA: Diagnosis not present

## 2018-07-13 DIAGNOSIS — Z6833 Body mass index (BMI) 33.0-33.9, adult: Secondary | ICD-10-CM | POA: Diagnosis not present

## 2018-07-13 DIAGNOSIS — M5442 Lumbago with sciatica, left side: Secondary | ICD-10-CM | POA: Diagnosis not present

## 2018-07-13 DIAGNOSIS — G8929 Other chronic pain: Secondary | ICD-10-CM | POA: Diagnosis not present

## 2018-07-17 DIAGNOSIS — J0100 Acute maxillary sinusitis, unspecified: Secondary | ICD-10-CM | POA: Diagnosis not present

## 2018-07-20 DIAGNOSIS — M5442 Lumbago with sciatica, left side: Secondary | ICD-10-CM | POA: Diagnosis not present

## 2018-07-20 DIAGNOSIS — M545 Low back pain: Secondary | ICD-10-CM | POA: Diagnosis not present

## 2018-07-27 ENCOUNTER — Other Ambulatory Visit: Payer: Self-pay | Admitting: Neurosurgery

## 2018-07-27 DIAGNOSIS — M5442 Lumbago with sciatica, left side: Secondary | ICD-10-CM | POA: Diagnosis not present

## 2018-07-27 DIAGNOSIS — R030 Elevated blood-pressure reading, without diagnosis of hypertension: Secondary | ICD-10-CM | POA: Diagnosis not present

## 2018-07-27 DIAGNOSIS — M4310 Spondylolisthesis, site unspecified: Secondary | ICD-10-CM | POA: Diagnosis not present

## 2018-08-08 DIAGNOSIS — J0100 Acute maxillary sinusitis, unspecified: Secondary | ICD-10-CM | POA: Diagnosis not present

## 2018-08-23 NOTE — Pre-Procedure Instructions (Signed)
Martha Washington  08/23/2018      [facility] - [place], [place] - [address] [place] [address] Phone: [phone] Fax: [phone]    Your procedure is scheduled on Friday January 24th.  Report to [facility] [employer] Admitting at 0800 A.M.  Call this number if you have problems the morning of surgery:  [phone]   Remember:  Do not eat or drink after midnight.    Take these medicines the morning of surgery with A SIP OF WATER  None  7 days prior to surgery STOP taking any Aspirin (unless otherwise instructed by your surgeon), Aleve, Naproxen, Ibuprofen, Motrin, Advil, Goody's, BC's, all herbal medications, fish oil, and all vitamins.    Do not wear jewelry, make-up or nail polish.  Do not wear lotions, powders, or perfumes, or deodorant.  Do not shave 48 hours prior to surgery.  Men may shave face and neck.  Do not bring valuables to the hospital.  [facility] is not responsible for any belongings or valuables.  Contacts, dentures or bridgework may not be worn into surgery.  Leave your suitcase in the car.  After surgery it may be brought to your room.  For patients admitted to the hospital, discharge time will be determined by your treatment team.  Patients discharged the day of surgery will not be allowed to drive home.    [facility]- Preparing For Surgery  Before surgery, you can play an important role. Because skin is not sterile, your skin needs to be as free of germs as possible. You can reduce the number of germs on your skin by washing with CHG (chlorahexidine gluconate) Soap before surgery.  CHG is an antiseptic cleaner which kills germs and bonds with the skin to continue killing germs even after washing.    Oral Hygiene is also important to reduce your risk of infection.  Remember - BRUSH YOUR TEETH THE MORNING OF SURGERY WITH YOUR REGULAR TOOTHPASTE  Please do not use if you have an allergy to CHG or antibacterial  soaps. If your skin becomes reddened/irritated stop using the CHG.  Do not shave (including legs and underarms) for at least 48 hours prior to first CHG shower. It is OK to shave your face.  Please follow these instructions carefully.   1. Shower the NIGHT BEFORE SURGERY and the MORNING OF SURGERY with CHG.   2. If you chose to wash your hair, wash your hair first as usual with your normal shampoo.  3. After you shampoo, rinse your hair and body thoroughly to remove the shampoo.  4. Use CHG as you would any other liquid soap. You can apply CHG directly to the skin and wash gently with a scrungie or a clean washcloth.   5. Apply the CHG Soap to your body ONLY FROM THE NECK DOWN.  Do not use on open wounds or open sores. Avoid contact with your eyes, ears, mouth and genitals (private parts). Wash Face and genitals (private parts)  with your normal soap.  6. Wash thoroughly, paying special attention to the area where your surgery will be performed.  7. Thoroughly rinse your body with warm water from the neck down.  8. DO NOT shower/wash with your normal soap after using and rinsing off the CHG Soap.  9. Pat yourself dry with a CLEAN TOWEL.  10. Wear CLEAN PAJAMAS to bed the night before surgery, wear comfortable clothes the morning of surgery  11. Place CLEAN SHEETS on your bed the night of your first shower and DO NOT SLEEP WITH PETS.    Day of Surgery:  Do not apply any deodorants/lotions.  Please wear clean clothes to the hospital/surgery center.   Remember to brush your teeth WITH YOUR REGULAR TOOTHPASTE.    Please read over the following fact sheets that you were given.

## 2018-08-24 ENCOUNTER — Encounter (HOSPITAL_COMMUNITY)
Admission: RE | Admit: 2018-08-24 | Discharge: 2018-08-24 | Disposition: A | Discharge status: Home / Self Care | Payer: 59 | Source: Ambulatory Visit | Attending: Neurosurgery | Admitting: Neurosurgery

## 2018-08-24 ENCOUNTER — Encounter (HOSPITAL_COMMUNITY): Payer: Self-pay

## 2018-08-24 ENCOUNTER — Other Ambulatory Visit: Payer: Self-pay

## 2018-08-24 DIAGNOSIS — Z01818 Encounter for other preprocedural examination: Secondary | ICD-10-CM | POA: Diagnosis not present

## 2018-08-24 LAB — BASIC METABOLIC PANEL
Anion gap: 11 (ref 5–15)
BUN: 13 mg/dL (ref 6–20)
CO2: 20 mmol/L — ABNORMAL LOW (ref 22–32)
Calcium: 9.6 mg/dL (ref 8.9–10.3)
Chloride: 110 mmol/L (ref 98–111)
Creatinine, Ser: 0.71 mg/dL (ref 0.44–1.00)
GFR calc Af Amer: >60 mL/min (ref >60)
GFR calc non Af Amer: >60 mL/min (ref >60)
Glucose, Bld: 73 mg/dL (ref 70–99)
Potassium: 4.1 mmol/L (ref 3.5–5.1)
Sodium: 141 mmol/L (ref 135–145)

## 2018-08-24 LAB — TYPE AND SCREEN
ABO/RH(D): A NEG
Antibody Screen: NEGATIVE

## 2018-08-24 LAB — CBC WITH DIFFERENTIAL/PLATELET
Abs Immature Granulocytes: 0.01 10*3/uL (ref 0.00–0.07)
Basophils Absolute: 0 10*3/uL (ref 0.0–0.1)
Basophils Relative: 0 %
Eosinophils Absolute: 0.1 10*3/uL (ref 0.0–0.5)
Eosinophils Relative: 1 %
HCT: 44.4 % (ref 36.0–46.0)
Hemoglobin: 14.1 g/dL (ref 12.0–15.0)
Immature Granulocytes: 0 %
Lymphocytes Relative: 32 %
Lymphs Abs: 1.5 10*3/uL (ref 0.7–4.0)
MCH: 28.4 pg (ref 26.0–34.0)
MCHC: 31.8 g/dL (ref 30.0–36.0)
MCV: 89.3 fL (ref 80.0–100.0)
Monocytes Absolute: 0.6 10*3/uL (ref 0.1–1.0)
Monocytes Relative: 12 %
Neutro Abs: 2.6 10*3/uL (ref 1.7–7.7)
Neutrophils Relative %: 55 %
Platelets: 260 10*3/uL (ref 150–400)
RBC: 4.97 MIL/uL (ref 3.87–5.11)
RDW: 13.5 % (ref 11.5–15.5)
WBC: 4.8 10*3/uL (ref 4.0–10.5)
nRBC: 0 % (ref 0.0–0.2)

## 2018-08-24 LAB — ABO/RH: ABO/RH(D): A NEG

## 2018-08-24 LAB — SURGICAL PCR SCREEN
MRSA, PCR: NEGATIVE
Staphylococcus aureus: NEGATIVE

## 2018-08-24 NOTE — Progress Notes (Signed)
PCP/GYN - Dr. Alfredo BattyMattern- [facility]  Cardiologist - Denies  Chest x-ray - Denies  EKG - 08/24/2018  Stress Test - Denies  ECHO - 08/22/2018 (E)  Cardiac Cath - Denies  AICD- na PM- na LOOP- na  Sleep Study - Denies CPAP - None  LABS- 08/24/2018: CBC w/D, BMP, T/S, PCR  ASA- Denies   Anesthesia- No  Pt denies having chest pain, sob, or fever at this time. All instructions explained to the pt, with a verbal understanding of the material. Pt agrees to go over the instructions while at home for a better understanding. The opportunity to ask questions was provided.

## 2018-09-01 ENCOUNTER — Encounter (HOSPITAL_COMMUNITY)
Admission: RE | Disposition: A | Discharge status: Home / Self Care | Payer: Self-pay | Source: Home / Self Care | Attending: Neurosurgery

## 2018-09-01 ENCOUNTER — Other Ambulatory Visit: Payer: Self-pay

## 2018-09-01 ENCOUNTER — Encounter (HOSPITAL_COMMUNITY): Payer: Self-pay | Admitting: *Deleted

## 2018-09-01 ENCOUNTER — Inpatient Hospital Stay (HOSPITAL_COMMUNITY)
Admission: RE | Admit: 2018-09-01 | Discharge: 2018-09-02 | DRG: 455 | Disposition: A | Discharge status: Home / Self Care | Payer: 59 | Attending: Neurosurgery | Admitting: Neurosurgery

## 2018-09-01 ENCOUNTER — Inpatient Hospital Stay (HOSPITAL_COMMUNITY): Payer: 59 | Admitting: Anesthesiology

## 2018-09-01 ENCOUNTER — Inpatient Hospital Stay (HOSPITAL_COMMUNITY): Payer: 59

## 2018-09-01 DIAGNOSIS — Z90722 Acquired absence of ovaries, bilateral: Secondary | ICD-10-CM | POA: Diagnosis not present

## 2018-09-01 DIAGNOSIS — M4326 Fusion of spine, lumbar region: Secondary | ICD-10-CM | POA: Diagnosis not present

## 2018-09-01 DIAGNOSIS — Z79899 Other long term (current) drug therapy: Secondary | ICD-10-CM

## 2018-09-01 DIAGNOSIS — Z90710 Acquired absence of both cervix and uterus: Secondary | ICD-10-CM

## 2018-09-01 DIAGNOSIS — M4316 Spondylolisthesis, lumbar region: Secondary | ICD-10-CM | POA: Diagnosis not present

## 2018-09-01 DIAGNOSIS — E7800 Pure hypercholesterolemia, unspecified: Secondary | ICD-10-CM | POA: Diagnosis present

## 2018-09-01 DIAGNOSIS — Z885 Allergy status to narcotic agent status: Secondary | ICD-10-CM | POA: Diagnosis not present

## 2018-09-01 DIAGNOSIS — M5416 Radiculopathy, lumbar region: Secondary | ICD-10-CM | POA: Diagnosis present

## 2018-09-01 DIAGNOSIS — K219 Gastro-esophageal reflux disease without esophagitis: Secondary | ICD-10-CM | POA: Diagnosis present

## 2018-09-01 DIAGNOSIS — I4891 Unspecified atrial fibrillation: Secondary | ICD-10-CM | POA: Diagnosis present

## 2018-09-01 DIAGNOSIS — Z881 Allergy status to other antibiotic agents status: Secondary | ICD-10-CM

## 2018-09-01 DIAGNOSIS — M48061 Spinal stenosis, lumbar region without neurogenic claudication: Secondary | ICD-10-CM | POA: Diagnosis present

## 2018-09-01 DIAGNOSIS — M4310 Spondylolisthesis, site unspecified: Secondary | ICD-10-CM | POA: Diagnosis present

## 2018-09-01 DIAGNOSIS — Z419 Encounter for procedure for purposes other than remedying health state, unspecified: Secondary | ICD-10-CM

## 2018-09-01 DIAGNOSIS — Z8249 Family history of ischemic heart disease and other diseases of the circulatory system: Secondary | ICD-10-CM | POA: Diagnosis not present

## 2018-09-01 SURGERY — POSTERIOR LUMBAR FUSION 1 LEVEL
Anesthesia: General | Site: Back

## 2018-09-01 MED ORDER — CEFAZOLIN SODIUM-DEXTROSE 1-4 GM/50ML-% IV SOLN
1.0000 g | Freq: Three times a day (TID) | INTRAVENOUS | Status: AC
  Administered 2018-09-01 – 2018-09-02 (×2): 1 g via INTRAVENOUS
  Filled 2018-09-01 (×2): qty 50

## 2018-09-01 MED ORDER — DIAZEPAM 5 MG PO TABS
5.0000 mg | ORAL_TABLET | Freq: Four times a day (QID) | ORAL | Status: DC | PRN
  Administered 2018-09-01: 5 mg via ORAL
  Administered 2018-09-02 (×2): 10 mg via ORAL
  Filled 2018-09-01 (×2): qty 2

## 2018-09-01 MED ORDER — VANCOMYCIN HCL IN DEXTROSE 1-5 GM/200ML-% IV SOLN
INTRAVENOUS | Status: AC
  Administered 2018-09-01: 1000 mg via INTRAVENOUS
  Filled 2018-09-01: qty 200

## 2018-09-01 MED ORDER — ACETAMINOPHEN 650 MG RE SUPP: 650.0000 mg | RECTAL | Status: DC | PRN

## 2018-09-01 MED ORDER — CHLORHEXIDINE GLUCONATE CLOTH 2 % EX PADS: 6.0000 | MEDICATED_PAD | Freq: Once | CUTANEOUS | Status: DC

## 2018-09-01 MED ORDER — PHENOL 1.4 % MT LIQD: 1.0000 | OROMUCOSAL | Status: DC | PRN

## 2018-09-01 MED ORDER — DIPHENHYDRAMINE HCL 25 MG PO TABS
25.0000 mg | ORAL_TABLET | Freq: Every evening | ORAL | Status: DC | PRN
  Filled 2018-09-01: qty 1

## 2018-09-01 MED ORDER — POLYETHYLENE GLYCOL 3350 17 G PO PACK: 17.0000 g | PACK | Freq: Every day | ORAL | Status: DC | PRN

## 2018-09-01 MED ORDER — HYDROCODONE-ACETAMINOPHEN 10-325 MG PO TABS
1.0000 | ORAL_TABLET | ORAL | Status: DC | PRN
  Administered 2018-09-01 – 2018-09-02 (×4): 1 via ORAL
  Filled 2018-09-01 (×3): qty 1

## 2018-09-01 MED ORDER — ACETAMINOPHEN 325 MG PO TABS: 325.0000 mg | ORAL_TABLET | ORAL | Status: DC | PRN

## 2018-09-01 MED ORDER — FLEET ENEMA 7-19 GM/118ML RE ENEM: 1.0000 | ENEMA | Freq: Once | RECTAL | Status: DC | PRN

## 2018-09-01 MED ORDER — ONDANSETRON HCL 4 MG/2ML IJ SOLN
INTRAMUSCULAR | Status: DC | PRN
  Administered 2018-09-01: 4 mg via INTRAVENOUS

## 2018-09-01 MED ORDER — FENTANYL CITRATE (PF) 250 MCG/5ML IJ SOLN
INTRAMUSCULAR | Status: AC
  Filled 2018-09-01: qty 5

## 2018-09-01 MED ORDER — SUCCINYLCHOLINE CHLORIDE 200 MG/10ML IV SOSY
PREFILLED_SYRINGE | INTRAVENOUS | Status: AC
  Filled 2018-09-01: qty 10

## 2018-09-01 MED ORDER — SODIUM CHLORIDE 0.9% FLUSH: 3.0000 mL | Freq: Two times a day (BID) | INTRAVENOUS | Status: DC

## 2018-09-01 MED ORDER — SODIUM CHLORIDE 0.9 % IV SOLN: 250.0000 mL | INTRAVENOUS | Status: DC

## 2018-09-01 MED ORDER — ACETAMINOPHEN 160 MG/5ML PO SOLN: 325.0000 mg | ORAL | Status: DC | PRN

## 2018-09-01 MED ORDER — MIDAZOLAM HCL 5 MG/5ML IJ SOLN
INTRAMUSCULAR | Status: DC | PRN
  Administered 2018-09-01: 2 mg via INTRAVENOUS

## 2018-09-01 MED ORDER — DEXAMETHASONE SODIUM PHOSPHATE 10 MG/ML IJ SOLN
INTRAMUSCULAR | Status: AC
  Filled 2018-09-01: qty 1

## 2018-09-01 MED ORDER — VANCOMYCIN HCL IN DEXTROSE 1-5 GM/200ML-% IV SOLN
1000.0000 mg | INTRAVENOUS | Status: AC
  Administered 2018-09-01: 1000 mg via INTRAVENOUS

## 2018-09-01 MED ORDER — OXYCODONE HCL 5 MG PO TABS: 5.0000 mg | ORAL_TABLET | Freq: Once | ORAL | Status: DC | PRN

## 2018-09-01 MED ORDER — OXYCODONE HCL 5 MG PO TABS: 10.0000 mg | ORAL_TABLET | ORAL | Status: DC | PRN

## 2018-09-01 MED ORDER — MIDAZOLAM HCL 2 MG/2ML IJ SOLN
INTRAMUSCULAR | Status: AC
  Filled 2018-09-01: qty 2

## 2018-09-01 MED ORDER — ONDANSETRON HCL 4 MG PO TABS: 4.0000 mg | ORAL_TABLET | Freq: Four times a day (QID) | ORAL | Status: DC | PRN

## 2018-09-01 MED ORDER — SODIUM CHLORIDE 0.9% FLUSH: 3.0000 mL | INTRAVENOUS | Status: DC | PRN

## 2018-09-01 MED ORDER — DIAZEPAM 5 MG PO TABS
ORAL_TABLET | ORAL | Status: AC
  Filled 2018-09-01: qty 1

## 2018-09-01 MED ORDER — BUPIVACAINE HCL (PF) 0.25 % IJ SOLN
INTRAMUSCULAR | Status: DC | PRN
  Administered 2018-09-01: 30 mL

## 2018-09-01 MED ORDER — ONDANSETRON HCL 4 MG/2ML IJ SOLN
INTRAMUSCULAR | Status: AC
  Filled 2018-09-01: qty 4

## 2018-09-01 MED ORDER — ONDANSETRON HCL 4 MG/2ML IJ SOLN: 4.0000 mg | Freq: Once | INTRAMUSCULAR | Status: DC | PRN

## 2018-09-01 MED ORDER — VANCOMYCIN HCL 1 G IV SOLR
INTRAVENOUS | Status: DC | PRN
  Administered 2018-09-01: 1000 mg

## 2018-09-01 MED ORDER — FENTANYL CITRATE (PF) 100 MCG/2ML IJ SOLN: 25.0000 ug | INTRAMUSCULAR | Status: DC | PRN

## 2018-09-01 MED ORDER — HYDROMORPHONE HCL 1 MG/ML IJ SOLN
1.0000 mg | INTRAMUSCULAR | Status: DC | PRN
  Administered 2018-09-01: 1 mg via INTRAVENOUS

## 2018-09-01 MED ORDER — 0.9 % SODIUM CHLORIDE (POUR BTL) OPTIME
TOPICAL | Status: DC | PRN
  Administered 2018-09-01: 1000 mL

## 2018-09-01 MED ORDER — FENTANYL CITRATE (PF) 100 MCG/2ML IJ SOLN
INTRAMUSCULAR | Status: DC | PRN
  Administered 2018-09-01 (×3): 50 ug via INTRAVENOUS

## 2018-09-01 MED ORDER — OXYCODONE HCL 5 MG/5ML PO SOLN: 5.0000 mg | Freq: Once | ORAL | Status: DC | PRN

## 2018-09-01 MED ORDER — EPHEDRINE 5 MG/ML INJ
INTRAVENOUS | Status: AC
  Filled 2018-09-01: qty 10

## 2018-09-01 MED ORDER — THROMBIN 20000 UNITS EX SOLR
CUTANEOUS | Status: DC | PRN
  Administered 2018-09-01: 20 mL via TOPICAL

## 2018-09-01 MED ORDER — ONDANSETRON HCL 4 MG/2ML IJ SOLN
INTRAMUSCULAR | Status: AC
  Filled 2018-09-01: qty 2

## 2018-09-01 MED ORDER — LIDOCAINE 2% (20 MG/ML) 5 ML SYRINGE
INTRAMUSCULAR | Status: AC
  Filled 2018-09-01: qty 15

## 2018-09-01 MED ORDER — ONDANSETRON HCL 4 MG/2ML IJ SOLN: 4.0000 mg | Freq: Four times a day (QID) | INTRAMUSCULAR | Status: DC | PRN

## 2018-09-01 MED ORDER — MENTHOL 3 MG MT LOZG: 1.0000 | LOZENGE | OROMUCOSAL | Status: DC | PRN

## 2018-09-01 MED ORDER — THROMBIN 20000 UNITS EX SOLR
CUTANEOUS | Status: AC
  Filled 2018-09-01: qty 20000

## 2018-09-01 MED ORDER — LIDOCAINE 2% (20 MG/ML) 5 ML SYRINGE
INTRAMUSCULAR | Status: DC | PRN
  Administered 2018-09-01: 40 mg via INTRAVENOUS

## 2018-09-01 MED ORDER — SUGAMMADEX SODIUM 200 MG/2ML IV SOLN
INTRAVENOUS | Status: DC | PRN
  Administered 2018-09-01: 172.4 mg via INTRAVENOUS

## 2018-09-01 MED ORDER — ROCURONIUM BROMIDE 50 MG/5ML IV SOSY
PREFILLED_SYRINGE | INTRAVENOUS | Status: AC
  Filled 2018-09-01: qty 25

## 2018-09-01 MED ORDER — LACTATED RINGERS IV SOLN
INTRAVENOUS | Status: DC
  Administered 2018-09-01 (×2): via INTRAVENOUS

## 2018-09-01 MED ORDER — HYDROMORPHONE HCL 1 MG/ML IJ SOLN
INTRAMUSCULAR | Status: AC
  Filled 2018-09-01: qty 1

## 2018-09-01 MED ORDER — VANCOMYCIN HCL 1000 MG IV SOLR
INTRAVENOUS | Status: AC
  Filled 2018-09-01: qty 1000

## 2018-09-01 MED ORDER — ACETAMINOPHEN 325 MG PO TABS: 650.0000 mg | ORAL_TABLET | ORAL | Status: DC | PRN

## 2018-09-01 MED ORDER — ROCURONIUM BROMIDE 100 MG/10ML IV SOLN
INTRAVENOUS | Status: DC | PRN
  Administered 2018-09-01: 50 mg via INTRAVENOUS
  Administered 2018-09-01 (×2): 20 mg via INTRAVENOUS
  Administered 2018-09-01: 10 mg via INTRAVENOUS
  Administered 2018-09-01: 20 mg via INTRAVENOUS

## 2018-09-01 MED ORDER — HYDROCODONE-ACETAMINOPHEN 10-325 MG PO TABS
ORAL_TABLET | ORAL | Status: AC
  Filled 2018-09-01: qty 1

## 2018-09-01 MED ORDER — PHENYLEPHRINE 40 MCG/ML (10ML) SYRINGE FOR IV PUSH (FOR BLOOD PRESSURE SUPPORT)
PREFILLED_SYRINGE | INTRAVENOUS | Status: AC
  Filled 2018-09-01: qty 20

## 2018-09-01 MED ORDER — SODIUM CHLORIDE 0.9 % IV SOLN
INTRAVENOUS | Status: DC | PRN
  Administered 2018-09-01: 40 ug/min via INTRAVENOUS

## 2018-09-01 MED ORDER — BISACODYL 10 MG RE SUPP: 10.0000 mg | Freq: Every day | RECTAL | Status: DC | PRN

## 2018-09-01 MED ORDER — BUPIVACAINE HCL (PF) 0.25 % IJ SOLN
INTRAMUSCULAR | Status: AC
  Filled 2018-09-01: qty 30

## 2018-09-01 MED ORDER — MEPERIDINE HCL 50 MG/ML IJ SOLN: 6.2500 mg | INTRAMUSCULAR | Status: DC | PRN

## 2018-09-01 MED ORDER — THROMBIN 5000 UNITS EX SOLR
CUTANEOUS | Status: AC
  Filled 2018-09-01: qty 5000

## 2018-09-01 MED ORDER — PROPOFOL 10 MG/ML IV BOLUS
INTRAVENOUS | Status: DC | PRN
  Administered 2018-09-01: 160 mg via INTRAVENOUS

## 2018-09-01 MED ORDER — TRAMADOL HCL 50 MG PO TABS
50.0000 mg | ORAL_TABLET | Freq: Four times a day (QID) | ORAL | Status: DC | PRN
  Administered 2018-09-02: 100 mg via ORAL
  Filled 2018-09-01: qty 2

## 2018-09-01 MED ORDER — SODIUM CHLORIDE 0.9 % IV SOLN
INTRAVENOUS | Status: DC | PRN
  Administered 2018-09-01: 500 mL

## 2018-09-01 MED ORDER — DEXAMETHASONE SODIUM PHOSPHATE 10 MG/ML IJ SOLN
10.0000 mg | INTRAMUSCULAR | Status: AC
  Administered 2018-09-01: 10 mg via INTRAVENOUS

## 2018-09-01 MED ORDER — ROCURONIUM BROMIDE 50 MG/5ML IV SOSY
PREFILLED_SYRINGE | INTRAVENOUS | Status: AC
  Filled 2018-09-01: qty 5

## 2018-09-01 SURGICAL SUPPLY — 64 items
BAG DECANTER FOR FLEXI CONT (MISCELLANEOUS) ×2 IMPLANT
BENZOIN TINCTURE PRP APPL 2/3 (GAUZE/BANDAGES/DRESSINGS) ×2 IMPLANT
BLADE CLIPPER SURG (BLADE) IMPLANT
BUR CUTTER 7.0 ROUND (BURR) IMPLANT
BUR MATCHSTICK NEURO 3.0 LAGG (BURR) ×2 IMPLANT
CANISTER SUCT 3000ML PPV (MISCELLANEOUS) ×2 IMPLANT
CAP LCK SPNE (Orthopedic Implant) ×4 IMPLANT
CAP LOCK SPINE RADIUS (Orthopedic Implant) ×4 IMPLANT
CAP LOCKING (Orthopedic Implant) ×4 IMPLANT
CARTRIDGE OIL MAESTRO DRILL (MISCELLANEOUS) ×1 IMPLANT
CLSR STERI-STRIP ANTIMIC 1/2X4 (GAUZE/BANDAGES/DRESSINGS) ×2 IMPLANT
CONT SPEC 4OZ CLIKSEAL STRL BL (MISCELLANEOUS) ×2 IMPLANT
COVER BACK TABLE 60X90IN (DRAPES) ×2 IMPLANT
COVER WAND RF STERILE (DRAPES) IMPLANT
DECANTER SPIKE VIAL GLASS SM (MISCELLANEOUS) ×2 IMPLANT
DERMABOND ADVANCED (GAUZE/BANDAGES/DRESSINGS) ×1
DERMABOND ADVANCED .7 DNX12 (GAUZE/BANDAGES/DRESSINGS) ×1 IMPLANT
DEVICE INTERBODY ELEVATE 23X9 (Cage) ×4 IMPLANT
DIFFUSER DRILL AIR PNEUMATIC (MISCELLANEOUS) ×2 IMPLANT
DRAPE C-ARM 42X72 X-RAY (DRAPES) ×4 IMPLANT
DRAPE HALF SHEET 40X57 (DRAPES) ×2 IMPLANT
DRAPE LAPAROTOMY 100X72X124 (DRAPES) ×2 IMPLANT
DRAPE SURG 17X23 STRL (DRAPES) ×8 IMPLANT
DRSG OPSITE POSTOP 4X6 (GAUZE/BANDAGES/DRESSINGS) ×2 IMPLANT
DURAPREP 26ML APPLICATOR (WOUND CARE) ×2 IMPLANT
ELECT REM PT RETURN 9FT ADLT (ELECTROSURGICAL) ×2
ELECTRODE REM PT RTRN 9FT ADLT (ELECTROSURGICAL) ×1 IMPLANT
EVACUATOR 1/8 PVC DRAIN (DRAIN) IMPLANT
GAUZE 4X4 16PLY RFD (DISPOSABLE) IMPLANT
GAUZE SPONGE 4X4 12PLY STRL (GAUZE/BANDAGES/DRESSINGS) IMPLANT
GLOVE BIOGEL PI IND STRL 6.5 (GLOVE) ×1 IMPLANT
GLOVE BIOGEL PI IND STRL 7.0 (GLOVE) ×2 IMPLANT
GLOVE BIOGEL PI IND STRL 7.5 (GLOVE) ×5 IMPLANT
GLOVE BIOGEL PI INDICATOR 6.5 (GLOVE) ×1
GLOVE BIOGEL PI INDICATOR 7.0 (GLOVE) ×2
GLOVE BIOGEL PI INDICATOR 7.5 (GLOVE) ×5
GLOVE ECLIPSE 9.0 STRL (GLOVE) ×4 IMPLANT
GLOVE EXAM NITRILE XL STR (GLOVE) IMPLANT
GLOVE SURG SS PI 7.0 STRL IVOR (GLOVE) ×6 IMPLANT
GOWN STRL REUS W/ TWL LRG LVL3 (GOWN DISPOSABLE) ×1 IMPLANT
GOWN STRL REUS W/ TWL XL LVL3 (GOWN DISPOSABLE) ×3 IMPLANT
GOWN STRL REUS W/TWL 2XL LVL3 (GOWN DISPOSABLE) IMPLANT
GOWN STRL REUS W/TWL LRG LVL3 (GOWN DISPOSABLE) ×1
GOWN STRL REUS W/TWL XL LVL3 (GOWN DISPOSABLE) ×3
KIT BASIN OR (CUSTOM PROCEDURE TRAY) ×2 IMPLANT
KIT TURNOVER KIT B (KITS) ×2 IMPLANT
MILL MEDIUM DISP (BLADE) ×2 IMPLANT
NEEDLE HYPO 22GX1.5 SAFETY (NEEDLE) ×2 IMPLANT
NS IRRIG 1000ML POUR BTL (IV SOLUTION) ×2 IMPLANT
OIL CARTRIDGE MAESTRO DRILL (MISCELLANEOUS) ×2
PACK LAMINECTOMY NEURO (CUSTOM PROCEDURE TRAY) ×2 IMPLANT
ROD RADIUS 35MM (Rod) ×4 IMPLANT
SCREW 5.75X40M (Screw) ×4 IMPLANT
SCREW 5.75X45MM (Screw) ×4 IMPLANT
SPONGE SURGIFOAM ABS GEL 100 (HEMOSTASIS) ×2 IMPLANT
STRIP CLOSURE SKIN 1/2X4 (GAUZE/BANDAGES/DRESSINGS) ×4 IMPLANT
SUT VIC AB 0 CT1 18XCR BRD8 (SUTURE) ×1 IMPLANT
SUT VIC AB 0 CT1 8-18 (SUTURE) ×1
SUT VIC AB 2-0 CT1 18 (SUTURE) ×2 IMPLANT
SUT VIC AB 3-0 SH 8-18 (SUTURE) ×2 IMPLANT
TOWEL GREEN STERILE (TOWEL DISPOSABLE) ×2 IMPLANT
TOWEL GREEN STERILE FF (TOWEL DISPOSABLE) ×2 IMPLANT
TRAY FOLEY MTR SLVR 16FR STAT (SET/KITS/TRAYS/PACK) ×2 IMPLANT
WATER STERILE IRR 1000ML POUR (IV SOLUTION) ×2 IMPLANT

## 2018-09-01 NOTE — Anesthesia Procedure Notes (Signed)
Procedure Name: Intubation Date/Time: 09/01/2018 11:58 AM Performed by: Bethena Midgetddono, Ernest, MD Pre-anesthesia Checklist: Patient identified, Emergency Drugs available, Suction available and Patient being monitored Patient Re-evaluated:Patient Re-evaluated prior to induction Oxygen Delivery Method: Circle system utilized Preoxygenation: Pre-oxygenation with 100% oxygen Induction Type: IV induction Ventilation: Mask ventilation without difficulty Laryngoscope Size: Miller and 2 Grade View: Grade I Tube type: Oral Tube size: 7.0 mm Number of attempts: 1 Airway Equipment and Method: Stylet Secured at: 23 cm Tube secured with: Tape Dental Injury: Teeth and Oropharynx as per pre-operative assessment

## 2018-09-01 NOTE — Transfer of Care (Signed)
Immediate Anesthesia Transfer of Care Note  Patient: Martha Washington  Procedure(s) Performed: POSTERIOR LUMBAR INTERBODY FUSION LUMBAR FOUR-LUMBAR FIVE - Posterior Lateral and Interbody fusion (N/A Back)  Patient Location: PACU  Anesthesia Type:General  Level of Consciousness: drowsy and patient cooperative  Airway & Oxygen Therapy: Patient Spontanous Breathing and Patient connected to nasal cannula oxygen  Post-op Assessment: Report given to RN and Post -op Vital signs reviewed and stable  Post vital signs: Reviewed and stable  Last Vitals:  Vitals Value Taken Time  BP 90/57 09/01/2018  2:49 PM  Temp    Pulse 69 09/01/2018  2:52 PM  Resp 10 09/01/2018  2:52 PM  SpO2 100 % 09/01/2018  2:52 PM  Vitals shown include unvalidated device data.  Last Pain:  Vitals:   09/01/18 0943  TempSrc: Oral  PainSc:       Patients Stated Pain Goal: 4 (09/01/18 0935)  Complications: No apparent anesthesia complications

## 2018-09-01 NOTE — H&P (Signed)
Martha Washington is an 53 y.o. female.   Chief Complaint: Back pain HPI: 53 year old female with progressive back pain and bilateral lower extremity symptoms failing conservative management.  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  Patient presents now for decompression and fusion in hopes of improving her symptoms.  Past Medical History:  Diagnosis Date  . Atrial fibrillation with RVR (HCC)   . GERD (gastroesophageal reflux disease)    no meds  . High cholesterol   . Menorrhagia 07/13/2013  . Missed abortion    resolved - no surgery required  . New onset atrial fibrillation (HCC) 08/04/2017   with RVR to the 160s; S/P hammertoe OR/notes 08/04/2017  . S/P bunionectomy 08/04/2017  . SVD (spontaneous vaginal delivery) X 2  . Tachycardia 08/04/2017    Past Surgical History:  Procedure Laterality Date  . ABDOMINAL HYSTERECTOMY    . BACK SURGERY    . BUNIONECTOMY WITH HAMMERTOE RECONSTRUCTION Right 03/2017  . BUNIONECTOMY WITH HAMMERTOE RECONSTRUCTION Left 08/04/2017  . BUNIONECTOMY WITH HAMMERTOE RECONSTRUCTION Left 08/04/2017   Procedure: Left modified McBride and Lapidus bunion corrections; Left 2-3 Weil and hammertoe corrections;  Surgeon: Toni Arthurs, MD;  Location: [facility];  Service: Orthopedics;  Laterality: Left;  . ROBOTIC ASSISTED TOTAL HYSTERECTOMY WITH BILATERAL SALPINGO OOPHERECTOMY N/A 07/13/2013   Procedure: ROBOTIC ASSISTED TOTAL HYSTERECTOMY WITH BILATERAL SALPINGECTOMY;  Surgeon: Esmeralda Arthur, MD;  Location: [facility];  Service: Gynecology;  Laterality: N/A;  . SHOULDER ARTHROSCOPY W/ ROTATOR CUFF REPAIR Right 2010  . SHOULDER ARTHROSCOPY WITH ROTATOR CUFF REPAIR Right 10/17/2014   Procedure: SHOULDER ARTHROSCOPY WITH ROTATOR CUFF REPAIR;  Surgeon: Mckinley Jewel, MD;  Location:  SURGERY CENTER;  Service: Orthopedics;  Laterality: Right;  . TUBAL LIGATION    . WEIL OSTEOTOMY Left  08/04/2017   Procedure: Left 2nd and 3rd weil osteotomy;  Surgeon: Toni Arthurs, MD;  Location:  SURGERY CENTER;  Service: Orthopedics;  Laterality: Left;  . WISDOM TOOTH EXTRACTION      Family History  Problem Relation Age of Onset  . Hypertension Mother   . Hypertension Father    Social History:  reports that she has never smoked. She has never used smokeless tobacco. She reports that she does not drink alcohol or use drugs.  Allergies:  Allergies  Allergen Reactions  . Azithromycin     Stomach Cramps  . Morphine And Related Nausea And Vomiting  . Oxycodone     "passed out when taking after last surgery"    Medications Prior to Admission  Medication Sig Dispense Refill  . diphenhydrAMINE (BENADRYL) 25 MG tablet Take 25 mg by mouth at bedtime as needed.    Marland Kitchen OVER THE COUNTER MEDICATION Take 1 capsule by mouth daily. Epicor      No results found for this or any previous visit (from the past 48 hour(s)). No results found.  Pertinent items noted in HPI and remainder of comprehensive ROS otherwise negative.  Blood pressure 116/71, pulse 61, temperature (!) 97.2 F (36.2 C), temperature source Oral, resp. rate 18, height 5\' 4"  (1.626 m), weight 86.2 kg, last menstrual period 07/05/2013, SpO2 99 %.  Patient is awake and alert.  She is oriented and appropriate.  Speech is fluent.  Judgment insight are intact.  Cranial nerve function normal bilateral.  Motor and sensory function extremities normal.  Reflexes are normal except her Achilles reflexes are absent.  Gait is antalgic.  Posture is flexed.  Famished head ears eyes nose throat is unremarked.  Chest and abdomen are benign.  Extremities are free from injury deformity.  Assessment/Plan L4-5 grade 1 degenerative spondylolisthesis with stenosis and radiculopathy.  Plan bilateral L4-5 decompressive laminotomies and foraminotomies followed by posterior lumbar interbody fusion utilizing interbody cages, local harvested  autograft, and anterior plate instrumentation.  Risks and benefits of been explained.  Patient wishes to proceed.  Sherilyn CooterHenry A Maymuna Detzel 09/01/2018, 11:11 AM

## 2018-09-01 NOTE — Anesthesia Preprocedure Evaluation (Addendum)
Anesthesia Evaluation  Patient identified by MRN, date of birth, ID band Patient awake    Reviewed: Allergy & Precautions, H&P , NPO status , Patient's Chart, lab work & pertinent test results  Airway Mallampati: I   Neck ROM: full    Dental  (+) Teeth Intact   Pulmonary neg pulmonary ROS,    breath sounds clear to auscultation       Cardiovascular negative cardio ROS   Rhythm:regular Rate:Normal     Neuro/Psych    GI/Hepatic GERD  ,  Endo/Other    Renal/GU      Musculoskeletal   Abdominal   Peds  Hematology   Anesthesia Other Findings   Reproductive/Obstetrics                            Anesthesia Physical  Anesthesia Plan  ASA: II  Anesthesia Plan: General   Post-op Pain Management:  Regional for Post-op pain   Induction: Intravenous  PONV Risk Score and Plan: 3 and Ondansetron, Dexamethasone, Midazolam and Treatment may vary due to age or medical condition  Airway Management Planned: LMA  Additional Equipment:   Intra-op Plan:   Post-operative Plan:   Informed Consent: I have reviewed the patients History and Physical, chart, labs and discussed the procedure including the risks, benefits and alternatives for the proposed anesthesia with the patient or authorized representative who has indicated his/her understanding and acceptance.       Plan Discussed with: CRNA, Anesthesiologist and Surgeon  Anesthesia Plan Comments:         Anesthesia Quick Evaluation

## 2018-09-01 NOTE — Anesthesia Postprocedure Evaluation (Signed)
Anesthesia Post Note  Patient: Martha Washington  Procedure(s) Performed: POSTERIOR LUMBAR INTERBODY FUSION LUMBAR FOUR-LUMBAR FIVE - Posterior Lateral and Interbody fusion (N/A Back)     Patient location during evaluation: PACU Anesthesia Type: General Level of consciousness: awake and alert Pain management: pain level controlled Vital Signs Assessment: post-procedure vital signs reviewed and stable Respiratory status: spontaneous breathing, nonlabored ventilation, respiratory function stable and patient connected to nasal cannula oxygen Cardiovascular status: blood pressure returned to baseline and stable Postop Assessment: no apparent nausea or vomiting Anesthetic complications: no    Last Vitals:  Vitals:   09/01/18 0943 09/01/18 1450  BP: 116/71   Pulse: 61   Resp: 18   Temp: (!) 36.2 C (!) (P) 36.2 C  SpO2: 99%     Last Pain:  Vitals:   09/01/18 1450  TempSrc:   PainSc: (P) 7                  Brendaly Townsel

## 2018-09-01 NOTE — Op Note (Signed)
Date of procedure: 09/01/2018  Date of dictation: Same  Service: Neurosurgery  Preoperative diagnosis: Grade 2 L4-5 unstable degenerative spondylolisthesis with severe radiculopathy  Postoperative diagnosis: Same  Procedure Name: Bilateral L4-L5 decompressive laminotomies and facetectomies and foraminotomies, more than would be required for simple interbody fusion alone.  Bilateral L4-5 Ponte osteotomies for sagittal plane balance restoration  L4-5 posterior lumbar interbody fusion utilizing interbody cages and locally harvested autograft  L4-5 posterior lateral arthrodesis utilizing nonsegmental pedicle screw fixation and local autograft  Surgeon:Abigal Choung A.Marvene Strohm, M.D.  Asst. Surgeon: Doran Durand, NP  Anesthesia: General  Indication: 53 year old female with severe back and bilateral lower extremity pain failing conservative management.  Work-up demonstrates evidence of a grade 2 unstable degenerative spondylolisthesis with marked stenosis.  Patient has failed conservative management.  She presents now for decompression and fusion in hopes of improving her symptoms.  Operative note: After induction of anesthesia, patient position prone onto Wilson frame and appropriately padded.  Lumbar region prepped and draped sterilely.  Incision made overlying L4-5.  Dissection performed bilaterally.  Retractor placed.  Fluoroscopy used.  Levels confirmed.  Decompressive laminotomies and facetectomies were then performed using Leksell rongeurs and Kerrison rongeurs to remove the inferior two thirds of the lamina of L4 bilaterally.  The entire spinous process of L4, the inferior facets of L4 bilaterally, the majority the superior facet of L5 bilaterally.  Ligament flavum elevated and resected.  Foraminotomies completed on the course the exiting L4 and L5 nerve roots.  Ponte osteotomies completed at the L4-5 level allowing for better reduction of her spondylolisthesis and improvement of her sagittal balance.   Bilateral discectomies then performed.  The space then distracted bilaterally.  The space then cleaned of soft tissue.  With a distractor placed in the patient's right side the dissipates then prepared for interbody fusion.  Soft tissue completely cleaned from the interspace.  A 9 mm Medtronic expandable cage packed with locally harvested autograft was then impacted in place and expanded to its full extent.  Distractor removed patient's right side.  The space prepared on the right side.  Morselized autograft packed in the interspace.  A second cage packed with autograft was then impacted in the place and expanded into its full extent.  Pedicles of L4 and L5 were then identified using surface landmarks and intraoperative fluoroscopy.  Superficial bone overlying the pedicle was entered using high-speed drill.  Each pedicle was then probed using a pedicle awl.  Each pedicle all track was probed and found to be solidly within the bone.  Each pedicle tract was then tapped with a screw tapped.  Each screw temple was probed and found to be solidly within the bone.  5.75 mm radius bran screws from [employer] were placed bilaterally at L4 and L5.  Final images reveal good position of the cages and the hardware at the proper operative level with normal alignment of spine.  Transverse processes and residual facets were decorticated.  Morselized autograft was packed posterior laterally.  Short segment titanium rod placed over the screw heads bilaterally.  Locking caps placed over the screws.  Locking caps and engaged with a construct under compression.  Gelfoam placed over the laminectomy defect.  Vancomycin powder placed in the deep wound space.  Wounds and closed in layers.  Steri-Strips and sterile dressing were applied.  No apparent complications.  Patient tolerated the procedure well and she returns to the recovery room postop.

## 2018-09-01 NOTE — Brief Op Note (Signed)
09/01/2018  2:29 PM  PATIENT:  Martha Washington  53 y.o. female  PRE-OPERATIVE DIAGNOSIS:  Spondylolisthesis  POST-OPERATIVE DIAGNOSIS:  Spondylolisthesis  PROCEDURE:  Procedure(s): POSTERIOR LUMBAR INTERBODY FUSION LUMBAR FOUR-LUMBAR FIVE - Posterior Lateral and Interbody fusion (N/A)  SURGEON:  Surgeon(s) and Role:    * Julio Sicks, MD - Primary  PHYSICIAN ASSISTANT:   ASSISTANTSDoran Durand, NP   ANESTHESIA:   general  EBL:  100 mL   BLOOD ADMINISTERED:none  DRAINS: none   LOCAL MEDICATIONS USED:  MARCAINE     SPECIMEN:  No Specimen  DISPOSITION OF SPECIMEN:  N/A  COUNTS:  YES  TOURNIQUET:  * No tourniquets in log *  DICTATION: .Dragon Dictation  PLAN OF CARE: Admit to inpatient   PATIENT DISPOSITION:  PACU - hemodynamically stable.   Delay start of Pharmacological VTE agent (>24hrs) due to surgical blood loss or risk of bleeding: yes

## 2018-09-02 MED ORDER — HYDROXYZINE HCL 50 MG/ML IM SOLN: 50.0000 mg | Freq: Four times a day (QID) | INTRAMUSCULAR | Status: DC | PRN

## 2018-09-02 MED ORDER — TRAMADOL HCL 50 MG PO TABS: 50.0000 mg | ORAL_TABLET | Freq: Four times a day (QID) | ORAL | 0 refills | Status: AC | PRN

## 2018-09-02 MED ORDER — CYCLOBENZAPRINE HCL 5 MG PO TABS: 5.0000 mg | ORAL_TABLET | Freq: Three times a day (TID) | ORAL | 2 refills | Status: DC | PRN

## 2018-09-02 NOTE — Discharge Summary (Signed)
Physician Discharge Summary  Patient ID: [name] MRN: [id number] DOB/AGE: 09-Oct-1965 53 y.o.  Admit date: 09/01/2018 Discharge date: 09/02/2018  Admission Diagnoses:  Degenerative spondylolisthesis  Discharge Diagnoses:  Same Active Problems:   Degenerative spondylolisthesis   Discharged Condition: Stable  Hospital Course:  Martha Washington is a 53 y.o. female who was admitted for the below procedure. There were no post operative complications. At time of discharge, pain was well controlled, ambulating with Pt/OT, tolerating po, voiding normal. Ready for discharge.  Treatments: Surgery -  Bilateral L4-L5 decompressive laminotomies and facetectomies and foraminotomies, more than would be required for simple interbody fusion alone.  Bilateral L4-5 Ponte osteotomies for sagittal plane balance restoration  L4-5 posterior lumbar interbody fusion utilizing interbody cages and locally harvested autograft  L4-5 posterior lateral arthrodesis utilizing nonsegmental pedicle screw fixation and local autograft  Discharge Exam: Blood pressure (!) 97/51, pulse 75, temperature 98.8 F (37.1 C), temperature source Oral, resp. rate 19, height 5\' 4"  (1.626 m), weight 86.2 kg, last menstrual period 07/05/2013, SpO2 100 %. Awake, alert, oriented Speech fluent, appropriate CN grossly intact 5/5 BUE/BLE Wound c/d/i  Disposition: Discharge disposition: 01-Home or Self Care       Discharge Instructions    Call MD for:  difficulty breathing, headache or visual disturbances   Complete by:  As directed    Call MD for:  persistant dizziness or light-headedness   Complete by:  As directed    Call MD for:  redness, tenderness, or signs of infection (pain, swelling, redness, odor or green/yellow discharge around incision site)   Complete by:  As directed    Call MD for:  severe uncontrolled pain   Complete by:  As directed    Call MD for:  temperature >100.4   Complete by:  As  directed    Diet general   Complete by:  As directed    Driving Restrictions   Complete by:  As directed    Do not drive until given clearance.   Increase activity slowly   Complete by:  As directed    Lifting restrictions   Complete by:  As directed    Do not lift anything >10lbs. Avoid bending and twisting in awkward positions. Avoid bending at the back.   May shower / Bathe   Complete by:  As directed    In 24 hours. Okay to wash wound with warm soapy water. Avoid scrubbing the wound. Pat dry.   Remove dressing in 24 hours   Complete by:  As directed      Allergies as of 09/02/2018      Reactions   Azithromycin    Stomach Cramps   Morphine And Related Nausea And Vomiting   Oxycodone    "passed out when taking after last surgery"      Medication List    TAKE these medications   cyclobenzaprine 5 MG tablet Commonly known as:  FLEXERIL Take 1 tablet (5 mg total) by mouth 3 (three) times daily as needed for muscle spasms.   diphenhydrAMINE 25 MG tablet Commonly known as:  BENADRYL Take 25 mg by mouth at bedtime as needed.   OVER THE COUNTER MEDICATION Take 1 capsule by mouth daily. Epicor   traMADol 50 MG tablet Commonly known as:  ULTRAM Take 1 tablet (50 mg total) by mouth every 6 (six) hours as needed.            Durable Medical Equipment  (From admission, onward)  Start     Ordered   09/01/18 1617  DME Walker rolling  Once    Question:  Patient needs a walker to treat with the following condition  Answer:  Degenerative spondylolisthesis   09/01/18 1616   09/01/18 1617  DME 3 n 1  Once     09/01/18 1616         Follow-up Information    Julio SicksPool, Henry, MD Follow up.   Specialty:  Neurosurgery Contact information: [address] [place] [address] [phone]           Signed: Alyson InglesCOSTELLA, Dreonna Hussein J 09/02/2018, 7:17 AM

## 2018-09-02 NOTE — Evaluation (Signed)
Physical Therapy Evaluation Patient Details Name: Martha Washington MRN: [id number] DOB: [birth year] Today's Date: 09/02/2018   History of Present Illness  (P) Pt is a 53 y/o female who presents s/p L4-L5 PLIF on 09/01/2018. PMH significant for tachycardia, a-fib, rotator cuff repair x2 RUE.   Clinical Impression  Pt admitted with above diagnosis. Pt currently with functional limitations due to the deficits listed below (see PT Problem List). At the time of PT eval pt was able to perform transfers and ambulation with up to supervision for safety and no AD. Pt with increased pain and tearful at times throughout mobility. Overall tolerated well. Pt and husband were educated on brace application/wearing schedule, precautions, stair negotiation, car transfer, and safe activity progression. Pt will benefit from skilled PT to increase their independence and safety with mobility to allow discharge to the venue listed below.     Follow Up Recommendations No PT follow up;Supervision - Intermittent    Equipment Recommendations  None recommended by PT    Recommendations for Other Services       Precautions / Restrictions Precautions Precautions: (P) Fall;Back Precaution Booklet Issued: (P) No Precaution Comments: (P) Reviewed handout with pt and husband. Pt was cued for precautions during functional mobility.  Required Braces or Orthoses: (P) Spinal Brace Spinal Brace: (P) Lumbar corset;Applied in sitting position Restrictions Weight Bearing Restrictions: (P) No      Mobility  Bed Mobility Overal bed mobility: Modified Independent Bed Mobility: Rolling;Sidelying to Sit           General bed mobility comments: Pt was able to transition to EOB without assist. HOB flat and rails lowered to simulate home environment.   Transfers Overall transfer level: Needs assistance Equipment used: None Transfers: Sit to/from Stand Sit to Stand: Supervision         General transfer comment:  Supervision for safety as pt powered up to full stand. Increased time required due to pain.   Ambulation/Gait Ambulation/Gait assistance: Supervision Gait Distance (Feet): 200 Feet Assistive device: None Gait Pattern/deviations: Step-through pattern;Decreased stride length;Trunk flexed Gait velocity: Decreased Gait velocity interpretation: 1.31 - 2.62 ft/sec, indicative of limited community ambulator General Gait Details: VC's for improved posture. Pt was ambulating slow but generally steady without overt LOB. Tearful at times due to pain.   Stairs Stairs: Yes Stairs assistance: Min guard Stair Management: One rail Right;Step to pattern;Forwards Number of Stairs: 4 General stair comments: VC's for sequencing and general safety. Pt managed well with minimal increased pain.  Wheelchair Mobility    Modified Rankin (Stroke Patients Only)       Balance Overall balance assessment: Needs assistance Sitting-balance support: Feet supported;No upper extremity supported Sitting balance-Leahy Scale: Fair     Standing balance support: No upper extremity supported;During functional activity Standing balance-Leahy Scale: Fair Standing balance comment: Guarded but no assist required                             Pertinent Vitals/Pain Pain Assessment: (P) 0-10 Faces Pain Scale: Hurts whole lot Pain Location: B thighs/hips and incision site Pain Descriptors / Indicators: Operative site guarding;Crying;Aching Pain Intervention(s): Limited activity within patient's tolerance;Monitored during session;Repositioned;Heat applied(to hips)    Home Living Family/patient expects to be discharged to:: (P) Private residence Living Arrangements: (P) Spouse/significant other Available Help at Discharge: (P) Family Type of Home: (P) House Home Access: (P) Stairs to enter Entrance Stairs-Rails: (P) Right;Left Entrance Stairs-Number of Steps: (P) 4 Home Layout: (  P) One level Home  Equipment: (P) Walker - 2 wheels;Bedside commode;Tub bench      Prior Function Level of Independence: (P) Independent               Hand Dominance   Dominant Hand: (P) Right    Extremity/Trunk Assessment   Upper Extremity Assessment Upper Extremity Assessment: Overall WFL for tasks assessed    Lower Extremity Assessment Lower Extremity Assessment: RLE deficits/detail;LLE deficits/detail RLE Deficits / Details: Bilteral weakness and pain in hips/thighs that pt reports was not present before surgery. Pt reports LLE is more painful than RLE. Heat packs applied to thighs at end of session.     Cervical / Trunk Assessment Cervical / Trunk Assessment: Other exceptions Cervical / Trunk Exceptions: s/p surgery  Communication   Communication: (P) No difficulties  Cognition Arousal/Alertness: Awake/alert Behavior During Therapy: WFL for tasks assessed/performed Overall Cognitive Status: Within Functional Limits for tasks assessed                                        General Comments      Exercises     Assessment/Plan    PT Assessment Patient needs continued PT services  PT Problem List Decreased strength;Decreased activity tolerance;Decreased balance;Decreased mobility;Decreased knowledge of use of DME;Decreased safety awareness;Decreased knowledge of precautions;Pain       PT Treatment Interventions DME instruction;Gait training;Stair training;Functional mobility training;Therapeutic activities;Therapeutic exercise;Neuromuscular re-education;Patient/family education    PT Goals (Current goals can be found in the Care Plan section)  Acute Rehab PT Goals Patient Stated Goal: Decrease pain; be able to get up into her truck PT Goal Formulation: With patient Time For Goal Achievement: 09/09/18 Potential to Achieve Goals: Good    Frequency Min 5X/week   Barriers to discharge        Co-evaluation               AM-PAC PT "6 Clicks" Mobility   Outcome Measure Help needed turning from your back to your side while in a flat bed without using bedrails?: None Help needed moving from lying on your back to sitting on the side of a flat bed without using bedrails?: None Help needed moving to and from a bed to a chair (including a wheelchair)?: None Help needed standing up from a chair using your arms (e.g., wheelchair or bedside chair)?: None Help needed to walk in hospital room?: A Little Help needed climbing 3-5 steps with a railing? : A Little 6 Click Score: 22    End of Session Equipment Utilized During Treatment: Gait belt;Back brace Activity Tolerance: Patient tolerated treatment well Patient left: in chair;with call bell/phone within reach;with family/visitor present Nurse Communication: Mobility status PT Visit Diagnosis: Unsteadiness on feet (R26.81);Pain Pain - Right/Left: Left Pain - part of body: Leg(hips (bilateral) and back)    Time: 6754-4920 PT Time Calculation (min) (ACUTE ONLY): 24 min   Charges:   PT Evaluation $PT Eval Moderate Complexity: 1 Mod PT Treatments $Gait Training: 8-22 mins        Conni Slipper, PT, DPT Acute Rehabilitation Services Pager: [phone] Office: [phone]   Marylynn Pearson 09/02/2018, 8:31 AM

## 2018-09-02 NOTE — Evaluation (Signed)
Occupational Therapy Evaluation Patient Details Name: Martha [place] MRN: [id number] DOB: 14-Dec-1965 Today's Date: 09/02/2018    History of Present Illness Pt is a 53 y/o female who presents s/p L4-L5 PLIF on 09/01/2018. PMH significant for tachycardia, a-fib, rotator cuff repair x2 RUE.    Clinical Impression   Patient is s/p  L4-L5 PLIF surgery resulting in the deficits listed below (see OT Problem List). The patient is going to return home with husband and daughter will be there 24/7. The patient was able to report 3/3 back precautions and was able to demonstrate donning brace. Patient reported she is will be able to get 3 in1 and bench for showering. It was recommended to use reacher and long handle shoe horn for LE dressing and long handle sponge of LE bathing. Pateint and husband reported an understanding.       Follow Up Recommendations  No OT follow up    Equipment Recommendations       Recommendations for Other Services       Precautions / Restrictions Precautions Precautions: Fall;Back Precaution Booklet Issued: No Precaution Comments: Reviewed handout with pt and husband. Pt was cued for precautions during functional mobility.  Required Braces or Orthoses: Spinal Brace Spinal Brace: Lumbar corset;Applied in sitting position Restrictions Weight Bearing Restrictions: No      Mobility Bed Mobility Overal bed mobility: (recieved in chair) Bed Mobility: Rolling;Sidelying to Sit           General bed mobility comments: Pt was able to transition to EOB without assist. HOB flat and rails lowered to simulate home environment.   Transfers Overall transfer level: Needs assistance Equipment used: None Transfers: Sit to/from Stand Sit to Stand: Supervision         General transfer comment: Supervision for safety as pt powered up to full stand. Increased time required due to pain.     Balance Overall balance assessment: Needs assistance Sitting-balance support:  Feet supported;No upper extremity supported Sitting balance-Leahy Scale: Fair     Standing balance support: No upper extremity supported;During functional activity Standing balance-Leahy Scale: Fair Standing balance comment: Guarded but no assist required                           ADL either performed or assessed with clinical judgement   ADL Overall ADL's : Needs assistance/impaired Eating/Feeding: Independent;Sitting   Grooming: Wash/dry hands;Modified independent   Upper Body Bathing: Modified independent;Sitting   Lower Body Bathing: Supervison/ safety;Cueing for compensatory techniques;Cueing for back precautions   Upper Body Dressing : Modified independent;Sitting   Lower Body Dressing: Supervision/safety;Cueing for compensatory techniques;Cueing for back precautions;Sit to/from stand(use )   Toilet Transfer: Modified Independent   Toileting- Clothing Manipulation and Hygiene: Modified independent;Cueing for back precautions   Tub/ Shower Transfer: Walk-in shower;Cueing for sequencing;Supervision/safety   Functional mobility during ADLs: Supervision/safety;Cueing for sequencing       Vision Baseline Vision/History: No visual deficits Patient Visual Report: No change from baseline Vision Assessment?: No apparent visual deficits     Perception Perception Perception Tested?: No   Praxis Praxis Praxis tested?: Within functional limits    Pertinent Vitals/Pain Pain Assessment: 0-10 Pain Score: 7  Faces Pain Scale: Hurts whole lot Pain Location: B thighs/hips and incision site Pain Descriptors / Indicators: Operative site guarding;Crying;Aching Pain Intervention(s): Limited activity within patient's tolerance;Monitored during session     Hand Dominance Right   Extremity/Trunk Assessment Upper Extremity Assessment Upper Extremity Assessment: Overall WFL for  tasks assessed   Lower Extremity Assessment Lower Extremity Assessment: Defer to PT  evaluation RLE Deficits / Details: Bilteral weakness and pain in hips/thighs that pt reports was not present before surgery. Pt reports LLE is more painful than RLE. Heat packs applied to thighs at end of session.    Cervical / Trunk Assessment Cervical / Trunk Assessment: Other exceptions Cervical / Trunk Exceptions: s/p surgery   Communication Communication Communication: No difficulties   Cognition Arousal/Alertness: Awake/alert Behavior During Therapy: WFL for tasks assessed/performed Overall Cognitive Status: Within Functional Limits for tasks assessed                                     General Comments       Exercises     Shoulder Instructions      Home Living Family/patient expects to be discharged to:: Private residence Living Arrangements: Spouse/significant other Available Help at Discharge: Family Type of Home: House Home Access: Stairs to enter Secretary/administrator of Steps: 4 Entrance Stairs-Rails: Right;Left Home Layout: One level     Bathroom Shower/Tub: Chief Strategy Officer: Standard Bathroom Accessibility: Yes How Accessible: Accessible via walker Home Equipment: Walker - 2 wheels;Bedside commode;Tub bench          Prior Functioning/Environment Level of Independence: Independent                 OT Problem List: Decreased safety awareness;Decreased knowledge of use of DME or AE;Decreased knowledge of precautions;Pain;Decreased activity tolerance      OT Treatment/Interventions:      OT Goals(Current goals can be found in the care plan section) Acute Rehab OT Goals Patient Stated Goal: go home  OT Goal Formulation: With patient Time For Goal Achievement: 09/09/18 Potential to Achieve Goals: Good  OT Frequency:     Barriers to D/C:            Co-evaluation              AM-PAC OT "6 Clicks" Daily Activity     Outcome Measure Help from another person eating meals?: None Help from another person  taking care of personal grooming?: None Help from another person toileting, which includes using toliet, bedpan, or urinal?: None Help from another person bathing (including washing, rinsing, drying)?: A Little Help from another person to put on and taking off regular upper body clothing?: None Help from another person to put on and taking off regular lower body clothing?: A Little 6 Click Score: 22   End of Session    Activity Tolerance: Patient tolerated treatment well;Patient limited by pain Patient left: in chair;with family/visitor present  OT Visit Diagnosis: Unsteadiness on feet (R26.81);Muscle weakness (generalized) (M62.81);Pain Pain - part of body: (back)                Time: 3383-2919 OT Time Calculation (min): 33 min Charges:  OT General Charges $OT Visit: 1 Visit OT Evaluation $OT Eval Low Complexity: 1 Low OT Treatments $Self Care/Home Management : 23-37 mins  Alphia Moh OTR/L  Acute Rehab Services  [phone] office number [phone] pager number   Alphia Moh 09/02/2018, 8:38 AM

## 2018-09-02 NOTE — Progress Notes (Signed)
  NEUROSURGERY PROGRESS NOTE   No issues overnight.  Complains of appropriate back soreness Pre op pain is much improved Ambulating in halls. Voiding normal.  Eager for d/c  EXAM:  BP (!) 99/59 (BP Location: Right Arm)   Pulse 81   Temp 99.5 F (37.5 C) (Oral)   Resp 20   Ht 5\' 4"  (1.626 m)   Wt 86.2 kg   LMP 07/05/2013   SpO2 100%   BMI 32.61 kg/m   Awake, alert, oriented  Speech fluent, appropriate  CN grossly intact  5/5 BUE/BLE  Incision c/d/i  PLAN Doing well this am Will work with PT/OT If does well, will d/c home.

## 2018-09-02 NOTE — Progress Notes (Signed)
Patient alert and oriented, Martha [place]'s well, voiding adequate amount of urine, swallowing without difficulty, c/o moderate pain and meds given prior to discharged for ride and discomfort. Patient discharged home with family. Script and discharged instructions given to patient. Patient and family stated understanding of instructions given. Patient has an appointment with Dr. Pool 

## 2018-09-05 MED FILL — Sodium Chloride IV Soln 0.9%: INTRAVENOUS | Qty: 1000 | Status: AC

## 2018-09-05 MED FILL — Heparin Sodium (Porcine) Inj 1000 Unit/ML: INTRAMUSCULAR | Qty: 30 | Status: AC

## 2018-10-05 DIAGNOSIS — M4310 Spondylolisthesis, site unspecified: Secondary | ICD-10-CM | POA: Diagnosis not present

## 2018-10-19 DIAGNOSIS — Z0000 Encounter for general adult medical examination without abnormal findings: Secondary | ICD-10-CM | POA: Diagnosis not present

## 2018-10-19 DIAGNOSIS — Z13220 Encounter for screening for lipoid disorders: Secondary | ICD-10-CM | POA: Diagnosis not present

## 2019-07-09 ENCOUNTER — Other Ambulatory Visit: Payer: Self-pay | Admitting: Gastroenterology

## 2019-07-09 DIAGNOSIS — R1011 Right upper quadrant pain: Secondary | ICD-10-CM

## 2019-07-17 ENCOUNTER — Other Ambulatory Visit: Payer: Self-pay | Admitting: Family Medicine

## 2019-07-17 DIAGNOSIS — Z1231 Encounter for screening mammogram for malignant neoplasm of breast: Secondary | ICD-10-CM

## 2019-07-20 ENCOUNTER — Ambulatory Visit (HOSPITAL_BASED_OUTPATIENT_CLINIC_OR_DEPARTMENT_OTHER)
Admission: RE | Admit: 2019-07-20 | Discharge: 2019-07-20 | Disposition: A | Discharge status: Home / Self Care | Payer: 59 | Source: Ambulatory Visit | Attending: Family Medicine | Admitting: Family Medicine

## 2019-07-20 ENCOUNTER — Other Ambulatory Visit: Payer: Self-pay

## 2019-07-20 ENCOUNTER — Other Ambulatory Visit (HOSPITAL_BASED_OUTPATIENT_CLINIC_OR_DEPARTMENT_OTHER): Payer: Self-pay | Admitting: Family Medicine

## 2019-07-20 DIAGNOSIS — M7989 Other specified soft tissue disorders: Secondary | ICD-10-CM

## 2019-07-25 ENCOUNTER — Ambulatory Visit
Admission: RE | Admit: 2019-07-25 | Discharge: 2019-07-25 | Disposition: A | Discharge status: Home / Self Care | Payer: 59 | Source: Ambulatory Visit | Attending: Gastroenterology | Admitting: Gastroenterology

## 2019-07-25 DIAGNOSIS — R1011 Right upper quadrant pain: Secondary | ICD-10-CM

## 2019-07-27 ENCOUNTER — Other Ambulatory Visit: Payer: Self-pay | Admitting: Gastroenterology

## 2019-07-27 DIAGNOSIS — R932 Abnormal findings on diagnostic imaging of liver and biliary tract: Secondary | ICD-10-CM

## 2019-07-30 ENCOUNTER — Other Ambulatory Visit: Payer: Self-pay

## 2019-07-30 ENCOUNTER — Ambulatory Visit
Admission: RE | Admit: 2019-07-30 | Discharge: 2019-07-30 | Disposition: A | Discharge status: Home / Self Care | Payer: 59 | Source: Ambulatory Visit | Attending: Gastroenterology | Admitting: Gastroenterology

## 2019-07-30 DIAGNOSIS — R932 Abnormal findings on diagnostic imaging of liver and biliary tract: Secondary | ICD-10-CM

## 2019-07-30 MED ORDER — GADOBENATE DIMEGLUMINE 529 MG/ML IV SOLN
18.0000 mL | Freq: Once | INTRAVENOUS | Status: AC | PRN
  Administered 2019-07-30: 18 mL via INTRAVENOUS

## 2020-11-14 DIAGNOSIS — R55 Syncope and collapse: Secondary | ICD-10-CM | POA: Insufficient documentation

## 2020-11-28 ENCOUNTER — Other Ambulatory Visit: Payer: Self-pay | Admitting: Gastroenterology

## 2020-11-28 DIAGNOSIS — R1013 Epigastric pain: Secondary | ICD-10-CM

## 2020-12-08 ENCOUNTER — Ambulatory Visit: Payer: 59 | Admitting: Neurology

## 2020-12-08 ENCOUNTER — Encounter: Payer: Self-pay | Admitting: Neurology

## 2020-12-08 VITALS — BP 123/66 | HR 62 | Ht 64.0 in | Wt 223.0 lb

## 2020-12-08 DIAGNOSIS — R55 Syncope and collapse: Secondary | ICD-10-CM | POA: Diagnosis not present

## 2020-12-08 DIAGNOSIS — R569 Unspecified convulsions: Secondary | ICD-10-CM

## 2020-12-08 NOTE — Progress Notes (Signed)
[facility] [address] [place]. [place] [address] [phone]       OFFICE CONSULT NOTE  [name] Date of Birth:  11/17/1965 Medical Record Number:  [id number]   Referring MD: Jarrett Soho  Reason for Referral: Seizure  HPI: Martha [place] is a 55 year old Caucasian lady seen today for initial office consultation visit.  She is accompanied by her husband.  History is obtained from them and review of referral notes.  I personally reviewed available imaging films in PACS.  She has past medical history of gastroesophageal reflux disease, anxiety and depression, migraines.  She had an episode on 11/14/2020 with brief loss of consciousness.  She states she had eaten out earlier at cookout and ate a piece of chicken which she felt had not been cooking and upset her stomach.  She felt nauseous throughout the day but did not throw up.  She had trouble sleeping.  At about midnight she got up to go to the restroom and remembers having peed and after that she lost consciousness for around 10 minutes.  When she woke up she had vomited and she had hit her forehead.  She had a tongue bite.  She does not recall exactly how she fell and hit her self.  She denied any preceding chest pain, palpitations, dizziness, tunnel vision.  She states she is done well since then with she did have some dizziness which she describes as some blurred vision when she moved.  She saw chiropractor recently who did some adjustments and that feeling also has resolved.  She had some lab work done at primary physician's office including TSH, comprehensive metabolic panel and CBC with differential all of which was unremarkable.  She has not had any brain imaging study done.  She does have remote history of transient atrial fibrillation in 2018 following bunionectomy.  She saw cardiology at that time and due to low ABCD 2 squared score was not placed on anticoagulation.  She denies any prior history of  seizures, febrile seizures as a child, significant head injury with loss of consciousness except the present episode.  No family history of epilepsy.  She does admit to prior history of syncope once when she had had some surgery and took some oxycodone.  She regained consciousness quickly at that time did not have a tongue bite.  ROS:   14 system review of systems is positive for nausea, dizziness, concussion, tongue bite, loss of consciousness, vomiting, tiredness and all other systems negative  PMH:  Past Medical History:  Diagnosis Date  . Atrial fibrillation with RVR (HCC)   . GERD (gastroesophageal reflux disease)    no meds  . High cholesterol   . Menorrhagia 07/13/2013  . Missed abortion    resolved - no surgery required  . New onset atrial fibrillation (HCC) 08/04/2017   with RVR to the 160s; S/P hammertoe OR/notes 08/04/2017  . S/P bunionectomy 08/04/2017  . SVD (spontaneous vaginal delivery) X 2  . Tachycardia 08/04/2017    Social History:  Social History   Socioeconomic History  . Marital status: Married    Spouse name: Loraine Leriche  . Number of children: Not on file  . Years of education: Not on file  . Highest education level: Not on file  Occupational History  . Not on file  Tobacco Use  . Smoking status: Never Smoker  . Smokeless tobacco: Never Used  Vaping Use  . Vaping Use: Never used  Substance and Sexual Activity  .  Alcohol use: No  . Drug use: No  . Sexual activity: Not on file  Other Topics Concern  . Not on file  Social History Narrative   Lives with husband   Right Handed   Drinks 1 cup caffeine daily   Social Determinants of Health   Financial Resource Strain: Not on file  Food Insecurity: Not on file  Transportation Needs: Not on file  Physical Activity: Not on file  Stress: Not on file  Social Connections: Not on file  Intimate Partner Violence: Not on file    Medications:   Current Outpatient Medications on File Prior to Visit   Medication Sig Dispense Refill  . cyclobenzaprine (FLEXERIL) 5 MG tablet Take 1 tablet (5 mg total) by mouth 3 (three) times daily as needed for muscle spasms. 60 tablet 2  . diphenhydrAMINE (BENADRYL) 25 MG tablet Take 25 mg by mouth at bedtime as needed.    Marland Kitchen OVER THE COUNTER MEDICATION Take 1 capsule by mouth daily. Epicor     No current facility-administered medications on file prior to visit.    Allergies:   Allergies  Allergen Reactions  . Azithromycin     Stomach Cramps  . Morphine And Related Nausea And Vomiting  . Oxycodone     "passed out when taking after last surgery"    Physical Exam General: Mildly obese pleasant middle-age Caucasian lady, seated, in no evident distress Head: head normocephalic and atraumatic.   Neck: supple with no carotid or supraclavicular bruits Cardiovascular: regular rate and rhythm, no murmurs Musculoskeletal: no deformity Skin:  no rash/petichiae Vascular:  Normal pulses all extremities  Neurologic Exam Mental Status: Awake and fully alert. Oriented to place and time. Recent and remote memory intact. Attention span, concentration and fund of knowledge appropriate. Mood and affect appropriate.  Cranial Nerves: Fundoscopic exam reveals sharp disc margins. Pupils equal, briskly reactive to light. Extraocular movements full without nystagmus. Visual fields full to confrontation. Hearing intact. Facial sensation intact. Face, tongue, palate moves normally and symmetrically.  Motor: Normal bulk and tone. Normal strength in all tested extremity muscles. Sensory.: intact to touch , pinprick , position and vibratory sensation.  Coordination: Rapid alternating movements normal in all extremities. Finger-to-nose and heel-to-shin performed accurately bilaterally. Gait and Station: Arises from chair without difficulty. Stance is normal. Gait demonstrates normal stride length and balance . Able to heel, toe and tandem walk without difficulty.  Reflexes: 1+  and symmetric. Toes downgoing.      ASSESSMENT: 55 year old Caucasian lady with unwitnessed episode of brief loss of consciousness with resultant tongue bite and confusion likely convulsive syncope.  Unwitnessed seizure with postictal state possible though less likely.     PLAN: I had a long discussion with the patient and her husband regarding her episode of brief loss of consciousness with confusion and and tongue bite possibly of convulsive syncopal event with mild concussion.  Seizure is possible though less likely.  She has had previous history of syncope in the past as well.  Recommend further evaluation with checking MRI scan of the brain and EEG.  She was advised not to drive for the next 6 months as per [facility].  I encouraged her to keep her self well-hydrated.  She will return for follow-up in the future in 3 months or call earlier if necessary.  Greater than 50% time during this 45-minute consultation visit was spent on counseling and coordination of care about episode of convulsive syncope and answering questions Delia Heady, MD  [facility] [address] [place], [place] [address]  Phone [phone] Fax [phone] Note: This document was prepared with digital dictation and possible smart phrase technology. Any transcriptional errors that result from this process are unintentional.

## 2020-12-08 NOTE — Patient Instructions (Signed)
I had a long discussion with the patient and her husband regarding her episode of brief loss of consciousness with confusion and and tongue bite possibly of convulsive syncopal event with mild concussion.  Seizure is possible though less likely.  She has had previous history of syncope in the past as well.  Recommend further evaluation with checking MRI scan of the brain and EEG.  She was advised not to drive for the next 6 months as per [facility].  I encouraged her to keep her self well-hydrated.  She will return for follow-up in the future in 3 months or call earlier if necessary.  Syncope Syncope is when you pass out (faint) for a short time. It is caused by a sudden decrease in blood flow to the brain. Signs that you may be about to pass out include:  Feeling dizzy or light-headed.  Feeling sick to your stomach (nauseous).  Seeing all white or all black.  Having cold, clammy skin. If you pass out, get help right away. Call your local emergency services (911 in the [place] Do not drive yourself to the hospital. Follow these instructions at home: Watch for any changes in your symptoms. Take these actions to stay safe and help with your symptoms: Lifestyle  Do not drive, use machinery, or play sports until your doctor says it is okay.  Do not drink alcohol.  Do not use any products that contain nicotine or tobacco, such as cigarettes and e-cigarettes. If you need help quitting, ask your doctor.  Drink enough fluid to keep your pee (urine) pale yellow. General instructions  Take over-the-counter and prescription medicines only as told by your doctor.  If you are taking blood pressure or heart medicine, sit up and stand up slowly. Spend a few minutes getting ready to sit and then stand. This can help you feel less dizzy.  Have someone stay with you until you feel stable.  If you start to feel like you might pass out, lie down right away and raise (elevate) your feet above the  level of your heart. Breathe deeply and steadily. Wait until all of the symptoms are gone.  Keep all follow-up visits as told by your doctor. This is important. Get help right away if:  You have a very bad headache.  You pass out once or more than once.  You have pain in your chest, belly, or back.  You have a very fast or uneven heartbeat (palpitations).  It hurts to breathe.  You are bleeding from your mouth or your bottom (rectum).  You have black or tarry poop (stool).  You have jerky movements that you cannot control (seizure).  You are confused.  You have trouble walking.  You are very weak.  You have vision problems. These symptoms may be an emergency. Do not wait to see if the symptoms will go away. Get medical help right away. Call your local emergency services (911 in the [place] Do not drive yourself to the hospital. Summary  Syncope is when you pass out (faint) for a short time. It is caused by a sudden decrease in blood flow to the brain.  Signs that you may be about to faint include feeling dizzy, light-headed, or sick to your stomach, seeing all white or all black, or having cold, clammy skin.  If you start to feel like you might pass out, lie down right away and raise (elevate) your feet above the level of your heart. Breathe deeply and steadily.  Wait until all of the symptoms are gone. This information is not intended to replace advice given to you by your health care provider. Make sure you discuss any questions you have with your health care provider. Document Revised: 09/05/2019 Document Reviewed: 09/07/2017 Elsevier Patient Education  2021 [employer].

## 2020-12-11 ENCOUNTER — Telehealth: Payer: Self-pay | Admitting: Neurology

## 2020-12-11 NOTE — Telephone Encounter (Signed)
Pt would like something prescribed to her for her MRI as she is claustrophobic.

## 2020-12-11 NOTE — Telephone Encounter (Signed)
MR Brain w/wo contrast 60 mins Dr. Pearlean Brownie no to covid questions [facility] NPR via [facility] website JM  Scheduled at [facility]

## 2020-12-12 ENCOUNTER — Other Ambulatory Visit: Payer: Self-pay | Admitting: Neurology

## 2020-12-12 MED ORDER — ALPRAZOLAM 0.25 MG PO TABS: 0.2500 mg | ORAL_TABLET | ORAL | 0 refills | Status: AC

## 2020-12-12 NOTE — Telephone Encounter (Signed)
Ok will prescribe xanax

## 2020-12-17 ENCOUNTER — Ambulatory Visit: Payer: 59

## 2020-12-17 DIAGNOSIS — R55 Syncope and collapse: Secondary | ICD-10-CM | POA: Diagnosis not present

## 2020-12-17 MED ORDER — GADOBENATE DIMEGLUMINE 529 MG/ML IV SOLN
20.0000 mL | Freq: Once | INTRAVENOUS | Status: AC | PRN
  Administered 2020-12-17: 20 mL via INTRAVENOUS

## 2020-12-19 NOTE — Progress Notes (Signed)
Kindly inform the patient that brain MRI scan was normal.

## 2020-12-22 ENCOUNTER — Telehealth: Payer: Self-pay | Admitting: Emergency Medicine

## 2020-12-22 NOTE — Telephone Encounter (Signed)
-----   Message from Micki Riley, MD sent at 12/19/2020  1:46 PM EDT ----- Joneen Roach inform the patient that brain MRI scan was normal.

## 2020-12-24 ENCOUNTER — Ambulatory Visit
Admission: RE | Admit: 2020-12-24 | Discharge: 2020-12-24 | Disposition: A | Discharge status: Home / Self Care | Payer: 59 | Source: Ambulatory Visit | Attending: Gastroenterology | Admitting: Gastroenterology

## 2020-12-24 DIAGNOSIS — R1013 Epigastric pain: Secondary | ICD-10-CM

## 2020-12-25 ENCOUNTER — Ambulatory Visit: Payer: 59 | Admitting: Neurology

## 2020-12-25 ENCOUNTER — Other Ambulatory Visit: Payer: Self-pay

## 2020-12-25 DIAGNOSIS — R55 Syncope and collapse: Secondary | ICD-10-CM

## 2020-12-26 NOTE — Progress Notes (Signed)
Kindly inform the patient that EEG study was normal

## 2021-01-01 ENCOUNTER — Telehealth: Payer: Self-pay | Admitting: Emergency Medicine

## 2021-01-01 NOTE — Telephone Encounter (Signed)
-----   Message from Micki Riley, MD sent at 12/26/2020  8:58 AM EDT ----- Joneen Roach inform the patient that EEG study was normal

## 2021-03-05 ENCOUNTER — Ambulatory Visit: Payer: 59 | Admitting: Neurology

## 2021-03-05 ENCOUNTER — Encounter: Payer: Self-pay | Admitting: Neurology

## 2021-03-05 VITALS — BP 108/76 | HR 76 | Ht 64.0 in | Wt 218.2 lb

## 2021-03-05 DIAGNOSIS — R4020 Unspecified coma: Secondary | ICD-10-CM

## 2021-03-05 DIAGNOSIS — R55 Syncope and collapse: Secondary | ICD-10-CM

## 2021-03-05 NOTE — Progress Notes (Signed)
[facility] [address] [place]. [place] [address] [phone]       OFFICE FOLLOW UP VISIT NOTE  [name] Date of Birth:  [birth year] Medical Record Number:  [id number]   Referring MD: Jarrett Soho  Reason for Referral: Seizure  HPI: Initial visit 12/08/2020 Martha Washington is a 55 year old Caucasian lady seen today for initial office consultation visit.  She is accompanied by her husband.  History is obtained from them and review of referral notes.  I personally reviewed available imaging films in PACS.  She has past medical history of gastroesophageal reflux disease, anxiety and depression, migraines.  She had an episode on 11/14/2020 with brief loss of consciousness.  She states she had eaten out earlier at cookout and ate a piece of chicken which she felt had not been cooking and upset her stomach.  She felt nauseous throughout the day but did not throw up.  She had trouble sleeping.  At about midnight she got up to go to the restroom and remembers having peed and after that she lost consciousness for around 10 minutes.  When she woke up she had vomited and she had hit her forehead.  She had a tongue bite.  She does not recall exactly how she fell and hit her self.  She denied any preceding chest pain, palpitations, dizziness, tunnel vision.  She states she is done well since then with she did have some dizziness which she describes as some blurred vision when she moved.  She saw chiropractor recently who did some adjustments and that feeling also has resolved.  She had some lab work done at primary physician's office including TSH, comprehensive metabolic panel and CBC with differential all of which was unremarkable.  She has not had any brain imaging study done.  She does have remote history of transient atrial fibrillation in 2018 following bunionectomy.  She saw cardiology at that time and due to 0 CHA2DS2-VASc score was not placed on anticoagulation.  She  denies any prior history of seizures, febrile seizures as a child, significant head injury with loss of consciousness except the present episode.  No family history of epilepsy.  She does admit to prior history of syncope once when she had had some surgery and took some oxycodone.  She regained consciousness quickly at that time did not have a tongue bite. Update 03/05/2021: She returns for follow-up after last visit 2 and half months ago.  She states she is doing well and has not had any further episodes of passing out syncopal events.  She however tells me that she has had 2 prior syncopal events when she underwent foot surgery and was in a lot of pain and took oxycodone but they were brief and these episodes were different from the episode in April when she lost consciousness for a longer period of time and woke up with a tongue bite.  She does have history of transient paroxysmal A. fib in December 2018 in the setting of anesthesia for foot surgery.  She was seen by cardiologist Dr. Clifton James at that time and given her CHA2DS2-VASc score of 0 she was recommended to be on aspirin alone not on anticoagulation.  She denies any subsequent history of palpitations or A. fib episodes.  She had EEG done on 12/25/2020 which was completely normal.  MRI scan of the brain on 12/16/2020 was also normal.  She has not been driving. ROS:   14 system review of systems is positive for nausea,  dizziness, concussion, tongue bite, loss of consciousness, vomiting, tiredness and all other systems negative  PMH:  Past Medical History:  Diagnosis Date   Atrial fibrillation with RVR (HCC)    GERD (gastroesophageal reflux disease)    no meds   High cholesterol    Menorrhagia 07/13/2013   Missed abortion    resolved - no surgery required   New onset atrial fibrillation (HCC) 08/04/2017   with RVR to the 160s; S/P hammertoe OR/notes 08/04/2017   S/P bunionectomy 08/04/2017   SVD (spontaneous vaginal delivery) X 2   Tachycardia  08/04/2017    Social History:  Social History   Socioeconomic History   Marital status: Married    Spouse name: Merchant navy officer   Number of children: Not on file   Years of education: Not on file   Highest education level: Not on file  Occupational History   Not on file  Tobacco Use   Smoking status: Never   Smokeless tobacco: Never  Vaping Use   Vaping Use: Never used  Substance and Sexual Activity   Alcohol use: No   Drug use: No   Sexual activity: Not on file  Other Topics Concern   Not on file  Social History Narrative   Lives with husband   Right Handed   Drinks 1 cup caffeine daily   Social Determinants of Health   Financial Resource Strain: Not on file  Food Insecurity: Not on file  Transportation Needs: Not on file  Physical Activity: Not on file  Stress: Not on file  Social Connections: Not on file  Intimate Partner Violence: Not on file    Medications:   Current Outpatient Medications on File Prior to Visit  Medication Sig Dispense Refill   OVER THE COUNTER MEDICATION Take 1 capsule by mouth daily. Epicor     UNABLE TO FIND Med Name: Lillie Fragmin     No current facility-administered medications on file prior to visit.    Allergies:   Allergies  Allergen Reactions   Erythromycin     Other reaction(s): Other, stomach upset   Azithromycin     Stomach Cramps   Morphine And Related Nausea And Vomiting   Oxycodone     "passed out when taking after last surgery"    Physical Exam General: Mildly obese pleasant middle-age Caucasian lady, seated, in no evident distress Head: head normocephalic and atraumatic.   Neck: supple with no carotid or supraclavicular bruits Cardiovascular: regular rate and rhythm, no murmurs Musculoskeletal: no deformity Skin:  no rash/petichiae Vascular:  Normal pulses all extremities  Neurologic Exam Mental Status: Awake and fully alert. Oriented to place and time. Recent and remote memory intact. Attention span, concentration and fund  of knowledge appropriate. Mood and affect appropriate.  Cranial Nerves: Fundoscopic exam not done. Pupils equal, briskly reactive to light. Extraocular movements full without nystagmus. Visual fields full to confrontation. Hearing intact. Facial sensation intact. Face, tongue, palate moves normally and symmetrically.  Motor: Normal bulk and tone. Normal strength in all tested extremity muscles. Sensory.: intact to touch , pinprick , position and vibratory sensation.  Coordination: Rapid alternating movements normal in all extremities. Finger-to-nose and heel-to-shin performed accurately bilaterally. Gait and Station: Arises from chair without difficulty. Stance is normal. Gait demonstrates normal stride length and balance . Able to heel, toe and tandem walk without difficulty.  Reflexes: 1+ and symmetric. Toes downgoing.      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  Unwitnessed seizure with postictal state possible though less likely.     PLAN: I had a long discussion with the patient regarding her episode of brief loss of consciousness and discussed results of EEG and MRI scan and answered questions.  The exact etiology of the episodes remains unclear as to whether it was a convulsive syncope or unwitnessed seizure.  Since episode was unprovoked risk of recurrence is lifetime less than 50% hence I do not believe starting anticonvulsants is necessary.  Since she has a prior history of paroxysmal A. fib in December 2018 I recommend referral to the cardiologist to revisit whether she needs prolonged cardiac monitoring or anticoagulation.  She was advised not to drive for 6 months since her episode of loss of consciousness and return for follow-up to see me in 6 months or call earlier if necessary.  Greater than 50% time during this 25-minute  visit was spent on counseling and  coordination of care about episode of convulsive syncope and answering questions Delia Heady, MD  [facility] [address] [place], [place] [address]  Phone [phone] Fax [phone] Note: This document was prepared with digital dictation and possible smart phrase technology. Any transcriptional errors that result from this process are unintentional.

## 2021-03-05 NOTE — Patient Instructions (Signed)
I had a long discussion with the patient regarding her episode of brief loss of consciousness and discussed results of EEG and MRI scan and answered questions.  The exact etiology of the episodes remains unclear as to whether it was a convulsive syncope or unwitnessed seizure.  Since episode was unprovoked risk of recurrence is lifetime less than 50% hence I do not believe starting anticonvulsants is necessary.  Since she has a prior history of paroxysmal A. fib in December 2018 I recommend referral to the cardiologist to revisit whether she needs prolonged cardiac monitoring or anticoagulation.  She was advised not to drive for 6 months since her episode of loss of consciousness and return for follow-up to see me in 6 months or call earlier if necessary.

## 2021-03-18 ENCOUNTER — Ambulatory Visit: Payer: 59 | Admitting: Neurology

## 2021-04-06 DIAGNOSIS — K219 Gastro-esophageal reflux disease without esophagitis: Secondary | ICD-10-CM | POA: Insufficient documentation

## 2021-04-06 DIAGNOSIS — E7800 Pure hypercholesterolemia, unspecified: Secondary | ICD-10-CM | POA: Insufficient documentation

## 2021-04-06 DIAGNOSIS — O021 Missed abortion: Secondary | ICD-10-CM | POA: Insufficient documentation

## 2021-04-15 ENCOUNTER — Encounter: Payer: Self-pay | Admitting: Cardiology

## 2021-04-15 ENCOUNTER — Ambulatory Visit (INDEPENDENT_AMBULATORY_CARE_PROVIDER_SITE_OTHER): Payer: 59

## 2021-04-15 ENCOUNTER — Ambulatory Visit: Payer: 59 | Admitting: Cardiology

## 2021-04-15 ENCOUNTER — Other Ambulatory Visit: Payer: Self-pay

## 2021-04-15 VITALS — Ht 64.0 in | Wt 221.0 lb

## 2021-04-15 DIAGNOSIS — R55 Syncope and collapse: Secondary | ICD-10-CM | POA: Diagnosis not present

## 2021-04-15 DIAGNOSIS — R002 Palpitations: Secondary | ICD-10-CM | POA: Diagnosis not present

## 2021-04-15 DIAGNOSIS — R001 Bradycardia, unspecified: Secondary | ICD-10-CM

## 2021-04-15 NOTE — Patient Instructions (Signed)
Medication Instructions:  Your physician recommends that you continue on your current medications as directed. Please refer to the Current Medication list given to you today.  *If you need a refill on your cardiac medications before your next appointment, please call your pharmacy*   Lab Work: None If you have labs (blood work) drawn today and your tests are completely normal, you will receive your results only by: MyChart Message (if you have MyChart) OR A paper copy in the mail If you have any lab test that is abnormal or we need to change your treatment, we will call you to review the results.   Testing/Procedures: Your physician has requested that you have an echocardiogram. Echocardiography is a painless test that uses sound waves to create images of your heart. It provides your doctor with information about the size and shape of your heart and how well your heart's chambers and valves are working. This procedure takes approximately one hour. There are no restrictions for this procedure.  A zio monitor was ordered today. It will remain on for 14 days. You will then return monitor and event diary in provided box. It takes 1-2 weeks for report to be downloaded and returned to [place]. We will call you with the results. If monitor falls off or has orange flashing light, please call Zio for further instructions.   ZIO  WHY IS MY DOCTOR PRESCRIBING ZIO? The Zio system is proven and trusted by physicians to detect and diagnose irregular heart rhythms -- and has been prescribed to hundreds of thousands of patients.  The FDA has cleared the Zio system to monitor for many different kinds of irregular heart rhythms. In a study, physicians were able to reach a diagnosis 90% of the time with the Zio system1.  You can wear the Zio monitor -- a small, discreet, comfortable patch -- during your normal day-to-day activity, including while you sleep, shower, and exercise, while it records every single  heartbeat for analysis.  1Barrett, P., et al. Comparison of 24 Hour Holter Monitoring Versus 14 Day Novel Adhesive Patch Electrocardiographic Monitoring. American Journal of Medicine, 2014.  ZIO VS. HOLTER MONITORING The Zio monitor can be comfortably worn for up to 14 days. Holter monitors can be worn for 24 to 48 hours, limiting the time to record any irregular heart rhythms you may have. Zio is able to capture data for the 51% of patients who have their first symptom-triggered arrhythmia after 48 hours.1  LIVE WITHOUT RESTRICTIONS The Zio ambulatory cardiac monitor is a small, unobtrusive, and water-resistant patch--you might even forget you're wearing it. The Zio monitor records and stores every beat of your heart, whether you're sleeping, working out, or showering. Remove on: Sept 21st 2022   Follow-Up: At [facility], you and your health needs are our priority.  As part of our continuing mission to provide you with exceptional heart care, we have created designated Provider Care Teams.  These Care Teams include your primary Cardiologist (physician) and Advanced Practice Providers (APPs -  Physician Assistants and Nurse Practitioners) who all work together to provide you with the care you need, when you need it.  We recommend signing up for the patient portal called "MyChart".  Sign up information is provided on this After Visit Summary.  MyChart is used to connect with patients for Virtual Visits (Telemedicine).  Patients are able to view lab/test results, encounter notes, upcoming appointments, etc.  Non-urgent messages can be sent to your provider as well.   To  learn more about what you can do with MyChart, go to [email or web address].    Your next appointment:   4 month(s)  The format for your next appointment:   In Person  Provider:   Thomasene Ripple, DO [address], [place], [place] [address]    Other Instructions Echocardiogram An echocardiogram is a test that uses  sound waves (ultrasound) to produce images of the heart. Images from an echocardiogram can provide important information about: Heart size and shape. The size and thickness and movement of your heart's walls. Heart muscle function and strength. Heart valve function or if you have stenosis. Stenosis is when the heart valves are too narrow. If blood is flowing backward through the heart valves (regurgitation). A tumor or infectious growth around the heart valves. Areas of heart muscle that are not working well because of poor blood flow or injury from a heart attack. Aneurysm detection. An aneurysm is a weak or damaged part of an artery wall. The wall bulges out from the normal force of blood pumping through the body. Tell a health care provider about: Any allergies you have. All medicines you are taking, including vitamins, herbs, eye drops, creams, and over-the-counter medicines. Any blood disorders you have. Any surgeries you have had. Any medical conditions you have. Whether you are pregnant or may be pregnant. What are the risks? Generally, this is a safe test. However, problems may occur, including an allergic reaction to dye (contrast) that may be used during the test. What happens before the test? No specific preparation is needed. You may eat and drink normally. What happens during the test?  You will take off your clothes from the waist up and put on a hospital gown. Electrodes or electrocardiogram (ECG)patches may be placed on your chest. The electrodes or patches are then connected to a device that monitors your heart rate and rhythm. You will lie down on a table for an ultrasound exam. A gel will be applied to your chest to help sound waves pass through your skin. A handheld device, called a transducer, will be pressed against your chest and moved over your heart. The transducer produces sound waves that travel to your heart and bounce back (or "echo" back) to the transducer.  These sound waves will be captured in real-time and changed into images of your heart that can be viewed on a video monitor. The images will be recorded on a computer and reviewed by your health care provider. You may be asked to change positions or hold your breath for a short time. This makes it easier to get different views or better views of your heart. In some cases, you may receive contrast through an IV in one of your veins. This can improve the quality of the pictures from your heart. The procedure may vary among health care providers and hospitals. What can I expect after the test? You may return to your normal, everyday life, including diet, activities, and medicines, unless your health care provider tells you not to do that. Follow these instructions at home: It is up to you to get the results of your test. Ask your health care provider, or the department that is doing the test, when your results will be ready. Keep all follow-up visits. This is important. Summary An echocardiogram is a test that uses sound waves (ultrasound) to produce images of the heart. Images from an echocardiogram can provide important information about the size and shape of your heart, heart muscle function, heart  valve function, and other possible heart problems. You do not need to do anything to prepare before this test. You may eat and drink normally. After the echocardiogram is completed, you may return to your normal, everyday life, unless your health care provider tells you not to do that. This information is not intended to replace advice given to you by your health care provider. Make sure you discuss any questions you have with your health care provider. Document Revised: 03/18/2020 Document Reviewed: 03/18/2020 Elsevier Patient Education  2022 [employer].

## 2021-04-16 NOTE — Progress Notes (Signed)
Cardiology Office Note:    Date:  04/16/2021   ID:  Martha Washington, DOB 28-Jan-1966, MRN [id number]  PCP:  Linus MakoIzaj, Julie, PA-C  Cardiologist:  Thomasene RippleKardie Sharla Tankard, DO  Electrophysiologist:  None   Referring MD: Micki RileySethi, Pramod S, MD   I had a syncope episode  History of Present Illness:    Martha Washington is a 55 y.o. female with a hx of postop atrial fibrillation 5 years ago, hyperlipidemia is here today to be evaluated for syncope.  The patient tells me that back in November 29, 2020 she was experiencing significant nausea after she left a cookout.  He tells me that when she got home she felt sick to her stomach she went to the commode to urinate at the same time she started vomiting.  During that time she passed out.  She is not sure how long she was down for but she is thinks that a most the pain more than 10 minutes.  After she woke up she realized that she had vomited again during the time she had passed out.  She notes that during that episode she also had bitten her tongue.  After this episode she saw her PCP who referred her to neurology as high suspicion for seizure.  She had MRI and Eeg work-up which showed that everything was normal.  With this neurology recommended that she see cardiology. He has not had any syncope episodes since her event in April.  No other complaints at this time.  Past Medical History:  Diagnosis Date   Atrial fibrillation with RVR (HCC)    GERD (gastroesophageal reflux disease)    no meds   High cholesterol    Menorrhagia 07/13/2013   Missed abortion    resolved - no surgery required   New onset atrial fibrillation (HCC) 08/04/2017   with RVR to the 160s; S/P hammertoe OR/notes 08/04/2017   S/P bunionectomy 08/04/2017   SVD (spontaneous vaginal delivery) X 2   Tachycardia 08/04/2017    Past Surgical History:  Procedure Laterality Date   ABDOMINAL HYSTERECTOMY     BACK SURGERY     BUNIONECTOMY WITH HAMMERTOE RECONSTRUCTION Right 03/2017   BUNIONECTOMY  WITH HAMMERTOE RECONSTRUCTION Left 08/04/2017   BUNIONECTOMY WITH HAMMERTOE RECONSTRUCTION Left 08/04/2017   Procedure: Left modified Adin HectorMcBride and Lapidus bunion corrections; Left 2-3 Weil and hammertoe corrections;  Surgeon: Toni ArthursHewitt, John, MD;  Location: [facility];  Service: Orthopedics;  Laterality: Left;   ROBOTIC ASSISTED TOTAL HYSTERECTOMY WITH BILATERAL SALPINGO OOPHERECTOMY N/A 07/13/2013   Procedure: ROBOTIC ASSISTED TOTAL HYSTERECTOMY WITH BILATERAL SALPINGECTOMY;  Surgeon: Esmeralda ArthurSandra A Rivard, MD;  Location: [facility];  Service: Gynecology;  Laterality: N/A;   SHOULDER ARTHROSCOPY W/ ROTATOR CUFF REPAIR Right 2010   SHOULDER ARTHROSCOPY WITH ROTATOR CUFF REPAIR Right 10/17/2014   Procedure: SHOULDER ARTHROSCOPY WITH ROTATOR CUFF REPAIR;  Surgeon: Mckinley Jewelaniel Murphy, MD;  Location: [facility];  Service: Orthopedics;  Laterality: Right;   TUBAL LIGATION     WEIL OSTEOTOMY Left 08/04/2017   Procedure: Left 2nd and 3rd weil osteotomy;  Surgeon: Toni ArthursHewitt, John, MD;  Location:  SURGERY CENTER;  Service: Orthopedics;  Laterality: Left;   WISDOM TOOTH EXTRACTION      Current Medications: No outpatient medications have been marked as taking for the 04/15/21 encounter (Office Visit) with Thomasene Rippleobb, Laurin Paulo, DO.     Allergies:   Erythromycin, Azithromycin, Morphine and related, and Oxycodone   Social History   Socioeconomic History   Marital status: Married  Spouse name: Loraine Leriche   Number of children: Not on file   Years of education: Not on file   Highest education level: Not on file  Occupational History   Not on file  Tobacco Use   Smoking status: Never   Smokeless tobacco: Never  Vaping Use   Vaping Use: Never used  Substance and Sexual Activity   Alcohol use: No   Drug use: No   Sexual activity: Not on file  Other Topics Concern   Not on file  Social History Narrative   Lives with husband   Right Handed   Drinks 1 cup caffeine daily   Social Determinants  of Health   Financial Resource Strain: Not on file  Food Insecurity: Not on file  Transportation Needs: Not on file  Physical Activity: Not on file  Stress: Not on file  Social Connections: Not on file     Family History: The patient's family history includes Hypertension in her father and mother.  ROS:   Review of Systems  Constitution: Negative for decreased appetite, fever and weight gain.  HENT: Negative for congestion, ear discharge, hoarse voice and sore throat.   Eyes: Negative for discharge, redness, vision loss in right eye and visual halos.  Cardiovascular: Negative for chest pain, dyspnea on exertion, leg swelling, orthopnea and palpitations.  Respiratory: Negative for cough, hemoptysis, shortness of breath and snoring.   Endocrine: Negative for heat intolerance and polyphagia.  Hematologic/Lymphatic: Negative for bleeding problem. Does not bruise/bleed easily.  Skin: Negative for flushing, nail changes, rash and suspicious lesions.  Musculoskeletal: Negative for arthritis, joint pain, muscle cramps, myalgias, neck pain and stiffness.  Gastrointestinal: Negative for abdominal pain, bowel incontinence, diarrhea and excessive appetite.  Genitourinary: Negative for decreased libido, genital sores and incomplete emptying.  Neurological: Negative for brief paralysis, focal weakness, headaches and loss of balance.  Psychiatric/Behavioral: Negative for altered mental status, depression and suicidal ideas.  Allergic/Immunologic: Negative for HIV exposure and persistent infections.    EKGs/Labs/Other Studies Reviewed:    The following studies were reviewed today:   EKG:  The ekg ordered today demonstrates sinus bradycardia, heart rate 59 bpm  Recent Labs: No results found for requested labs within last 8760 hours.  Recent Lipid Panel No results found for: CHOL, TRIG, HDL, CHOLHDL, VLDL, LDLCALC, LDLDIRECT  Physical Exam:    VS:  Ht 5\' 4"  (1.626 m)   Wt 221 lb 0.6 oz  (100.3 kg)   LMP 07/05/2013   SpO2 93%   BMI 37.94 kg/m     Wt Readings from Last 3 Encounters:  04/15/21 221 lb 0.6 oz (100.3 kg)  03/05/21 218 lb 3.2 oz (99 kg)  12/08/20 223 lb (101.2 kg)     GEN: Well nourished, well developed in no acute distress HEENT: Normal NECK: No JVD; No carotid bruits LYMPHATICS: No lymphadenopathy CARDIAC: S1S2 noted,RRR, no murmurs, rubs, gallops RESPIRATORY:  Clear to auscultation without rales, wheezing or rhonchi  ABDOMEN: Soft, non-tender, non-distended, +bowel sounds, no guarding. EXTREMITIES: No edema, No cyanosis, no clubbing MUSCULOSKELETAL:  No deformity  SKIN: Warm and dry NEUROLOGIC:  Alert and oriented x 3, non-focal PSYCHIATRIC:  Normal affect, good insight  ASSESSMENT:    1. Syncope and collapse   2. Palpitations   3. Sinus bradycardia    PLAN:     1.  I would like to rule out a cardiovascular etiology of this syncope therefore at this time I would like to placed a zio patch for   14  days. In additon a transthoracic echocardiogram will be ordered to assess LV/RV function and any structural abnormalities. Once these testing have been performed amd reviewed further reccomendations will be made. For now, I do reccomend that the patient goes to the nearest ED if  symptoms recur.  The patient is in agreement with the above plan. The patient left the office in stable condition.  The patient will follow up in   Medication Adjustments/Labs and Tests Ordered: Current medicines are reviewed at length with the patient today.  Concerns regarding medicines are outlined above.  Orders Placed This Encounter  Procedures   LONG TERM MONITOR-LIVE TELEMETRY (3-14 DAYS)   EKG 12-Lead   ECHOCARDIOGRAM COMPLETE   No orders of the defined types were placed in this encounter.   Patient Instructions  Medication Instructions:  Your physician recommends that you continue on your current medications as directed. Please refer to the Current  Medication list given to you today.  *If you need a refill on your cardiac medications before your next appointment, please call your pharmacy*   Lab Work: None If you have labs (blood work) drawn today and your tests are completely normal, you will receive your results only by: MyChart Message (if you have MyChart) OR A paper copy in the mail If you have any lab test that is abnormal or we need to change your treatment, we will call you to review the results.   Testing/Procedures: Your physician has requested that you have an echocardiogram. Echocardiography is a painless test that uses sound waves to create images of your heart. It provides your doctor with information about the size and shape of your heart and how well your heart's chambers and valves are working. This procedure takes approximately one hour. There are no restrictions for this procedure.  A zio monitor was ordered today. It will remain on for 14 days. You will then return monitor and event diary in provided box. It takes 1-2 weeks for report to be downloaded and returned to [place]. We will call you with the results. If monitor falls off or has orange flashing light, please call Zio for further instructions.   ZIO  WHY IS MY DOCTOR PRESCRIBING ZIO? The Zio system is proven and trusted by physicians to detect and diagnose irregular heart rhythms -- and has been prescribed to hundreds of thousands of patients.  The FDA has cleared the Zio system to monitor for many different kinds of irregular heart rhythms. In a study, physicians were able to reach a diagnosis 90% of the time with the Zio system1.  You can wear the Zio monitor -- a small, discreet, comfortable patch -- during your normal day-to-day activity, including while you sleep, shower, and exercise, while it records every single heartbeat for analysis.  1Barrett, P., et al. Comparison of 24 Hour Holter Monitoring Versus 14 Day Novel Adhesive Patch Electrocardiographic  Monitoring. American Journal of Medicine, 2014.  ZIO VS. HOLTER MONITORING The Zio monitor can be comfortably worn for up to 14 days. Holter monitors can be worn for 24 to 48 hours, limiting the time to record any irregular heart rhythms you may have. Zio is able to capture data for the 51% of patients who have their first symptom-triggered arrhythmia after 48 hours.1  LIVE WITHOUT RESTRICTIONS The Zio ambulatory cardiac monitor is a small, unobtrusive, and water-resistant patch--you might even forget you're wearing it. The Zio monitor records and stores every beat of your heart, whether you're sleeping, working out, or showering.  Remove on: Sept 21st 2022   Follow-Up: At [facility], you and your health needs are our priority.  As part of our continuing mission to provide you with exceptional heart care, we have created designated Provider Care Teams.  These Care Teams include your primary Cardiologist (physician) and Advanced Practice Providers (APPs -  Physician Assistants and Nurse Practitioners) who all work together to provide you with the care you need, when you need it.  We recommend signing up for the patient portal called "MyChart".  Sign up information is provided on this After Visit Summary.  MyChart is used to connect with patients for Virtual Visits (Telemedicine).  Patients are able to view lab/test results, encounter notes, upcoming appointments, etc.  Non-urgent messages can be sent to your provider as well.   To learn more about what you can do with MyChart, go to [email or web address].    Your next appointment:   4 month(s)  The format for your next appointment:   In Person  Provider:   Thomasene Ripple, DO [address], [place], [place] [address]    Other Instructions Echocardiogram An echocardiogram is a test that uses sound waves (ultrasound) to produce images of the heart. Images from an echocardiogram can provide important information about: Heart size and  shape. The size and thickness and movement of your heart's walls. Heart muscle function and strength. Heart valve function or if you have stenosis. Stenosis is when the heart valves are too narrow. If blood is flowing backward through the heart valves (regurgitation). A tumor or infectious growth around the heart valves. Areas of heart muscle that are not working well because of poor blood flow or injury from a heart attack. Aneurysm detection. An aneurysm is a weak or damaged part of an artery wall. The wall bulges out from the normal force of blood pumping through the body. Tell a health care provider about: Any allergies you have. All medicines you are taking, including vitamins, herbs, eye drops, creams, and over-the-counter medicines. Any blood disorders you have. Any surgeries you have had. Any medical conditions you have. Whether you are pregnant or may be pregnant. What are the risks? Generally, this is a safe test. However, problems may occur, including an allergic reaction to dye (contrast) that may be used during the test. What happens before the test? No specific preparation is needed. You may eat and drink normally. What happens during the test?  You will take off your clothes from the waist up and put on a hospital gown. Electrodes or electrocardiogram (ECG)patches may be placed on your chest. The electrodes or patches are then connected to a device that monitors your heart rate and rhythm. You will lie down on a table for an ultrasound exam. A gel will be applied to your chest to help sound waves pass through your skin. A handheld device, called a transducer, will be pressed against your chest and moved over your heart. The transducer produces sound waves that travel to your heart and bounce back (or "echo" back) to the transducer. These sound waves will be captured in real-time and changed into images of your heart that can be viewed on a video monitor. The images will be  recorded on a computer and reviewed by your health care provider. You may be asked to change positions or hold your breath for a short time. This makes it easier to get different views or better views of your heart. In some cases, you may receive contrast through an  IV in one of your veins. This can improve the quality of the pictures from your heart. The procedure may vary among health care providers and hospitals. What can I expect after the test? You may return to your normal, everyday life, including diet, activities, and medicines, unless your health care provider tells you not to do that. Follow these instructions at home: It is up to you to get the results of your test. Ask your health care provider, or the department that is doing the test, when your results will be ready. Keep all follow-up visits. This is important. Summary An echocardiogram is a test that uses sound waves (ultrasound) to produce images of the heart. Images from an echocardiogram can provide important information about the size and shape of your heart, heart muscle function, heart valve function, and other possible heart problems. You do not need to do anything to prepare before this test. You may eat and drink normally. After the echocardiogram is completed, you may return to your normal, everyday life, unless your health care provider tells you not to do that. This information is not intended to replace advice given to you by your health care provider. Make sure you discuss any questions you have with your health care provider. Document Revised: 03/18/2020 Document Reviewed: 03/18/2020 Elsevier Patient Education  2022 Elsevier Inc.     Adopting a Healthy Lifestyle.  Know what a healthy weight is for you (roughly BMI <25) and aim to maintain this   Aim for 7+ servings of fruits and vegetables daily   65-80+ fluid ounces of water or unsweet tea for healthy kidneys   Limit to max 1 drink of alcohol per day; avoid  smoking/tobacco   Limit animal fats in diet for cholesterol and heart health - choose grass fed whenever available   Avoid highly processed foods, and foods high in saturated/trans fats   Aim for low stress - take time to unwind and care for your mental health   Aim for 150 min of moderate intensity exercise weekly for heart health, and weights twice weekly for bone health   Aim for 7-9 hours of sleep daily   When it comes to diets, agreement about the perfect plan isnt easy to find, even among the experts. Experts at the [facility] [employer] developed an idea known as the Healthy Eating Plate. Just imagine a plate divided into logical, healthy portions.   The emphasis is on diet quality:   Load up on vegetables and fruits - one-half of your plate: Aim for color and variety, and remember that potatoes dont count.   Go for whole grains - one-quarter of your plate: Whole wheat, barley, wheat berries, quinoa, oats, brown rice, and foods made with them. If you want pasta, go with whole wheat pasta.   Protein power - one-quarter of your plate: Fish, chicken, beans, and nuts are all healthy, versatile protein sources. Limit red meat.   The diet, however, does go beyond the plate, offering a few other suggestions.   Use healthy plant oils, such as olive, canola, soy, corn, sunflower and peanut. Check the labels, and avoid partially hydrogenated oil, which have unhealthy trans fats.   If youre thirsty, drink water. Coffee and tea are good in moderation, but skip sugary drinks and limit milk and dairy products to one or two daily servings.   The type of carbohydrate in the diet is more important than the amount. Some sources of carbohydrates, such as vegetables, fruits,  whole grains, and beans-are healthier than others.   Finally, stay active  Signed, Thomasene Ripple, DO  04/16/2021 1:12 PM    [facility]

## 2021-04-29 ENCOUNTER — Ambulatory Visit (HOSPITAL_BASED_OUTPATIENT_CLINIC_OR_DEPARTMENT_OTHER)
Admission: RE | Admit: 2021-04-29 | Discharge: 2021-04-29 | Disposition: A | Discharge status: Home / Self Care | Payer: 59 | Source: Ambulatory Visit | Attending: Cardiology | Admitting: Cardiology

## 2021-04-29 ENCOUNTER — Other Ambulatory Visit: Payer: Self-pay

## 2021-04-29 DIAGNOSIS — R55 Syncope and collapse: Secondary | ICD-10-CM

## 2021-04-30 LAB — ECHOCARDIOGRAM COMPLETE
AR max vel: 2.51 cm2
AV Area VTI: 2.38 cm2
AV Area mean vel: 2.5 cm2
AV Mean grad: 3 mmHg
AV Peak grad: 5.8 mmHg
Ao pk vel: 1.2 m/s
Area-P 1/2: 3.74 cm2
Calc EF: 59.3 %
S' Lateral: 2.96 cm
Single Plane A2C EF: 63.3 %
Single Plane A4C EF: 59.2 %

## 2021-05-01 ENCOUNTER — Telehealth: Payer: Self-pay | Admitting: Cardiology

## 2021-05-01 NOTE — Telephone Encounter (Signed)
Patient is returning call to discuss echo results. °

## 2021-05-04 NOTE — Telephone Encounter (Signed)
Spoke with patient, see chart.    

## 2021-08-25 ENCOUNTER — Ambulatory Visit: Payer: 59 | Admitting: Cardiology

## 2021-09-14 ENCOUNTER — Ambulatory Visit: Payer: 59 | Admitting: Neurology

## 2023-03-26 ENCOUNTER — Other Ambulatory Visit (HOSPITAL_BASED_OUTPATIENT_CLINIC_OR_DEPARTMENT_OTHER): Payer: Self-pay

## 2023-03-26 MED ORDER — ZEPBOUND 2.5 MG/0.5ML ~~LOC~~ SOAJ
2.5000 mg | SUBCUTANEOUS | 0 refills | Status: DC
  Filled 2023-03-26: qty 2, 28d supply, fill #0

## 2023-03-28 ENCOUNTER — Other Ambulatory Visit (HOSPITAL_BASED_OUTPATIENT_CLINIC_OR_DEPARTMENT_OTHER): Payer: Self-pay

## 2023-04-08 ENCOUNTER — Other Ambulatory Visit (HOSPITAL_BASED_OUTPATIENT_CLINIC_OR_DEPARTMENT_OTHER): Payer: Self-pay

## 2023-04-08 MED ORDER — ZEPBOUND 5 MG/0.5ML ~~LOC~~ SOAJ
5.0000 mg | SUBCUTANEOUS | 0 refills | Status: DC
  Filled 2023-04-08 – 2023-04-22 (×2): qty 2, 28d supply, fill #0

## 2023-04-14 ENCOUNTER — Other Ambulatory Visit (HOSPITAL_BASED_OUTPATIENT_CLINIC_OR_DEPARTMENT_OTHER): Payer: Self-pay

## 2023-04-22 ENCOUNTER — Other Ambulatory Visit (HOSPITAL_BASED_OUTPATIENT_CLINIC_OR_DEPARTMENT_OTHER): Payer: Self-pay

## 2023-04-28 ENCOUNTER — Other Ambulatory Visit (HOSPITAL_BASED_OUTPATIENT_CLINIC_OR_DEPARTMENT_OTHER): Payer: Self-pay

## 2023-04-28 MED ORDER — ESCITALOPRAM OXALATE 10 MG PO TABS
10.0000 mg | ORAL_TABLET | Freq: Every day | ORAL | 3 refills | Status: DC
  Filled 2023-04-28: qty 30, 30d supply, fill #0
  Filled 2023-05-13 – 2023-05-23 (×2): qty 30, 30d supply, fill #1
  Filled 2023-06-22: qty 30, 30d supply, fill #2
  Filled 2023-07-22: qty 30, 30d supply, fill #3

## 2023-05-11 ENCOUNTER — Other Ambulatory Visit (HOSPITAL_BASED_OUTPATIENT_CLINIC_OR_DEPARTMENT_OTHER): Payer: Self-pay

## 2023-05-11 MED ORDER — ZEPBOUND 5 MG/0.5ML ~~LOC~~ SOAJ
5.0000 mg | SUBCUTANEOUS | 0 refills | Status: DC
  Filled 2023-05-11 – 2023-05-21 (×2): qty 2, 28d supply, fill #0

## 2023-05-13 ENCOUNTER — Other Ambulatory Visit (HOSPITAL_BASED_OUTPATIENT_CLINIC_OR_DEPARTMENT_OTHER): Payer: Self-pay

## 2023-05-23 ENCOUNTER — Other Ambulatory Visit (HOSPITAL_BASED_OUTPATIENT_CLINIC_OR_DEPARTMENT_OTHER): Payer: Self-pay

## 2023-06-22 ENCOUNTER — Other Ambulatory Visit (HOSPITAL_BASED_OUTPATIENT_CLINIC_OR_DEPARTMENT_OTHER): Payer: Self-pay

## 2023-06-22 ENCOUNTER — Other Ambulatory Visit: Payer: Self-pay

## 2023-06-22 MED ORDER — ZEPBOUND 5 MG/0.5ML ~~LOC~~ SOAJ
5.0000 mg | SUBCUTANEOUS | 0 refills | Status: DC
  Filled 2023-06-22 (×2): qty 2, 28d supply, fill #0

## 2023-07-27 ENCOUNTER — Other Ambulatory Visit (HOSPITAL_BASED_OUTPATIENT_CLINIC_OR_DEPARTMENT_OTHER): Payer: Self-pay | Admitting: Internal Medicine

## 2023-07-27 DIAGNOSIS — E782 Mixed hyperlipidemia: Secondary | ICD-10-CM

## 2023-07-28 ENCOUNTER — Telehealth (HOSPITAL_BASED_OUTPATIENT_CLINIC_OR_DEPARTMENT_OTHER): Payer: Self-pay | Admitting: Internal Medicine

## 2023-08-04 ENCOUNTER — Other Ambulatory Visit (HOSPITAL_BASED_OUTPATIENT_CLINIC_OR_DEPARTMENT_OTHER): Payer: Self-pay | Admitting: Family Medicine

## 2023-08-04 ENCOUNTER — Ambulatory Visit (HOSPITAL_BASED_OUTPATIENT_CLINIC_OR_DEPARTMENT_OTHER)
Admission: RE | Admit: 2023-08-04 | Discharge: 2023-08-04 | Disposition: A | Discharge status: Home / Self Care | Payer: Self-pay | Source: Ambulatory Visit | Attending: Internal Medicine | Admitting: Internal Medicine

## 2023-08-04 ENCOUNTER — Ambulatory Visit (HOSPITAL_BASED_OUTPATIENT_CLINIC_OR_DEPARTMENT_OTHER)
Admission: RE | Admit: 2023-08-04 | Discharge: 2023-08-04 | Disposition: A | Discharge status: Home / Self Care | Payer: 59 | Source: Ambulatory Visit | Attending: Family Medicine | Admitting: Family Medicine

## 2023-08-04 ENCOUNTER — Encounter (HOSPITAL_BASED_OUTPATIENT_CLINIC_OR_DEPARTMENT_OTHER): Payer: Self-pay

## 2023-08-04 DIAGNOSIS — Z139 Encounter for screening, unspecified: Secondary | ICD-10-CM

## 2023-08-04 DIAGNOSIS — E782 Mixed hyperlipidemia: Secondary | ICD-10-CM | POA: Insufficient documentation

## 2023-08-11 ENCOUNTER — Other Ambulatory Visit: Payer: Self-pay | Admitting: Family Medicine

## 2023-08-11 DIAGNOSIS — R928 Other abnormal and inconclusive findings on diagnostic imaging of breast: Secondary | ICD-10-CM

## 2023-08-16 ENCOUNTER — Ambulatory Visit
Admission: RE | Admit: 2023-08-16 | Discharge: 2023-08-16 | Disposition: A | Discharge status: Home / Self Care | Payer: 59 | Source: Ambulatory Visit | Attending: Family Medicine | Admitting: Family Medicine

## 2023-08-16 ENCOUNTER — Other Ambulatory Visit: Payer: Self-pay | Admitting: Family Medicine

## 2023-08-16 DIAGNOSIS — R921 Mammographic calcification found on diagnostic imaging of breast: Secondary | ICD-10-CM

## 2023-08-16 DIAGNOSIS — N6320 Unspecified lump in the left breast, unspecified quadrant: Secondary | ICD-10-CM

## 2023-08-16 DIAGNOSIS — R928 Other abnormal and inconclusive findings on diagnostic imaging of breast: Secondary | ICD-10-CM

## 2023-08-16 HISTORY — PX: BREAST BIOPSY: SHX20

## 2023-08-17 ENCOUNTER — Telehealth: Payer: Self-pay | Admitting: *Deleted

## 2023-08-17 LAB — SURGICAL PATHOLOGY

## 2023-08-17 NOTE — Telephone Encounter (Signed)
 Spoke to patient to confirm upcoming afternoon [facility] clinic appointment on 11/5, paperwork will be sent via email  Gave location and time, also informed patient that the surgeon's office would be calling as well to get information from them similar to the packet that they will be receiving so make sure to do both.  Reminded patient that all providers will be coming to the clinic to see them HERE and if they had any questions to not hesitate to reach back out to myself or their navigators.

## 2023-08-21 ENCOUNTER — Other Ambulatory Visit (HOSPITAL_BASED_OUTPATIENT_CLINIC_OR_DEPARTMENT_OTHER): Payer: Self-pay

## 2023-08-22 ENCOUNTER — Other Ambulatory Visit (HOSPITAL_BASED_OUTPATIENT_CLINIC_OR_DEPARTMENT_OTHER): Payer: Self-pay

## 2023-08-22 ENCOUNTER — Encounter: Payer: Self-pay | Admitting: *Deleted

## 2023-08-22 DIAGNOSIS — C50412 Malignant neoplasm of upper-outer quadrant of left female breast: Secondary | ICD-10-CM | POA: Insufficient documentation

## 2023-08-22 MED ORDER — ESCITALOPRAM OXALATE 10 MG PO TABS
10.0000 mg | ORAL_TABLET | Freq: Every day | ORAL | 0 refills | Status: AC
  Filled 2023-08-23: qty 30, 30d supply, fill #0
  Filled 2023-09-28: qty 30, 30d supply, fill #1

## 2023-08-23 ENCOUNTER — Other Ambulatory Visit (HOSPITAL_BASED_OUTPATIENT_CLINIC_OR_DEPARTMENT_OTHER): Payer: Self-pay

## 2023-08-23 ENCOUNTER — Ambulatory Visit: Payer: Self-pay | Admitting: Surgery

## 2023-08-23 NOTE — Progress Notes (Signed)
 Radiation Oncology         ([phone]) [phone] ________________________________  Initial Outpatient Consultation  Name: Martha Washington MRN: [id number]  Date: 08/24/2023  DOB: Sep 08, 1965  WU:[id number], Vela Gerhard, MD   REFERRING PHYSICIAN: Dareen Ebbing, MD  DIAGNOSIS:    ICD-10-CM   1. Malignant neoplasm of upper-outer quadrant of left breast in female, estrogen receptor positive (HCC)  C50.412    Z17.0        Cancer Staging  Malignant neoplasm of upper-outer quadrant of left breast in female, estrogen receptor positive (HCC) Staging form: Breast, AJCC 8th Edition - Clinical stage from 08/24/2023: Stage IB (cT2, cN0, cM0, G2, ER+, PR+, HER2+) - Signed by Murleen Arms, MD on 08/24/2023 Stage prefix: Initial diagnosis Histologic grading system: 3 grade system Laterality: Left Staged by: Pathologist and managing physician Stage used in treatment planning: Yes National guidelines used in treatment planning: Yes Type of national guideline used in treatment planning: NCCN   Stage IB Left Breast UOQ, Invasive Ductal Carcinoma, ER+ / PR+ / Her2+, Grade 2  CHIEF COMPLAINT: Here to discuss management of left breast cancer  HISTORY OF PRESENT ILLNESS::Martha Washington is a 58 y.o. female who presented with a left breast abnormality on the following imaging: bilateral screening mammogram on the date of 08/04/23. No symptoms, if any, were reported at that time. Left breast diagnostic mammogram and left breast ultrasound performed on 08/16/23 further demonstrated: a highly suspicious mass in the 2 o'clock left breast, located 9 cmfn, measuring 2.1 cm, with associated suspicious calcifications spanning approximately 3.2 cm, as well as a satellite mass in the 2 o'clock left breast located 7 cmfn, measuring 0.4 cm in the greatest extent. No evidence of left axillary lymphadenopathy was demonstrated.     Biopsy of the 2 o'clock left breast mass located 9 cmfn on date of 08/16/23  showed grade 2 invasive ductal carcinoma measuring 9 mm in the greatest linear extent of the sample, with calcifications and areas of necrosis also present; negative for LVI.  ER status: 80% positive and PR status 20% positive, both with moderate to strong staining intensity; Proliferation marker Ki67 at 25%; Her2 status positive; Grade 2.  She is in her usual state of health and accompanied by her supportive significant other.  PREVIOUS RADIATION THERAPY: No  PAST MEDICAL HISTORY:  has a past medical history of Atrial fibrillation with RVR (HCC), GERD (gastroesophageal reflux disease), High cholesterol, Menorrhagia (07/13/2013), Missed abortion, New onset atrial fibrillation (HCC) (08/04/2017), S/P bunionectomy (08/04/2017), SVD (spontaneous vaginal delivery) (X 2), and Tachycardia (08/04/2017).    PAST SURGICAL HISTORY: Past Surgical History:  Procedure Laterality Date   ABDOMINAL HYSTERECTOMY     BACK SURGERY     BREAST BIOPSY Left 08/16/2023   US  LT BREAST BX W LOC DEV 1ST LESION IMG BX SPEC US  GUIDE 08/16/2023 [facility]MO[facility]   BUNIONECTOMY WITH HAMMERTOE RECONSTRUCTION Right 03/2017   BUNIONECTOMY WITH HAMMERTOE RECONSTRUCTION Left 08/04/2017   BUNIONECTOMY WITH HAMMERTOE RECONSTRUCTION Left 08/04/2017   Procedure: Left modified McBride and Lapidus bunion corrections; Left 2-3 Weil and hammertoe corrections;  Surgeon: Amada Backer, MD;  Location: [facility];  Service: Orthopedics;  Laterality: Left;   ROBOTIC ASSISTED TOTAL HYSTERECTOMY WITH BILATERAL SALPINGO OOPHERECTOMY N/A 07/13/2013   Procedure: ROBOTIC ASSISTED TOTAL HYSTERECTOMY WITH BILATERAL SALPINGECTOMY;  Surgeon: Mckinley Spells, MD;  Location: [facility];  Service: Gynecology;  Laterality: N/A;   SHOULDER ARTHROSCOPY W/ ROTATOR CUFF REPAIR Right 2010   SHOULDER ARTHROSCOPY  WITH ROTATOR CUFF REPAIR Right 10/17/2014   Procedure: SHOULDER ARTHROSCOPY WITH ROTATOR CUFF REPAIR;  Surgeon: Sandie Cross, MD;  Location: [facility];  Service: Orthopedics;  Laterality: Right;   TUBAL LIGATION     WEIL OSTEOTOMY Left 08/04/2017   Procedure: Left 2nd and 3rd weil osteotomy;  Surgeon: Amada Backer, MD;  Location:  SUR[facility]ERY [facility];  Service: Orthopedics;  Laterality: Left;   WISDOM TOOTH EXTRACTION      FAMILY HISTORY: family history includes Bone cancer in her brother; Hypertension in her father and mother.  SOCIAL HISTORY:  reports that she has never smoked. She has never used smokeless tobacco. She reports that she does not drink alcohol and does not use drugs.  ALLERGIES: Other, Azithromycin, Morphine and codeine, and Oxycodone   MEDICATIONS:  Current Outpatient Medications  Medication Sig Dispense Refill   Ascorbic Acid (VITAMIN C) 1000 MG tablet Take 1,000 mg by mouth daily.     b complex vitamins capsule Take 1 capsule by mouth daily.     escitalopram  (LEXAPRO ) 10 MG tablet Take 1 tablet (10 mg total) by mouth daily. 90 tablet 0   VITAMIN D PO Take by mouth.     ZINC CITRATE PO Take by mouth.     No current facility-administered medications for this encounter.    REVIEW OF SYSTEMS: As above in HPI.   PHYSICAL EXAM:  vitals were not taken for this visit.   General: Alert and oriented, in no acute distress HEENT: Head is normocephalic. Extraocular movements are intact.   Heart: Regular in rate and rhythm with no murmurs, rubs, or gallops. Chest: Clear to auscultation bilaterally, with no rhonchi, wheezes, or rales. Abdomen: Soft, nontender, nondistended, with no rigidity or guarding. Extremities: No cyanosis or edema. Lymphatics: No axillary adenopathy Skin: No concerning lesions. Musculoskeletal: symmetric strength and muscle tone throughout. Neurologic: Cranial nerves II through XII are grossly intact. No obvious focalities. Speech is fluent. Coordination is intact. Psychiatric: Judgment and insight are intact. Affect is appropriate. Breasts: Upper outer quadrant left breast  mass, approximately 3 cm. No other palpable masses appreciated in the breasts or axillae bilaterally.   ECOG = 0  0 - Asymptomatic (Fully active, able to carry on all predisease activities without restriction)  1 - Symptomatic but completely ambulatory (Restricted in physically strenuous activity but ambulatory and able to carry out work of a light or sedentary nature. For example, light housework, office work)  2 - Symptomatic, <50% in bed during the day (Ambulatory and capable of all self care but unable to carry out any work activities. Up and about more than 50% of waking hours)  3 - Symptomatic, >50% in bed, but not bedbound (Capable of only limited self-care, confined to bed or chair 50% or more of waking hours)  4 - Bedbound (Completely disabled. Cannot carry on any self-care. Totally confined to bed or chair)  5 - Death   Aurea Blossom MM, Creech RH, Tormey DC, et al. [phone]). "Toxicity and response criteria of the [facility]". Am. Hillard Lowes. Oncol. 5 (6): 649-55   LABORATORY DATA:  Lab Results  Component Value Date   WBC 6.1 08/24/2023   HGB 13.5 08/24/2023   HCT 41.3 08/24/2023   MCV 84.1 08/24/2023   PLT 244 08/24/2023   CMP     Component Value Date/Time   NA 140 08/24/2023 1157   K 4.0 08/24/2023 1157   CL 104 08/24/2023 1157   CO2 30 08/24/2023 1157  GLUCOSE 85 08/24/2023 1157   BUN 14 08/24/2023 1157   CREATININE 0.69 08/24/2023 1157   CALCIUM 9.5 08/24/2023 1157   PROT 6.8 08/24/2023 1157   ALBUMIN 4.0 08/24/2023 1157   AST 21 08/24/2023 1157   ALT 19 08/24/2023 1157   ALKPHOS 58 08/24/2023 1157   BILITOT 0.7 08/24/2023 1157   GFRNONAA >60 08/24/2023 1157   GFRAA >60 08/24/2018 0837         RADIOGRAPHY: US  LT BREAST BX W LOC DEV 1ST LESION IMG BX SPEC US  GUIDE Addendum Date: 08/22/2023 ADDENDUM REPORT: 08/22/2023 16:11 ADDENDUM: Pathology revealed GRADE II-III INVASIVE DUCTAL CARCINOMA, CALCIFICATIONS: PRESENT, OTHER FINDINGS: AREAS OF  NECROSIS ARE PRESENT of the LEFT breast, 2 o'clock, 9cmfn, (heart clip). This was found to be concordant by Dr. Allena Ito. Pathology results were discussed with the patient by telephone by Ladonna Pickup, RN Nurse Navigator. The patient reported doing well after the biopsy with tenderness at the site. Post biopsy instructions and care were reviewed and questions were answered. The patient was encouraged to call [facility] for any additional concerns. The patient was referred to [facility] on August 24, 2023. Recommendation for BILATERAL breast MRI without and with contrast to confirm extent of disease, to be sure there is no non-calcified DCIS elsewhere and to assess the possibly small satellite nodule at 2 o'clock, 7 cm from the nipple. Pathology results reported by Kraig Peru, RN on 08/18/2023. Electronically Signed   By: Allena Ito M.D.   On: 08/22/2023 16:11   Result Date: 08/22/2023 CLINICAL DATA:  58 year old female presenting for biopsy of a mass calcifications in the left breast. EXAM: ULTRASOUND GUIDED LEFT BREAST CORE NEEDLE BIOPSY COMPARISON:  Previous exam(s). PROCEDURE: I met with the patient and we discussed the procedure of ultrasound-guided biopsy, including benefits and alternatives. We discussed the high likelihood of a successful procedure. We discussed the risks of the procedure, including infection, bleeding, tissue injury, clip migration, and inadequate sampling. Informed written consent was given. The usual time-out protocol was performed immediately prior to the procedure. Lesion quadrant: Upper outer quadrant Using sterile technique and 1% Lidocaine  as local anesthetic, under direct ultrasound visualization, a 14 gauge spring-loaded device was used to perform biopsy of a mass in the left breast at 2 o'clock using a lateral approach. At the conclusion of the procedure a heart shaped tissue  marker clip was deployed into the biopsy cavity. Follow up 2 view mammogram was performed and dictated separately. IMPRESSION: Ultrasound guided biopsy of a mass in the left breast at 2 o'clock. No apparent complications. Electronically Signed: By: Allena Ito M.D. On: 08/16/2023 15:48   MM CLIP PLACEMENT LEFT Result Date: 08/16/2023 CLINICAL DATA:  Post procedure mammogram for clip placement EXAM: 3D DIAGNOSTIC LEFT MAMMOGRAM POST ULTRASOUND BIOPSY COMPARISON:  Previous exam(s). FINDINGS: 3D Mammographic images were obtained following ultrasound guided biopsy of a mass with calcifications in the left breast at 2 o'clock. The heart biopsy marking clip is in expected position at the site of biopsy. IMPRESSION: Appropriate positioning of the heart biopsy marking clip at the site of biopsy in the left breast at 2 o'clock. Final Assessment: Post Procedure Mammograms for Marker Placement Electronically Signed   By: Allena Ito M.D.   On: 08/16/2023 15:47   MM Digital Diagnostic Unilat L Result Date: 08/16/2023 CLINICAL DATA:  Recall from screening mammography, new calcifications involving the UPPER OUTER QUADRANT of the LEFT  breast at posterior depth. EXAM: DIGITAL DIAGNOSTIC UNILATERAL LEFT MAMMOGRAM WITH CAD; ULTRASOUND LEFT BREAST LIMITED TECHNIQUE: Left digital diagnostic mammography was performed. ; Targeted ultrasound examination of the left breast was performed. COMPARISON:  Previous exam(s). ACR Breast Density Category b: There are scattered areas of fibroglandular density. FINDINGS: Spot magnification CC and MLO views of the calcifications and a full field mediolateral view were obtained. There is a group of coarse heterogeneous calcifications associated with an asymmetry in the UPPER OUTER QUADRANT at posterior depth spanning approximately 3.2 x 2.2 x 2.9 cm (AP x transverse x CC). There are linear and branching forms within the large group. There is a possible mass at the posterior margin of  the calcifications measuring just under 1 cm in size. Targeted ultrasound is performed, demonstrating an irregular hypoechoic mass with angular margins and associated calcifications at 2 o'clock 9 cm from the nipple measuring approximately 2.1 x 1.6 x 1.6 cm, demonstrating posterior acoustic shadowing and demonstrating internal power Doppler flow. There is a 0.4 x 0.3 x 0.2 cm satellite mass at 2 o'clock 7 cm from the nipple which is approximately 2 cm removed from the dominant mass. Sonographic evaluation of the LEFT axilla demonstrates no pathologic lymphadenopathy. IMPRESSION: 1. Highly suspicious 2.1 cm mass in the UPPER OUTER QUADRANT of the LEFT breast at 2 o'clock 9 cm from the nipple with associated highly suspicious calcifications which span 3.2 cm. 2. Satellite 0.4 cm mass at 2 o'clock 7 cm from the nipple. 3. No pathologic LEFT axillary lymphadenopathy. RECOMMENDATION: 1. Ultrasound-guided core needle biopsy of the highly suspicious LEFT breast mass at 2 o'clock 9 cm from the nipple and the satellite mass at 2 o'clock 7 cm from the nipple. 2. Consider Breast MRI without and with contrast to confirm extent of disease and be sure that there is no noncalcified DCIS elsewhere. I have discussed the findings and recommendations with the patient. The ultrasound core needle biopsy procedure was discussed with the patient and her questions were answered. She wishes to proceed with the biopsy which has been scheduled by the [facility] staff. BI-RADS CATEGORY  5: Highly suggestive of malignancy. Electronically Signed   By: Rinda Cheers M.D.   On: 08/16/2023 10:53   US  LIMITED ULTRASOUND INCLUDING AXILLA LEFT BREAST  Result Date: 08/16/2023 CLINICAL DATA:  Recall from screening mammography, new calcifications involving the UPPER OUTER QUADRANT of the LEFT breast at posterior depth. EXAM: DIGITAL DIAGNOSTIC UNILATERAL LEFT MAMMOGRAM WITH CAD; ULTRASOUND LEFT BREAST LIMITED TECHNIQUE:  Left digital diagnostic mammography was performed. ; Targeted ultrasound examination of the left breast was performed. COMPARISON:  Previous exam(s). ACR Breast Density Category b: There are scattered areas of fibroglandular density. FINDINGS: Spot magnification CC and MLO views of the calcifications and a full field mediolateral view were obtained. There is a group of coarse heterogeneous calcifications associated with an asymmetry in the UPPER OUTER QUADRANT at posterior depth spanning approximately 3.2 x 2.2 x 2.9 cm (AP x transverse x CC). There are linear and branching forms within the large group. There is a possible mass at the posterior margin of the calcifications measuring just under 1 cm in size. Targeted ultrasound is performed, demonstrating an irregular hypoechoic mass with angular margins and associated calcifications at 2 o'clock 9 cm from the nipple measuring approximately 2.1 x 1.6 x 1.6 cm, demonstrating posterior acoustic shadowing and demonstrating internal power Doppler flow. There is a 0.4 x 0.3 x 0.2 cm satellite mass at 2 o'clock  7 cm from the nipple which is approximately 2 cm removed from the dominant mass. Sonographic evaluation of the LEFT axilla demonstrates no pathologic lymphadenopathy. IMPRESSION: 1. Highly suspicious 2.1 cm mass in the UPPER OUTER QUADRANT of the LEFT breast at 2 o'clock 9 cm from the nipple with associated highly suspicious calcifications which span 3.2 cm. 2. Satellite 0.4 cm mass at 2 o'clock 7 cm from the nipple. 3. No pathologic LEFT axillary lymphadenopathy. RECOMMENDATION: 1. Ultrasound-guided core needle biopsy of the highly suspicious LEFT breast mass at 2 o'clock 9 cm from the nipple and the satellite mass at 2 o'clock 7 cm from the nipple. 2. Consider Breast MRI without and with contrast to confirm extent of disease and be sure that there is no noncalcified DCIS elsewhere. I have discussed the findings and recommendations with the patient. The ultrasound  core needle biopsy procedure was discussed with the patient and her questions were answered. She wishes to proceed with the biopsy which has been scheduled by the [facility] staff. BI-RADS CATEGORY  5: Highly suggestive of malignancy. Electronically Signed   By: Rinda Cheers M.D.   On: 08/16/2023 10:53   CT CARDIAC SCORING (SELF PAY ONLY) Addendum Date: 08/14/2023 ADDENDUM REPORT: 08/14/2023 08:49 EXAM: OVER-READ INTERPRETATION CT CHEST The following report is an over-read performed by radiologist Dr. Tita Form of [facility] on 08/14/2023. This over-read does not include interpretation of cardiac or coronary anatomy or pathology. The coronary calcium score interpretation by the cardiologist is attached. COMPARISON:  Esophagram dated 12/24/2020. FINDINGS: Cardiovascular: No significant extracardiac vascular findings. Normal heart size. No pericardial effusion. Mediastinum/Nodes: No enlarged mediastinal lymph nodes. The visible trachea and esophagus demonstrate no significant findings. Lungs/Pleura: The visible lungs are clear. No pleural effusion. Upper Abdomen: There is a moderate sized hiatal hernia, similar to prior exam. Musculoskeletal: Degenerative changes are seen in the spine. IMPRESSION: Moderate sized hiatal hernia. Electronically Signed   By: Tita Form M.D.   On: 08/14/2023 08:49   Result Date: 08/14/2023 : CLINICAL DATA:  Cardiovascular Disease Risk stratification EXAM: Coronary Calcium Score TECHNIQUE: A gated, non-contrast computed tomography scan of the heart was performed using 3mm slice thickness. Axial images were analyzed on a dedicated workstation. Calcium scoring of the coronary arteries was performed using the Agatston method. FINDINGS: Coronary Calcium Score: Left main: 0 Left anterior descending artery: 0 Left circumflex artery: 0 Right coronary artery: 0 Total: 0 Percentile: <1 Pericardium: Normal. Ascending Aorta: Normal caliber. Pulmonary  artery: Normal caliber Non-cardiac: See separate report from [facility]. IMPRESSION: Coronary calcium score of 0. This was <1 percentile for age-, race-, and sex-matched controls. RECOMMENDATIONS: Coronary artery calcium (CAC) score is a strong predictor of incident coronary heart disease (CHD) and provides predictive information beyond traditional risk factors. CAC scoring is reasonable to use in the decision to withhold, postpone, or initiate statin therapy in intermediate-risk or selected borderline-risk asymptomatic adults (age 84-75 years and LDL-C >=70 to <190 mg/dL) who do not have diabetes or established atherosclerotic cardiovascular disease (ASCVD).* In intermediate-risk (10-year ASCVD risk >=7.5% to <20%) adults or selected borderline-risk (10-year ASCVD risk >=5% to <7.5%) adults in whom a CAC score is measured for the purpose of making a treatment decision the following recommendations have been made: If CAC=0, it is reasonable to withhold statin therapy and reassess in 5 to 10 years, as long as higher risk conditions are absent (diabetes mellitus, family history of premature CHD in first degree relatives (males <55 years;  females <65 years), cigarette smoking, or LDL >=190 mg/dL). If CAC is 1 to 99, it is reasonable to initiate statin therapy for patients >=58 years of age. If CAC is >=100 or >=75th percentile, it is reasonable to initiate statin therapy at any age. Cardiology referral should be considered for patients with CAC scores >=400 or >=75th percentile. *2018 AHA/ACC/AACVPR/AAPA/ABC/ACPM/ADA/AGS/APhA/ASPC/NLA/PCNA Guideline on the Management of Blood Cholesterol: A Report of the American College of Cardiology/American Heart Association Task Force on Clinical Practice Guidelines. J Am Coll Cardiol. 2019;73(24):3168-3209. Electronically Signed: By: Ralene Burger M.D. On: 08/08/2023 08:48   MM 3D SCREENING MAMMOGRAM BILATERAL BREAST Result Date: 08/09/2023 CLINICAL DATA:   Screening. EXAM: DIGITAL SCREENING BILATERAL MAMMOGRAM WITH TOMOSYNTHESIS AND CAD TECHNIQUE: Bilateral screening digital craniocaudal and mediolateral oblique mammograms were obtained. Bilateral screening digital breast tomosynthesis was performed. The images were evaluated with computer-aided detection. COMPARISON:  Previous exam(s). ACR Breast Density Category b: There are scattered areas of fibroglandular density. FINDINGS: In the left breast, calcifications warrant further evaluation. In the right breast, no findings suspicious for malignancy. IMPRESSION: Further evaluation is suggested for calcifications in the left breast. RECOMMENDATION: Diagnostic mammogram of the left breast. (Code:[id number]) The patient will be contacted regarding the findings, and additional imaging will be scheduled. BI-RADS CATEGORY  0: Incomplete: Need additional imaging evaluation. Electronically Signed   By: Alger Infield M.D.   On: 08/09/2023 13:45      IMPRESSION/PLAN: Left breast cancer, triple positive, T2 N0 M0  She anticipates MRI of the breasts followed by neoadjuvant systemic therapy, with breast conserving surgery to follow.  Today we talked about adjuvant radiation therapy.  It was a pleasure meeting the patient today. We discussed the risks, benefits, and side effects of radiotherapy. I recommend radiotherapy to the left breast to reduce her risk of locoregional recurrence by 2/3.  We discussed that radiation would take approximately 4-6 weeks to complete (depending on lymph node status) and that I would give the patient a few weeks to heal following surgery before starting treatment planning. We spoke about acute effects including skin irritation and fatigue as well as much less common late effects including internal organ injury or irritation. We spoke about the latest technology that is used to minimize the risk of late effects for patients undergoing radiotherapy to the breast or chest wall. No guarantees of  treatment were given. The patient is enthusiastic about proceeding with treatment. I look forward to participating in the patient's care.  I will await her referral back to me for postoperative follow-up and eventual CT simulation/treatment planning.  On date of service, in total, I spent 35 minutes on this encounter. Patient was seen in person.   __________________________________________   Colie Dawes, MD  This document serves as a record of services personally performed by Colie Dawes, MD. It was created on her behalf by Aleta Anda, a trained medical scribe. The creation of this record is based on the scribe's personal observations and the provider's statements to them. This document has been checked and approved by the attending provider.

## 2023-08-24 ENCOUNTER — Inpatient Hospital Stay: Payer: 59

## 2023-08-24 ENCOUNTER — Encounter: Payer: Self-pay | Admitting: *Deleted

## 2023-08-24 ENCOUNTER — Other Ambulatory Visit: Payer: Self-pay | Admitting: Hematology and Oncology

## 2023-08-24 ENCOUNTER — Inpatient Hospital Stay (HOSPITAL_BASED_OUTPATIENT_CLINIC_OR_DEPARTMENT_OTHER): Payer: 59 | Admitting: Genetic Counselor

## 2023-08-24 ENCOUNTER — Encounter: Payer: Self-pay | Admitting: Radiation Oncology

## 2023-08-24 ENCOUNTER — Ambulatory Visit
Admission: RE | Admit: 2023-08-24 | Discharge: 2023-08-24 | Disposition: A | Discharge status: Home / Self Care | Payer: 59 | Source: Ambulatory Visit | Attending: Radiation Oncology | Admitting: Radiation Oncology

## 2023-08-24 ENCOUNTER — Other Ambulatory Visit: Payer: Self-pay | Admitting: *Deleted

## 2023-08-24 ENCOUNTER — Inpatient Hospital Stay: Payer: 59 | Attending: Hematology and Oncology | Admitting: Hematology and Oncology

## 2023-08-24 ENCOUNTER — Ambulatory Visit: Payer: Self-pay | Admitting: Surgery

## 2023-08-24 ENCOUNTER — Encounter: Payer: Self-pay | Admitting: Physical Therapy

## 2023-08-24 ENCOUNTER — Ambulatory Visit: Payer: 59 | Attending: Surgery | Admitting: Physical Therapy

## 2023-08-24 ENCOUNTER — Other Ambulatory Visit: Payer: Self-pay

## 2023-08-24 VITALS — BP 120/65 | HR 64 | Temp 97.8°F | Resp 16 | Wt 193.9 lb

## 2023-08-24 DIAGNOSIS — Z1731 Human epidermal growth factor receptor 2 positive status: Secondary | ICD-10-CM | POA: Diagnosis not present

## 2023-08-24 DIAGNOSIS — C50412 Malignant neoplasm of upper-outer quadrant of left female breast: Secondary | ICD-10-CM

## 2023-08-24 DIAGNOSIS — R293 Abnormal posture: Secondary | ICD-10-CM | POA: Insufficient documentation

## 2023-08-24 DIAGNOSIS — Z170 Estrogen receptor positive status [ER+]: Secondary | ICD-10-CM

## 2023-08-24 DIAGNOSIS — Z808 Family history of malignant neoplasm of other organs or systems: Secondary | ICD-10-CM | POA: Diagnosis not present

## 2023-08-24 DIAGNOSIS — Z90710 Acquired absence of both cervix and uterus: Secondary | ICD-10-CM | POA: Insufficient documentation

## 2023-08-24 DIAGNOSIS — G629 Polyneuropathy, unspecified: Secondary | ICD-10-CM | POA: Insufficient documentation

## 2023-08-24 DIAGNOSIS — Z1721 Progesterone receptor positive status: Secondary | ICD-10-CM | POA: Diagnosis not present

## 2023-08-24 LAB — CMP (CANCER CENTER ONLY)
ALT: 19 U/L (ref 0–44)
AST: 21 U/L (ref 15–41)
Albumin: 4 g/dL (ref 3.5–5.0)
Alkaline Phosphatase: 58 U/L (ref 38–126)
Anion gap: 6 (ref 5–15)
BUN: 14 mg/dL (ref 6–20)
CO2: 30 mmol/L (ref 22–32)
Calcium: 9.5 mg/dL (ref 8.9–10.3)
Chloride: 104 mmol/L (ref 98–111)
Creatinine: 0.69 mg/dL (ref 0.44–1.00)
GFR, Estimated: >60 mL/min (ref >60)
Glucose, Bld: 85 mg/dL (ref 70–99)
Potassium: 4 mmol/L (ref 3.5–5.1)
Sodium: 140 mmol/L (ref 135–145)
Total Bilirubin: 0.7 mg/dL (ref 0.0–1.2)
Total Protein: 6.8 g/dL (ref 6.5–8.1)

## 2023-08-24 LAB — CBC WITH DIFFERENTIAL (CANCER CENTER ONLY)
Abs Immature Granulocytes: 0.02 10*3/uL (ref 0.00–0.07)
Basophils Absolute: 0 10*3/uL (ref 0.0–0.1)
Basophils Relative: 0 %
Eosinophils Absolute: 0 10*3/uL (ref 0.0–0.5)
Eosinophils Relative: 1 %
HCT: 41.3 % (ref 36.0–46.0)
Hemoglobin: 13.5 g/dL (ref 12.0–15.0)
Immature Granulocytes: 0 %
Lymphocytes Relative: 19 %
Lymphs Abs: 1.2 10*3/uL (ref 0.7–4.0)
MCH: 27.5 pg (ref 26.0–34.0)
MCHC: 32.7 g/dL (ref 30.0–36.0)
MCV: 84.1 fL (ref 80.0–100.0)
Monocytes Absolute: 0.4 10*3/uL (ref 0.1–1.0)
Monocytes Relative: 7 %
Neutro Abs: 4.5 10*3/uL (ref 1.7–7.7)
Neutrophils Relative %: 73 %
Platelet Count: 244 10*3/uL (ref 150–400)
RBC: 4.91 MIL/uL (ref 3.87–5.11)
RDW: 14.1 % (ref 11.5–15.5)
WBC Count: 6.1 10*3/uL (ref 4.0–10.5)
nRBC: 0 % (ref 0.0–0.2)

## 2023-08-24 LAB — GENETIC SCREENING ORDER

## 2023-08-24 NOTE — Research (Signed)
 Trial:  [employer] 2021-05 - Specimen Collection Study to Evaluate Biomarkers in Subjects with Cancer   Patient Martha Washington was identified by Dr. Arno Bibles as a potential candidate for the above listed study.  This Clinical Research Nurse met with Martha Washington, [id number], on 08/24/23 in a manner and location that ensures patient privacy to discuss participation in the above listed research study.  [name]  Patient reads, speaks, and understands [place].   Patient was provided with the business card of this Nurse and encouraged to contact the research team with any questions.  Approximately 5 minutes were spent with the patient reviewing the informed consent documents.  Patient was provided the option of taking informed consent documents home to review and was encouraged to review at their convenience with their support network, including other care providers. Patient took the consent documents home to review. Patient agreed for research nurse to call on Monday 08/29/23 to follow up.  Aurora Lees, BSN, RN, [employer] Clinical Research Nurse II [phone] 08/24/2023 3:18 PM

## 2023-08-24 NOTE — H&P (Signed)
 Subjective   Chief Complaint: Breast Cancer   Breast MDC 08/24/23 [place]/ [place]  History of Present Illness: Martha Washington is a 58 y.o. female who is seen today as an office consultation at the request of Dr. Arno Bibles for evaluation of Breast Cancer .   This is a 58 year old female who recently underwent screening mammogram.  She was recalled for calcifications in the left upper outer quadrant.  She underwent further examination including diagnostic mammogram and ultrasound.  This revealed a 3.2 x 2.2 x 2.9 cm area of calcifications.  Ultrasound showed an irregular hypoechoic mass located at 2:00, 9 cm from the nipple.  This measured 2.1 x 1.6 x 1.6 cm.  There is a satellite mass approximately 2 cm away located at 2:00, 7 cm from the nipple.  This measures 0.4 x 0.3 x 0.2 cm.  Ultrasound of the axilla was negative.  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   Review of Systems: A complete review of systems was obtained from the patient.  I have reviewed this information and discussed as appropriate with the patient.  See HPI as well for other ROS.  Review of Systems  Constitutional: Negative.   HENT: Negative.    Eyes: Negative.   Respiratory: Negative.    Cardiovascular: Negative.   Gastrointestinal: Negative.   Genitourinary: Negative.   Musculoskeletal: Negative.   Skin: Negative.   Neurological: Negative.   Endo/Heme/Allergies: Negative.   Psychiatric/Behavioral: Negative.        Medical History: Past Medical History:  Diagnosis Date   Anxiety    Arthritis    GERD (gastroesophageal reflux disease)    History of cancer     Patient Active Problem List  Diagnosis   Allergic rhinitis   Atrial fibrillation with RVR (CMS/HHS-HCC)   Cervicalgia   Chronic low back pain   Degenerative spondylolisthesis   Depressive disorder   Gastroesophageal reflux disease   High cholesterol   Lumbar radiculopathy     Past Surgical History:  Procedure Laterality Date   HYSTERECTOMY     JOINT REPLACEMENT       Allergies  Allergen Reactions   Opioids - Morphine Analogues Dizziness, Nausea And Vomiting, Other (See Comments), Tinnitus and Vomiting    Made patient pass out  "passed out when taking after last surgery"   Azithromycin Abdominal Pain and Other (See Comments)    Stomach Cramps   Erythromycin Other (See Comments)    Other reaction(s): Other, stomach upset   Other Nausea And Vomiting    Current Outpatient Medications on File Prior to Visit  Medication Sig Dispense Refill   escitalopram  oxalate (LEXAPRO ) 10 MG tablet Take 1 tablet by mouth once daily     ZEPBOUND  2.5 mg/0.5 mL pen injector Inject 2.5 mg subcutaneously     ZEPBOUND  5 mg/0.5 mL pen injector Inject 5 mg subcutaneously     No current facility-administered medications on file prior to visit.    Family History  Problem Relation Age of Onset   Hyperlipidemia (Elevated cholesterol) Mother    High blood pressure (Hypertension) Mother    Obesity Father    Coronary Artery Disease (Blocked arteries around heart) Father    High blood pressure (Hypertension) Father    Hyperlipidemia (Elevated cholesterol) Father      Social History   Tobacco Use  Smoking Status Never  Smokeless Tobacco Never     Social History   Socioeconomic History   Marital  status: Married  Tobacco Use   Smoking status: Never   Smokeless tobacco: Never  Substance and Sexual Activity   Alcohol use: Not Currently   Drug use: Not Currently   Social Drivers of Health    Received from [employer]   Social Network    Objective:    Physical Exam   Constitutional:  WDWN in NAD, conversant, no obvious deformities; lying in bed comfortably Eyes:  Pupils equal, round; sclera anicteric; moist conjunctiva; no lid lag HENT:  Oral mucosa moist; good dentition  Neck:  No masses palpated, trachea midline; no thyromegaly Lungs:  CTA  bilaterally; normal respiratory effort Breasts:  symmetric, no nipple changes; no palpable masses or lymphadenopathy on either side except for a small hematoma and ecchymosis at the left breast biopsy site CV:  Regular rate and rhythm; no murmurs; extremities well-perfused with no edema Abd:  +bowel sounds, soft, non-tender, no palpable organomegaly; no palpable hernias Musc:  Normal gait; no apparent clubbing or cyanosis in extremities Lymphatic:  No palpable cervical or axillary lymphadenopathy Skin:  Warm, dry; no sign of jaundice Psychiatric - alert and oriented x 4; calm mood and affect   Labs, Imaging and Diagnostic Testing:  FINAL DIAGNOSIS        1. Breast, left, needle core biopsy, 2 o'clock, 9cmfn, heart clip :       - INVASIVE DUCTAL CARCINOMA, SEE NOTE       - TUBULE FORMATION: SCORE 3/3       - NUCLEAR PLEOMORPHISM: SCORE 3/3       - MITOTIC COUNT: SCORE 1/3       - TOTAL SCORE: 7/9       - OVERALL GRADE: 2/3       - LYMPHOVASCULAR INVASION: NOT IDENTIFIED       - CANCER LENGTH: 9 MM       - CALCIFICATIONS: PRESENT       - OTHER FINDINGS: AREAS OF NECROSIS ARE PRESENT    PROGNOSTIC INDICATORS   Results:  IMMUNOHISTOCHEMICAL AND MORPHOMETRIC ANALYSIS PERFORMED MANUALLY  The tumor cells are POSITIVE for Her2 (3+).  Estrogen Receptor:  80%, POSITIVE, MODERATE-STRONG STAINING INTENSITY  Progesterone Receptor:  20%, POSITIVE, MODERATE-STRONG STAINING INTENSITY  Proliferation Marker Ki67:  25%   CLINICAL DATA:  Recall from screening mammography, new calcifications involving the UPPER OUTER QUADRANT of the LEFT breast at posterior depth.   EXAM: DIGITAL DIAGNOSTIC UNILATERAL LEFT MAMMOGRAM WITH CAD; ULTRASOUND LEFT BREAST LIMITED   TECHNIQUE: Left digital diagnostic mammography was performed. ; Targeted ultrasound examination of the left breast was performed.   COMPARISON:  Previous exam(s).   ACR Breast Density Category b: There are scattered areas  of fibroglandular density.   FINDINGS: Spot magnification CC and MLO views of the calcifications and a full field mediolateral view were obtained.   There is a group of coarse heterogeneous calcifications associated with an asymmetry in the UPPER OUTER QUADRANT at posterior depth spanning approximately 3.2 x 2.2 x 2.9 cm (AP x transverse x CC). There are linear and branching forms within the large group. There is a possible mass at the posterior margin of the calcifications measuring just under 1 cm in size.   Targeted ultrasound is performed, demonstrating an irregular hypoechoic mass with angular margins and associated calcifications at 2 o'clock 9 cm from the nipple measuring approximately 2.1 x 1.6 x 1.6 cm, demonstrating posterior acoustic shadowing and demonstrating internal power Doppler flow. There is a 0.4 x 0.3 x 0.2 cm  satellite mass at 2 o'clock 7 cm from the nipple which is approximately 2 cm removed from the dominant mass.   Sonographic evaluation of the LEFT axilla demonstrates no pathologic lymphadenopathy.   IMPRESSION: 1. Highly suspicious 2.1 cm mass in the UPPER OUTER QUADRANT of the LEFT breast at 2 o'clock 9 cm from the nipple with associated highly suspicious calcifications which span 3.2 cm. 2. Satellite 0.4 cm mass at 2 o'clock 7 cm from the nipple. 3. No pathologic LEFT axillary lymphadenopathy.   RECOMMENDATION: 1. Ultrasound-guided core needle biopsy of the highly suspicious LEFT breast mass at 2 o'clock 9 cm from the nipple and the satellite mass at 2 o'clock 7 cm from the nipple. 2. Consider Breast MRI without and with contrast to confirm extent of disease and be sure that there is no noncalcified DCIS elsewhere.   I have discussed the findings and recommendations with the patient. The ultrasound core needle biopsy procedure was discussed with the patient and her questions were answered. She wishes to proceed with the biopsy which has been  scheduled by [facility] staff.   BI-RADS CATEGORY  5: Highly suggestive of malignancy.     Electronically Signed   By: Rinda Cheers M.D.   On: 08/16/2023 10:53    Assessment and Plan:  Diagnoses and all orders for this visit:  Invasive ductal carcinoma of breast, left (CMS/HHS-HCC)     Recommend bilateral breast MRI to determine the extent of the disease due to the wider area of calcifications.  The satellite nodule also needs to be evaluated.  Based on her prognostic panel, Oncology recommends neoadjuvant chemotherapy.  Once she has completed her course of treatment, she will likely need a left radioactive seed localized lumpectomy versus mastectomy with or without reconstruction.  We also discussed the possibility of risk reducing right mastectomy.  We will plan ultrasound-guided port placement in the near future.  The surgical procedure has been discussed with the patient.  Potential risks, benefits, alternative treatments, and expected outcomes have been explained.  All of the patient's questions at this time have been answered.  The likelihood of reaching the patient's treatment goal is good.  The patient understands the proposed surgical procedure and wishes to proceed.  We will discuss further surgical planning after she has completed her chemotherapy.    Gerhardt Knudsen, MD  08/24/2023 3:41 PM

## 2023-08-24 NOTE — Assessment & Plan Note (Signed)
 Invasive Ductal Carcinoma, Left Breast Newly diagnosed, HER2 positive, estrogen receptor positive, with a primary mass measuring 2.1 cm and a satellite nodule of 4 mm. The cancer is localized to the breast with no palpable lymph nodes. Discussed the importance of chemotherapy prior to surgery for potential tumor shrinkage, prognostic information, and to adjust post-surgical treatment if necessary. -Plan to initiate TCHP chemotherapy regimen (Carboplatin , Docetaxel , Herceptin , Perjeta ) for six cycles, every 21 days. -Consider use of DigniCap to minimize hair loss during chemotherapy. -Order port placement for chemotherapy administration. -Order echocardiogram prior to chemotherapy initiation. -Order breast MRI to assess contralateral breast and monitor response to chemotherapy. -Consider genetic testing for potential familial predisposition to breast cancer.  Neuropathy Reports of tingling and numbness in the right foot, possibly related to previous back surgery. -Monitor for potential exacerbation during chemotherapy, as neuropathy can be a side effect.  General Health Maintenance / Followup Plans -Attend chemotherapy class to learn more about the treatment process and side effects. -Follow-up after completion of chemotherapy for surgical planning. -Post-chemotherapy, continue with Herceptin  and Perjeta  for a total of one year vs kadcyla.

## 2023-08-24 NOTE — Therapy (Signed)
 OUTPATIENT PHYSICAL THERAPY BREAST CANCER BASELINE EVALUATION   Patient Name: Martha Washington MRN: [id number] DOB:[birth year], 58 y.o., female Today's Date: 08/24/2023  END OF SESSION:  PT End of Session - 08/24/23 1611     Visit Number 1    Number of Visits 2    Date for PT Re-Evaluation 02/21/24    PT Start Time 1421    PT Stop Time 1426   Also saw pt from 1441-1500 for a total of 24 min   PT Time Calculation (min) 5 min    Activity Tolerance Patient tolerated treatment well    Behavior During Therapy [facility] for tasks assessed/performed             Past Medical History:  Diagnosis Date   Atrial fibrillation with RVR (HCC)    GERD (gastroesophageal reflux disease)    no meds   High cholesterol    Menorrhagia 07/13/2013   Missed abortion    resolved - no surgery required   New onset atrial fibrillation (HCC) 08/04/2017   with RVR to the 160s; S/P hammertoe OR/notes 08/04/2017   S/P bunionectomy 08/04/2017   SVD (spontaneous vaginal delivery) X 2   Tachycardia 08/04/2017   Past Surgical History:  Procedure Laterality Date   ABDOMINAL HYSTERECTOMY     BACK SURGERY     BREAST BIOPSY Left 08/16/2023   US  LT BREAST BX W LOC DEV 1ST LESION IMG BX SPEC US  GUIDE 08/16/2023 [facility]   BUNIONECTOMY WITH HAMMERTOE RECONSTRUCTION Right 03/2017   BUNIONECTOMY WITH HAMMERTOE RECONSTRUCTION Left 08/04/2017   BUNIONECTOMY WITH HAMMERTOE RECONSTRUCTION Left 08/04/2017   Procedure: Left modified McBride and Lapidus bunion corrections; Left 2-3 Weil and hammertoe corrections;  Surgeon: Amada Backer, MD;  Location: [facility];  Service: Orthopedics;  Laterality: Left;   ROBOTIC ASSISTED TOTAL HYSTERECTOMY WITH BILATERAL SALPINGO OOPHERECTOMY N/A 07/13/2013   Procedure: ROBOTIC ASSISTED TOTAL HYSTERECTOMY WITH BILATERAL SALPINGECTOMY;  Surgeon: Mckinley Spells, MD;  Location: [facility];  Service: Gynecology;  Laterality: N/A;   SHOULDER ARTHROSCOPY W/ ROTATOR CUFF REPAIR  Right 2010   SHOULDER ARTHROSCOPY WITH ROTATOR CUFF REPAIR Right 10/17/2014   Procedure: SHOULDER ARTHROSCOPY WITH ROTATOR CUFF REPAIR;  Surgeon: Sandie Cross, MD;  Location: [facility];  Service: Orthopedics;  Laterality: Right;   TUBAL LIGATION     WEIL OSTEOTOMY Left 08/04/2017   Procedure: Left 2nd and 3rd weil osteotomy;  Surgeon: Amada Backer, MD;  Location: [facility];  Service: Orthopedics;  Laterality: Left;   WISDOM TOOTH EXTRACTION     Patient Active Problem List   Diagnosis Date Noted   Malignant neoplasm of upper-outer quadrant of left breast in female, estrogen receptor positive (HCC) 08/22/2023   GERD (gastroesophageal reflux disease) 04/06/2021   High cholesterol 04/06/2021   Missed abortion 04/06/2021   Syncope 11/14/2020   Degenerative spondylolisthesis 09/01/2018   Atrial fibrillation with RVR (HCC)    Tachycardia 08/04/2017   S/P bunionectomy 08/04/2017   SVD (spontaneous vaginal delivery) 08/04/2017   Menorrhagia 07/13/2013     REFERRING PROVIDER: Dr. Dareen Ebbing  REFERRING DIAG: Left breast cancer  THERAPY DIAG:  Malignant neoplasm of upper-outer quadrant of left breast in female, estrogen receptor positive (HCC)  Abnormal posture  Rationale for Evaluation and Treatment: Rehabilitation  ONSET DATE: 08/16/2023  SUBJECTIVE:  SUBJECTIVE STATEMENT: Patient reports she is here today to be seen by her medical team for her newly diagnosed left breast cancer.   PERTINENT HISTORY:  Patient was diagnosed on 08/16/2023 with left grade 2 invasive ductal carcinoma breast cancer. It measures 2.1 cm cm and is located in the upper outer quadrant. It is triple positive with a Ki67 of 25%. She has had a right rotator cuff repair twice.   PATIENT GOALS:    reduce lymphedema risk and learn post op HEP.   PAIN:  Are you having pain? Yes: NPRS scale: 4-5/10 Pain location: lower back Pain description: achy and tight Aggravating factors: unknown; chronic problem Relieving factors: unknown  PRECAUTIONS: Active CA   RED FLAGS: None   HAND DOMINANCE: right  WEIGHT BEARING RESTRICTIONS: No  FALLS:  Has patient fallen in last 6 months? No  LIVING ENVIRONMENT: Patient lives with: her husband Lives in: House/apartment Has following equipment at home: None  OCCUPATION: retired  LEISURE: she walks on a treadmill 6x/week for 30 min  PRIOR LEVEL OF FUNCTION: Independent   OBJECTIVE: Note: Objective measures were completed at Evaluation unless otherwise noted.  COGNITION: Overall cognitive status: Within functional limits for tasks assessed    POSTURE:  Forward head and rounded shoulders posture  UPPER EXTREMITY AROM/PROM:  A/PROM RIGHT   eval   Shoulder extension 56  Shoulder flexion 140  Shoulder abduction 157  Shoulder internal rotation 65  Shoulder external rotation 80    (Blank rows = not tested)  A/PROM LEFT   eval  Shoulder extension 48  Shoulder flexion 144  Shoulder abduction 156  Shoulder internal rotation 68  Shoulder external rotation 87    (Blank rows = not tested)  CERVICAL AROM: All within normal limits  UPPER EXTREMITY STRENGTH: WNL  LYMPHEDEMA ASSESSMENTS (in cm):   LANDMARK RIGHT   eval  10 cm proximal to olecranon process 31  Olecranon process 25.9  10 cm proximal to ulnar styloid process 22.5  Just proximal to ulnar styloid process 15.9  Across hand at thumb web space 17.9  At base of 2nd digit 6.2  (Blank rows = not tested)  LANDMARK LEFT   eval  10 cm proximal to olecranon process 30.2  Olecranon process 26.5  10 cm proximal to ulnar styloid process 22.2  Just proximal to ulnar styloid process 16.1  Across hand at thumb web space 17.2  At base of 2nd digit 5.9  (Blank rows =  not tested)  L-DEX LYMPHEDEMA SCREENING:  The patient was assessed using the L-Dex machine today to produce a lymphedema index baseline score. The patient will be reassessed on a regular basis (typically every 3 months) to obtain new L-Dex scores. If the score is > 6.5 points away from his/her baseline score indicating onset of subclinical lymphedema, it will be recommended to wear a compression garment for 4 weeks, 12 hours per day and then be reassessed. If the score continues to be > 6.5 points from baseline at reassessment, we will initiate lymphedema treatment. Assessing in this manner has a 95% rate of preventing clinically significant lymphedema.   L-DEX FLOWSHEETS - 08/24/23 1600       L-DEX LYMPHEDEMA SCREENING   Measurement Type Unilateral    L-DEX MEASUREMENT EXTREMITY Upper Extremity    POSITION  Standing    DOMINANT SIDE Right    At Risk Side Left    BASELINE SCORE (UNILATERAL) 4.2  QUICK DASH SURVEY:  Cindia Crease - 08/24/23 0001     Open a tight or new jar Mild difficulty    Do heavy household chores (wash walls, wash floors) No difficulty    Carry a shopping bag or briefcase No difficulty    Wash your back No difficulty    Use a knife to cut food No difficulty    Recreational activities in which you take some force or impact through your arm, shoulder, or hand (golf, hammering, tennis) No difficulty    During the past week, to what extent has your arm, shoulder or hand problem interfered with your normal social activities with family, friends, neighbors, or groups? Not at all    During the past week, to what extent has your arm, shoulder or hand problem limited your work or other regular daily activities Not at all    Arm, shoulder, or hand pain. None    Tingling (pins and needles) in your arm, shoulder, or hand None    Difficulty Sleeping No difficulty    DASH Score 2.27 %              PATIENT EDUCATION:  Education details: Lymphedema risk  reduction and post op shoulder/posture HEP Person educated: Patient Education method: Explanation, Demonstration, Handout Education comprehension: Patient verbalized understanding and returned demonstration  HOME EXERCISE PROGRAM: Patient was instructed today in a home exercise program today for post op shoulder range of motion. These included active assist shoulder flexion in sitting, scapular retraction, wall walking with shoulder abduction, and hands behind head external rotation.  She was encouraged to do these twice a day, holding 3 seconds and repeating 5 times when permitted by her physician.   ASSESSMENT:  CLINICAL IMPRESSION: Patient was diagnosed on 08/16/2023 with left grade 2 invasive ductal carcinoma breast cancer. It measures 2.1 cm cm and is located in the upper outer quadrant. It is triple positive with a Ki67 of 25%. She has had a right rotator cuff repair twice. Her multidisciplinary medical team met prior to her assessments to determine a recommended treatment plan. She is planning to have neoadjuvant chemotherapy followed by a left lumpectomy and sentinel node biopsy, radiation, and anti-estrogen therapy. She will benefit from a post op PT reassessment to determine needs and from L-Dex screens every 3 months for 2 years to detect subclinical lymphedema.  Pt will benefit from skilled therapeutic intervention to improve on the following deficits: Decreased knowledge of precautions, impaired UE functional use, pain, decreased ROM, postural dysfunction.   PT treatment/interventions: ADL/self-care home management, pt/family education, therapeutic exercise  REHAB POTENTIAL: Excellent  CLINICAL DECISION MAKING: Stable/uncomplicated  EVALUATION COMPLEXITY: Low   GOALS: Goals reviewed with patient? YES  LONG TERM GOALS: (STG=LTG)    Name Target Date Goal status  1 Pt will be able to verbalize understanding of pertinent lymphedema risk reduction practices relevant to her dx  specifically related to skin care.  Baseline:  No knowledge 08/24/2023 Achieved at eval  2 Pt will be able to return demo and/or verbalize understanding of the post op HEP related to regaining shoulder ROM. Baseline:  No knowledge 08/24/2023 Achieved at eval  3 Pt will be able to verbalize understanding of the importance of attending the post op After Breast CA Class for further lymphedema risk reduction education and therapeutic exercise.  Baseline:  No knowledge 08/24/2023 Achieved at eval  4 Pt will demo she has regained full shoulder ROM and function post operatively compared to baselines.  Baseline: See  objective measurements taken today. 02/21/2024     PLAN:  PT FREQUENCY/DURATION: EVAL and 1 follow up appointment.   PLAN FOR NEXT SESSION: will reassess 3-4 weeks post op to determine needs.   Patient will follow up at outpatient cancer rehab 3-4 weeks following surgery.  If the patient requires physical therapy at that time, a specific plan will be dictated and sent to the referring physician for approval. The patient was educated today on appropriate basic range of motion exercises to begin post operatively and the importance of attending the After Breast Cancer class following surgery.  Patient was educated today on lymphedema risk reduction practices as it pertains to recommendations that will benefit the patient immediately following surgery.  She verbalized good understanding.    Physical Therapy Information for After Breast Cancer Surgery/Treatment:  Lymphedema is a swelling condition that you may be at risk for in your arm if you have lymph nodes removed from the armpit area.  After a sentinel node biopsy, the risk is approximately 5-9% and is higher after an axillary node dissection.  There is treatment available for this condition and it is not life-threatening.  Contact your physician or physical therapist with concerns. You may begin the 4 shoulder/posture exercises (see additional  sheet) when permitted by your physician (typically a week after surgery).  If you have drains, you may need to wait until those are removed before beginning range of motion exercises.  A general recommendation is to not lift your arms above shoulder height until drains are removed.  These exercises should be done to your tolerance and gently.  This is not a "no pain/no gain" type of recovery so listen to your body and stretch into the range of motion that you can tolerate, stopping if you have pain.  If you are having immediate reconstruction, ask your plastic surgeon about doing exercises as he or she may want you to wait. We encourage you to attend the free one time ABC (After Breast Cancer) class offered by [facility].  You will learn information related to lymphedema risk, prevention and treatment and additional exercises to regain mobility following surgery.  You can call [phone] for more information.  This is offered the 1st and 3rd Monday of each month.  You only attend the class one time. While undergoing any medical procedure or treatment, try to avoid blood pressure being taken or needle sticks from occurring on the arm on the side of cancer.   This recommendation begins after surgery and continues for the rest of your life.  This may help reduce your risk of getting lymphedema (swelling in your arm). An excellent resource for those seeking information on lymphedema is the National Lymphedema Network's web site. It can be accessed at www.lymphnet.org If you notice swelling in your hand, arm or breast at any time following surgery (even if it is many years from now), please contact your doctor or physical therapist to discuss this.  Lymphedema can be treated at any time but it is easier for you if it is treated early on.  If you feel like your shoulder motion is not returning to normal in a reasonable amount of time, please contact your surgeon or physical  therapist.  [facility] [phone]. [address], [address], [place] [address]  ABC CLASS After Breast Cancer Class  After Breast Cancer Class is a specially designed exercise class to assist you in a safe recover after having  breast cancer surgery.  In this class you will learn how to get back to full function whether your drains were just removed or if you had surgery a month ago.  This one-time class is held the 1st and 3rd Monday of every month from 11:00 a.m. until 12:00 noon virtually.  This class is FREE and space is limited. For more information or to register for the next available class, call [phone].  Class Goals  Understand specific stretches to improve the flexibility of you chest and shoulder. Learn ways to safely strengthen your upper body and improve your posture. Understand the warning signs of infection and why you may be at risk for an arm infection. Learn about Lymphedema and prevention.  ** You do not attend this class until after surgery.  Drains must be removed to participate  Patient was instructed today in a home exercise program today for post op shoulder range of motion. These included active assist shoulder flexion in sitting, scapular retraction, wall walking with shoulder abduction, and hands behind head external rotation.  She was encouraged to do these twice a day, holding 3 seconds and repeating 5 times when permitted by her physician.  Rollin Clock, [place] 08/24/23 4:23 PM

## 2023-08-24 NOTE — Research (Signed)
 Trial:  S2205, ICE COMPRESS: RANDOMIZED TRIAL OF LIMB CRYOCOMPRESSION VERSUS CONTINUOUS COMPRESSION VERSUS LOW CYCLIC COMPRESSION FOR THE PREVENTION OF TAXANE-INDUCED PERIPHERAL NEUROPATHY  Patient Martha Washington was identified by Dr. Arno Bibles as a potential candidate for the above listed study.  This Clinical Research Nurse met with Martha Washington, [id number], on 08/24/23 in a manner and location that ensures patient privacy to discuss participation in the above listed research study.  Patient is Accompanied by her husband .  A copy of the informed consent document and separate HIPAA Authorization was provided to the patient.  Patient reads, speaks, and understands [place].   Patient was provided with the business card of this Nurse and encouraged to contact the research team with any questions.  Approximately 5 minutes were spent with the patient reviewing the informed consent documents.  Patient was provided the option of taking informed consent documents home to review and was encouraged to review at their convenience with their support network, including other care providers. Patient took the consent documents home to review. Patient agreed for research nurse to follow up with her on Monday 08/29/23 with phone call.  Aurora Lees, BSN, RN, [employer] Research Nurse II [phone] 08/24/2023 3:21 PM

## 2023-08-24 NOTE — Progress Notes (Signed)
 START ON PATHWAY REGIMEN - Breast     Cycle 1: A cycle is 21 days:     Pertuzumab      Trastuzumab-xxxx      Docetaxel      Carboplatin    Cycles 2 through 6: A cycle is every 21 days:     Pertuzumab      Trastuzumab-xxxx      Docetaxel      Carboplatin   **Always confirm dose/schedule in your pharmacy ordering system**  Patient Characteristics: Preoperative or Nonsurgical Candidate, M0 (Clinical Staging), Up to cT4c, Any N, M0, Neoadjuvant Therapy followed by Surgery, Invasive Disease, Chemotherapy, HER2 Positive, ER Positive Therapeutic Status: Preoperative or Nonsurgical Candidate, M0 (Clinical Staging) AJCC M Category: cM0 AJCC Grade: G2 ER Status: Positive (+) AJCC 8 Stage Grouping: IB HER2 Status: Positive (+) AJCC T Category: cT2 AJCC N Category: cN0 PR Status: Positive (+) Breast Surgical Plan: Neoadjuvant Therapy followed by Surgery Intent of Therapy: Curative Intent, Discussed with Patient

## 2023-08-24 NOTE — Progress Notes (Signed)
 [facility] CONSULT NOTE  Patient Care Team: Ashby Lawman, PA-C as PCP - General (Obstetrics and Gynecology) Tobb, Kardie, DO as PCP - Cardiology (Cardiology) Auther Bo, RN as Oncology Nurse Navigator Alane Hsu, RN as Oncology Nurse Navigator Murleen Arms, MD as Consulting Physician (Hematology and Oncology) Dareen Ebbing, MD as Consulting Physician (General Surgery) Colie Dawes, MD as Attending Physician (Radiation Oncology)  CHIEF COMPLAINTS/PURPOSE OF CONSULTATION:  Newly diagnosed breast cancer  HISTORY OF PRESENTING ILLNESS:  Martha Washington 58 y.o. female is here because of recent diagnosis of left breast cancer  I reviewed her records extensively and collaborated the history with the patient.  SUMMARY OF ONCOLOGIC HISTORY: Oncology History  Malignant neoplasm of upper-outer quadrant of left breast in female, estrogen receptor positive (HCC)  08/04/2023 Mammogram   Further evaluation is suggested for calcifications in the left breast.  There is a group of coarse heterogeneous calcifications associated with an asymmetry in the UPPER OUTER QUADRANT at posterior depth spanning approximately 3.2 x 2.2 x 2.9 cm (AP x transverse x CC). There are linear and branching forms within the large group. There is a possible mass at the posterior margin of the calcifications measuring just under 1 cm in size.   Targeted ultrasound is performed, demonstrating an irregular hypoechoic mass with angular margins and associated calcifications at 2 o'clock 9 cm from the nipple measuring approximately 2.1 x 1.6 x 1.6 cm, demonstrating posterior acoustic shadowing and demonstrating internal power Doppler flow. There is a 0.4 x 0.3 x 0.2 cm satellite mass at 2 o'clock 7 cm from the nipple which is approximately 2 cm removed from the dominant mass.     08/16/2023 Pathology Results     08/22/2023 Initial Diagnosis   Malignant neoplasm of upper-outer quadrant of  left breast in female, estrogen receptor positive (HCC)   08/24/2023 Cancer Staging   Staging form: Breast, AJCC 8th Edition - Clinical stage from 08/24/2023: Stage IB (cT2, cN0, cM0, G2, ER+, PR+, HER2+) - Signed by Murleen Arms, MD on 08/24/2023 Stage prefix: Initial diagnosis Histologic grading system: 3 grade system Laterality: Left Staged by: Pathologist and managing physician Stage used in treatment planning: Yes National guidelines used in treatment planning: Yes Type of national guideline used in treatment planning: NCCN   09/07/2023 -  Chemotherapy   Patient is on Treatment Plan : BREAST  Docetaxel  + Carboplatin  + Trastuzumab  + Pertuzumab   (TCHP) q21d       The patient, a 58 year old postmenopausal individual with a history of back surgery, presents for discussion of recent diagnosis of invasive ductal breast cancer. The cancer was discovered incidentally during a mammogram, which was scheduled after the patient realized she had not had a mammogram since 2018. The patient reports occasional sharp pains in the breast, but otherwise has no symptoms. The patient has a family history of bone cancer in a brother. The patient is currently on a clean eating diet and has stopped taking Zepbound , she used it for approximately 3 months. The patient experiences occasional numbness in the right foot, which she attributes to her history of back surgery. Rest of the pertinent 10 point ROS reviewed and neg.  MEDICAL HISTORY:  Past Medical History:  Diagnosis Date   Atrial fibrillation with RVR (HCC)    GERD (gastroesophageal reflux disease)    no meds   High cholesterol    Menorrhagia 07/13/2013   Missed abortion    resolved - no surgery required   New onset atrial  fibrillation (HCC) 08/04/2017   with RVR to the 160s; S/P hammertoe OR/notes 08/04/2017   S/P bunionectomy 08/04/2017   SVD (spontaneous vaginal delivery) X 2   Tachycardia 08/04/2017    SURGICAL HISTORY: Past Surgical  History:  Procedure Laterality Date   ABDOMINAL HYSTERECTOMY     BACK SURGERY     BREAST BIOPSY Left 08/16/2023   US  LT BREAST BX W LOC DEV 1ST LESION IMG BX SPEC US  GUIDE 08/16/2023 [facility] MAMMOGRAPHY   BUNIONECTOMY WITH HAMMERTOE RECONSTRUCTION Right 03/2017   BUNIONECTOMY WITH HAMMERTOE RECONSTRUCTION Left 08/04/2017   BUNIONECTOMY WITH HAMMERTOE RECONSTRUCTION Left 08/04/2017   Procedure: Left modified McBride and Lapidus bunion corrections; Left 2-3 Weil and hammertoe corrections;  Surgeon: Amada Backer, MD;  Location: [facility];  Service: Orthopedics;  Laterality: Left;   ROBOTIC ASSISTED TOTAL HYSTERECTOMY WITH BILATERAL SALPINGO OOPHERECTOMY N/A 07/13/2013   Procedure: ROBOTIC ASSISTED TOTAL HYSTERECTOMY WITH BILATERAL SALPINGECTOMY;  Surgeon: Mckinley Spells, MD;  Location: [facility];  Service: Gynecology;  Laterality: N/A;   SHOULDER ARTHROSCOPY W/ ROTATOR CUFF REPAIR Right 2010   SHOULDER ARTHROSCOPY WITH ROTATOR CUFF REPAIR Right 10/17/2014   Procedure: SHOULDER ARTHROSCOPY WITH ROTATOR CUFF REPAIR;  Surgeon: Sandie Cross, MD;  Location: [facility];  Service: Orthopedics;  Laterality: Right;   TUBAL LIGATION     WEIL OSTEOTOMY Left 08/04/2017   Procedure: Left 2nd and 3rd weil osteotomy;  Surgeon: Amada Backer, MD;  Location: [facility];  Service: Orthopedics;  Laterality: Left;   WISDOM TOOTH EXTRACTION      SOCIAL HISTORY: Social History   Socioeconomic History   Marital status: Married    Spouse name: Merchant navy officer   Number of children: Not on file   Years of education: Not on file   Highest education level: Not on file  Occupational History   Not on file  Tobacco Use   Smoking status: Never   Smokeless tobacco: Never  Vaping Use   Vaping status: Never Used  Substance and Sexual Activity   Alcohol use: No   Drug use: No   Sexual activity: Not on file  Other Topics Concern   Not on file  Social History Narrative   Lives with  husband   Right Handed   Drinks 1 cup caffeine daily   Social Drivers of Corporate investment banker Strain: Not on file  Food Insecurity: Not on file  Transportation Needs: Not on file  Physical Activity: Not on file  Stress: Not on file  Social Connections: Unknown (12/22/2021)   Received from [facility], [facility]   Social Network    Social Network: Not on file  Intimate Partner Violence: Unknown (11/13/2021)   Received from [facility], [facility]   HITS    Physically Hurt: Not on file    Insult or Talk Down To: Not on file    Threaten Physical Harm: Not on file    Scream or Curse: Not on file    FAMILY HISTORY: Family History  Problem Relation Age of Onset   Hypertension Mother    Hypertension Father    Bone cancer Brother     ALLERGIES:  is allergic to other, azithromycin, morphine and codeine, and oxycodone .  MEDICATIONS:  Current Outpatient Medications  Medication Sig Dispense Refill   Ascorbic Acid (VITAMIN C) 1000 MG tablet Take 1,000 mg by mouth daily.     b complex vitamins capsule Take 1 capsule by mouth daily.     escitalopram  (  LEXAPRO ) 10 MG tablet Take 1 tablet (10 mg total) by mouth daily. 90 tablet 0   VITAMIN D PO Take by mouth.     ZINC CITRATE PO Take by mouth.     No current facility-administered medications for this visit.    REVIEW OF SYSTEMS:   Constitutional: Denies fevers, chills or abnormal night sweats Eyes: Denies blurriness of vision, double vision or watery eyes Ears, nose, mouth, throat, and face: Denies mucositis or sore throat Respiratory: Denies cough, dyspnea or wheezes Cardiovascular: Denies palpitation, chest discomfort or lower extremity swelling Gastrointestinal:  Denies nausea, heartburn or change in bowel habits Skin: Denies abnormal skin rashes Lymphatics: Denies new lymphadenopathy or easy bruising Neurological:Denies numbness, tingling or new weaknesses Behavioral/Psych: Mood is stable, no new changes   Breast: Denies any palpable lumps or discharge All other systems were reviewed with the patient and are negative.  PHYSICAL EXAMINATION: ECOG PERFORMANCE STATUS: 0 - Asymptomatic  Vitals:   08/24/23 1226  BP: 120/65  Pulse: 64  Resp: 16  Temp: 97.8 F (36.6 C)  SpO2: 100%   Filed Weights   08/24/23 1226  Weight: 193 lb 14.4 oz (88 kg)    GENERAL:alert, no distress and comfortable SKIN: skin color, texture, turgor are normal, no rashes or significant lesions BREAST:post biopsy changes, No palpable adenopathy  LABORATORY DATA:  I have reviewed the data as listed Lab Results  Component Value Date   WBC 6.1 08/24/2023   HGB 13.5 08/24/2023   HCT 41.3 08/24/2023   MCV 84.1 08/24/2023   PLT 244 08/24/2023   Lab Results  Component Value Date   NA 140 08/24/2023   K 4.0 08/24/2023   CL 104 08/24/2023   CO2 30 08/24/2023    RADIOGRAPHIC STUDIES: I have personally reviewed the radiological reports and agreed with the findings in the report.  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 The cancer is localized to the breast with no palpable lymph nodes. Discussed the importance of chemotherapy prior to surgery for potential tumor shrinkage, prognostic information, and to adjust post-surgical treatment if necessary. -Plan to initiate TCHP chemotherapy regimen (Carboplatin , Docetaxel , Herceptin , Perjeta ) for six cycles, every 21 days. -Consider use of DigniCap to minimize hair loss during chemotherapy. -Order port placement for chemotherapy administration. -Order echocardiogram prior to chemotherapy initiation. -Order breast MRI to assess contralateral breast and monitor response to chemotherapy. -Consider genetic testing for  potential familial predisposition to breast cancer.  Neuropathy Reports of tingling and numbness in the right foot, possibly related to previous back surgery. -Monitor for potential exacerbation during chemotherapy, as neuropathy can be a side effect.  General Health Maintenance / Followup Plans -Attend chemotherapy class to learn more about the treatment process and side effects. -Follow-up after completion of chemotherapy for surgical planning. -Post-chemotherapy, continue with Herceptin  and Perjeta  for a total of one year vs kadcyla.  Time spent: 45 min. All questions were answered. The patient knows to call the clinic with any problems, questions or concerns.    Murleen Arms, MD 08/24/23

## 2023-08-24 NOTE — H&P (View-Only) (Signed)
 Subjective   Chief Complaint: Breast Cancer   Breast [facility] 08/24/23 [place] [place]  History of Present Illness: Martha Washington is a 58 y.o. female who is seen today as an office consultation at the request of Dr. Arno Bibles for evaluation of Breast Cancer .   This is a 58 year old female who recently underwent screening mammogram.  She was recalled for calcifications in the left upper outer quadrant.  She underwent further examination including diagnostic mammogram and ultrasound.  This revealed a 3.2 x 2.2 x 2.9 cm area of calcifications.  Ultrasound showed an irregular hypoechoic mass located at 2:00, 9 cm from the nipple.  This measured 2.1 x 1.6 x 1.6 cm.  There is a satellite mass approximately 2 cm away located at 2:00, 7 cm from the nipple.  This measures 0.4 x 0.3 x 0.2 cm.  Ultrasound of the axilla was negative.  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   Review of Systems: A complete review of systems was obtained from the patient.  I have reviewed this information and discussed as appropriate with the patient.  See HPI as well for other ROS.  Review of Systems  Constitutional: Negative.   HENT: Negative.    Eyes: Negative.   Respiratory: Negative.    Cardiovascular: Negative.   Gastrointestinal: Negative.   Genitourinary: Negative.   Musculoskeletal: Negative.   Skin: Negative.   Neurological: Negative.   Endo/Heme/Allergies: Negative.   Psychiatric/Behavioral: Negative.        Medical History: Past Medical History:  Diagnosis Date   Anxiety    Arthritis    GERD (gastroesophageal reflux disease)    History of cancer     Patient Active Problem List  Diagnosis   Allergic rhinitis   Atrial fibrillation with RVR (CMS/HHS-HCC)   Cervicalgia   Chronic low back pain   Degenerative spondylolisthesis   Depressive disorder   Gastroesophageal reflux disease   High cholesterol   Lumbar radiculopathy     Past Surgical History:  Procedure Laterality Date   HYSTERECTOMY     JOINT REPLACEMENT       Allergies  Allergen Reactions   Opioids - Morphine Analogues Dizziness, Nausea And Vomiting, Other (See Comments), Tinnitus and Vomiting    Made patient pass out  "passed out when taking after last surgery"   Azithromycin Abdominal Pain and Other (See Comments)    Stomach Cramps   Erythromycin Other (See Comments)    Other reaction(s): Other, stomach upset   Other Nausea And Vomiting    Current Outpatient Medications on File Prior to Visit  Medication Sig Dispense Refill   escitalopram  oxalate (LEXAPRO ) 10 MG tablet Take 1 tablet by mouth once daily     ZEPBOUND  2.5 mg/0.5 mL pen injector Inject 2.5 mg subcutaneously     ZEPBOUND  5 mg/0.5 mL pen injector Inject 5 mg subcutaneously     No current facility-administered medications on file prior to visit.    Family History  Problem Relation Age of Onset   Hyperlipidemia (Elevated cholesterol) Mother    High blood pressure (Hypertension) Mother    Obesity Father    Coronary Artery Disease (Blocked arteries around heart) Father    High blood pressure (Hypertension) Father    Hyperlipidemia (Elevated cholesterol) Father      Social History   Tobacco Use  Smoking Status Never  Smokeless Tobacco Never     Social History   Socioeconomic History   Marital  status: Married  Tobacco Use   Smoking status: Never   Smokeless tobacco: Never  Substance and Sexual Activity   Alcohol use: Not Currently   Drug use: Not Currently   Social Drivers of Health    Received from [employer]   Social Network    Objective:    Physical Exam   Constitutional:  WDWN in NAD, conversant, no obvious deformities; lying in bed comfortably Eyes:  Pupils equal, round; sclera anicteric; moist conjunctiva; no lid lag HENT:  Oral mucosa moist; good dentition  Neck:  No masses palpated, trachea midline; no thyromegaly Lungs:  CTA  bilaterally; normal respiratory effort Breasts:  symmetric, no nipple changes; no palpable masses or lymphadenopathy on either side except for a small hematoma and ecchymosis at the left breast biopsy site CV:  Regular rate and rhythm; no murmurs; extremities well-perfused with no edema Abd:  +bowel sounds, soft, non-tender, no palpable organomegaly; no palpable hernias Musc:  Normal gait; no apparent clubbing or cyanosis in extremities Lymphatic:  No palpable cervical or axillary lymphadenopathy Skin:  Warm, dry; no sign of jaundice Psychiatric - alert and oriented x 4; calm mood and affect   Labs, Imaging and Diagnostic Testing:  FINAL DIAGNOSIS        1. Breast, left, needle core biopsy, 2 o'clock, 9cmfn, heart clip :       - INVASIVE DUCTAL CARCINOMA, SEE NOTE       - TUBULE FORMATION: SCORE 3/3       - NUCLEAR PLEOMORPHISM: SCORE 3/3       - MITOTIC COUNT: SCORE 1/3       - TOTAL SCORE: 7/9       - OVERALL GRADE: 2/3       - LYMPHOVASCULAR INVASION: NOT IDENTIFIED       - CANCER LENGTH: 9 MM       - CALCIFICATIONS: PRESENT       - OTHER FINDINGS: AREAS OF NECROSIS ARE PRESENT    PROGNOSTIC INDICATORS   Results:  IMMUNOHISTOCHEMICAL AND MORPHOMETRIC ANALYSIS PERFORMED MANUALLY  The tumor cells are POSITIVE for Her2 (3+).  Estrogen Receptor:  80%, POSITIVE, MODERATE-STRONG STAINING INTENSITY  Progesterone Receptor:  20%, POSITIVE, MODERATE-STRONG STAINING INTENSITY  Proliferation Marker Ki67:  25%   CLINICAL DATA:  Recall from screening mammography, new calcifications involving the UPPER OUTER QUADRANT of the LEFT breast at posterior depth.   EXAM: DIGITAL DIAGNOSTIC UNILATERAL LEFT MAMMOGRAM WITH CAD; ULTRASOUND LEFT BREAST LIMITED   TECHNIQUE: Left digital diagnostic mammography was performed. ; Targeted ultrasound examination of the left breast was performed.   COMPARISON:  Previous exam(s).   ACR Breast Density Category b: There are scattered areas  of fibroglandular density.   FINDINGS: Spot magnification CC and MLO views of the calcifications and a full field mediolateral view were obtained.   There is a group of coarse heterogeneous calcifications associated with an asymmetry in the UPPER OUTER QUADRANT at posterior depth spanning approximately 3.2 x 2.2 x 2.9 cm (AP x transverse x CC). There are linear and branching forms within the large group. There is a possible mass at the posterior margin of the calcifications measuring just under 1 cm in size.   Targeted ultrasound is performed, demonstrating an irregular hypoechoic mass with angular margins and associated calcifications at 2 o'clock 9 cm from the nipple measuring approximately 2.1 x 1.6 x 1.6 cm, demonstrating posterior acoustic shadowing and demonstrating internal power Doppler flow. There is a 0.4 x 0.3 x 0.2 cm  satellite mass at 2 o'clock 7 cm from the nipple which is approximately 2 cm removed from the dominant mass.   Sonographic evaluation of the LEFT axilla demonstrates no pathologic lymphadenopathy.   IMPRESSION: 1. Highly suspicious 2.1 cm mass in the UPPER OUTER QUADRANT of the LEFT breast at 2 o'clock 9 cm from the nipple with associated highly suspicious calcifications which span 3.2 cm. 2. Satellite 0.4 cm mass at 2 o'clock 7 cm from the nipple. 3. No pathologic LEFT axillary lymphadenopathy.   RECOMMENDATION: 1. Ultrasound-guided core needle biopsy of the highly suspicious LEFT breast mass at 2 o'clock 9 cm from the nipple and the satellite mass at 2 o'clock 7 cm from the nipple. 2. Consider Breast MRI without and with contrast to confirm extent of disease and be sure that there is no noncalcified DCIS elsewhere.   I have discussed the findings and recommendations with the patient. The ultrasound core needle biopsy procedure was discussed with the patient and her questions were answered. She wishes to proceed with the biopsy which has been  scheduled by [facility] staff.   BI-RADS CATEGORY  5: Highly suggestive of malignancy.     Electronically Signed   By: Rinda Cheers M.D.   On: 08/16/2023 10:53    Assessment and Plan:  Diagnoses and all orders for this visit:  Invasive ductal carcinoma of breast, left (CMS/HHS-HCC)     Recommend bilateral breast MRI to determine the extent of the disease due to the wider area of calcifications.  The satellite nodule also needs to be evaluated.  Based on her prognostic panel, Oncology recommends neoadjuvant chemotherapy.  Once she has completed her course of treatment, she will likely need a left radioactive seed localized lumpectomy versus mastectomy with or without reconstruction.  We also discussed the possibility of risk reducing right mastectomy.  We will plan ultrasound-guided port placement in the near future.  The surgical procedure has been discussed with the patient.  Potential risks, benefits, alternative treatments, and expected outcomes have been explained.  All of the patient's questions at this time have been answered.  The likelihood of reaching the patient's treatment goal is good.  The patient understands the proposed surgical procedure and wishes to proceed.  We will discuss further surgical planning after she has completed her chemotherapy.    Gerhardt Knudsen, MD  08/24/2023 3:41 PM

## 2023-08-25 ENCOUNTER — Other Ambulatory Visit: Payer: Self-pay | Admitting: Pharmacist

## 2023-08-25 ENCOUNTER — Encounter: Payer: Self-pay | Admitting: Genetic Counselor

## 2023-08-25 ENCOUNTER — Other Ambulatory Visit: Payer: Self-pay

## 2023-08-25 ENCOUNTER — Encounter: Payer: Self-pay | Admitting: Hematology and Oncology

## 2023-08-25 DIAGNOSIS — C50412 Malignant neoplasm of upper-outer quadrant of left female breast: Secondary | ICD-10-CM

## 2023-08-25 NOTE — Progress Notes (Signed)
REFERRING PROVIDER: Rachel Moulds, MD [address] [place],  [place] [address]  PRIMARY PROVIDER:  Linus Mako, PA-C  PRIMARY REASON FOR VISIT:  1. Malignant neoplasm of upper-outer quadrant of left breast in female, estrogen receptor positive (HCC)    HISTORY OF PRESENT ILLNESS:   Martha Washington, a 58 y.o. female, was seen for a [place] cancer genetics consultation at the request of Dr. Al Pimple due to a personal history of breast cancer.  Martha Washington presents to clinic today to discuss the possibility of a hereditary predisposition to cancer, to discuss genetic testing, and to further clarify her future cancer risks, as well as potential cancer risks for family members.   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 The treatment plan includes neoadjuvant chemotherapy.   CANCER HISTORY:  Oncology History  Malignant neoplasm of upper-outer quadrant of left breast in female, estrogen receptor positive (HCC)  08/04/2023 Mammogram   Further evaluation is suggested for calcifications in the left breast.  There is a group of coarse heterogeneous calcifications associated with an asymmetry in the UPPER OUTER QUADRANT at posterior depth spanning approximately 3.2 x 2.2 x 2.9 cm (AP x transverse x CC). There are linear and branching forms within the large group. There is a possible mass at the posterior margin of the calcifications measuring just under 1 cm in size.   Targeted ultrasound is performed, demonstrating an irregular hypoechoic mass with angular margins and associated calcifications at 2 o'clock 9 cm from the nipple measuring approximately 2.1 x 1.6 x 1.6 cm, demonstrating posterior acoustic shadowing and demonstrating internal power Doppler flow. There is a 0.4 x 0.3 x 0.2 cm satellite mass at 2 o'clock 7 cm from the nipple which is approximately 2 cm removed from the dominant mass.      08/16/2023 Pathology Results     08/22/2023 Initial Diagnosis   Malignant neoplasm of upper-outer quadrant of left breast in female, estrogen receptor positive (HCC)   08/24/2023 Cancer Staging   Staging form: Breast, AJCC 8th Edition - Clinical stage from 08/24/2023: Stage IB (cT2, cN0, cM0, G2, ER+, PR+, HER2+) - Signed by Rachel Moulds, MD on 08/24/2023 Stage prefix: Initial diagnosis Histologic grading system: 3 grade system Laterality: Left Staged by: Pathologist and managing physician Stage used in treatment planning: Yes National guidelines used in treatment planning: Yes Type of national guideline used in treatment planning: NCCN   09/07/2023 -  Chemotherapy   Patient is on Treatment Plan : BREAST  Docetaxel + Carboplatin + Trastuzumab + Pertuzumab  (TCHP) q21d          Past Medical History:  Diagnosis Date   Atrial fibrillation with RVR (HCC)    GERD (gastroesophageal reflux disease)    no meds   High cholesterol    Menorrhagia 07/13/2013   Missed abortion    resolved - no surgery required   New onset atrial fibrillation (HCC) 08/04/2017   with RVR to the 160s; S/P hammertoe OR/notes 08/04/2017   S/P bunionectomy 08/04/2017   SVD (spontaneous vaginal delivery) X 2   Tachycardia 08/04/2017    Past Surgical History:  Procedure Laterality Date   ABDOMINAL HYSTERECTOMY     BACK SURGERY     BREAST BIOPSY Left 08/16/2023   [place] LT BREAST BX W LOC DEV 1ST LESION IMG BX SPEC US GUIDE 08/16/2023 [facility] MAMMOGRAPHY   BUNIONECTOMY WITH HAMMERTOE RECONSTRUCTION Right 03/2017   BUNIONECTOMY WITH  HAMMERTOE RECONSTRUCTION Left 08/04/2017   BUNIONECTOMY WITH HAMMERTOE RECONSTRUCTION Left 08/04/2017   Procedure: Left modified Adin Hector and Lapidus bunion corrections; Left 2-3 Weil and hammertoe corrections;  Surgeon: Toni Arthurs, MD;  Location: [facility];  Service: Orthopedics;  Laterality: Left;   ROBOTIC ASSISTED TOTAL HYSTERECTOMY WITH BILATERAL SALPINGO OOPHERECTOMY  N/A 07/13/2013   Procedure: ROBOTIC ASSISTED TOTAL HYSTERECTOMY WITH BILATERAL SALPINGECTOMY;  Surgeon: Esmeralda Arthur, MD;  Location: [facility];  Service: Gynecology;  Laterality: N/A;   SHOULDER ARTHROSCOPY W/ ROTATOR CUFF REPAIR Right 2010   SHOULDER ARTHROSCOPY WITH ROTATOR CUFF REPAIR Right 10/17/2014   Procedure: SHOULDER ARTHROSCOPY WITH ROTATOR CUFF REPAIR;  Surgeon: Mckinley Jewel, MD;  Location:  SURGERY CENTER;  Service: Orthopedics;  Laterality: Right;   TUBAL LIGATION     WEIL OSTEOTOMY Left 08/04/2017   Procedure: Left 2nd and 3rd weil osteotomy;  Surgeon: Toni Arthurs, MD;  Location:  SURGERY CENTER;  Service: Orthopedics;  Laterality: Left;   WISDOM TOOTH EXTRACTION      FAMILY HISTORY:  We obtained a detailed, 4-generation family history.  Significant diagnoses are listed below: Family History  Problem Relation Age of Onset   Bone cancer Brother 46       or other primary?       Ms. Ludolph thinks her brother may have had negative hereditary cancer genetic testing recently.  No reports were available for review.   There is no reported Ashkenazi Jewish ancestry. There is no known consanguinity.  GENETIC COUNSELING ASSESSMENT: Martha Washington is a 58 y.o. female with a personal history of breast cancer which is somewhat suggestive of a hereditary cancer syndrome and predisposition to cancer given her age of diagnosis. We, therefore, discussed and recommended the following at today's visit.   DISCUSSION: We discussed that 5 - 10% of cancer is hereditary.  Most cases of hereditary breast cancer are associated with mutations in BRCA1/2.  There are other genes that can be associated with hereditary breast cancer syndromes.  We discussed that testing is beneficial for several reasons including knowing how to follow individuals for their cancer risks, identifying whether potential treatment/surgery options would be beneficial, and understanding if other family  members could be at risk for cancer and allowing them to undergo genetic testing.   We reviewed the characteristics, features and inheritance patterns of hereditary cancer syndromes. We also discussed genetic testing, including the appropriate family members to test, the process of testing, insurance coverage and turn-around-time for results. We discussed the implications of a negative, positive, carrier and/or variant of uncertain significant result. We recommended Ms. Vue pursue genetic testing for a panel that includes genes associated with breast cancer and other cancers.   Ms. Coate  was offered a common hereditary cancer panel (~40 genes) and an expanded pan-cancer panel (~70 genes). Ms. Stine was informed of the benefits and limitations of each panel, including that expanded pan-cancer panels contain genes that do not have clear management guidelines at this point in time.  We also discussed that as the number of genes included on a panel increases, the chances of variants of uncertain significance increases.  After considering the benefits and limitations of each gene panel, Ms. Austria  elected to have a common hereditary cancers panel through [employer]  The [facility] CancerNext+RNAinsight Panel includes sequencing, rearrangement analysis, and RNA analysis for the following 39 genes: APC, ATM, BAP1, BARD1, BMPR1A, BRCA1, BRCA2, BRIP1, CDH1, CDKN2A, CHEK2, FH, FLCN, MET, MLH1, MSH2, MSH6, MUTYH, NF1, NTHL1,  PALB2, PMS2, PTEN, RAD51C, RAD51D, SMAD4, STK11, TP53, TSC1, TSC2, and VHL (sequencing and deletion/duplication); AXIN2, HOXB13, MBD4, MSH3, POLD1 and POLE (sequencing only); EPCAM and GREM1 (deletion/duplication only).  Based on Ms. Vora's personal history of breast cancer, she meets American Society of Breast Surgeon criteria for genetic testing.  She does not meet NCCN criteria at this time; however, genetic testing is reasonable to consider, especially given the number  of males v females in the family; thus the family history may be uninformative. We discussed that she may have an out of pocket cost. We discussed that if her out of pocket cost for testing is over $100, the laboratory should contact her and discuss the self-pay prices and/or patient pay assistance programs.    PLAN: After considering the risks, benefits, and limitations, Ms. Raymon provided informed consent to pursue genetic testing and the blood sample was sent to [facility] for analysis of the CancerNext+RNAinsight Panel. Results should be available within approximately  3 weeks, at which point they will be disclosed by telephone to Ms. Moga, as will any additional recommendations warranted by these results. Ms. Devoss will receive a summary of her genetic counseling visit and a copy of her results once available. This information will also be available in Epic.   Ms. Matos questions were answered to her satisfaction today. Our contact information was provided should additional questions or concerns arise. Thank you for the referral and allowing [place] to share in the care of your patient.   Domique Clapper M. Rennie Plowman, MS, [facility] Genetic Counselor [email or web address] (P) [phone]   30 minutes were spent on the date of the encounter in service to the patient including preparation, face-to-face consultation, documentation and care coordination.  The patient was accompanied by her husband.  Dr. Al Pimple was available to discuss this case as needed.    _______________________________________________________________________ For Office Staff:  Number of people involved in session: 2 Was an Intern/ student involved with case: no

## 2023-08-26 ENCOUNTER — Telehealth: Payer: Self-pay | Admitting: Hematology and Oncology

## 2023-08-26 ENCOUNTER — Other Ambulatory Visit: Payer: Self-pay

## 2023-08-26 NOTE — Telephone Encounter (Signed)
Spoke with patient confirming upcoming appointments  

## 2023-08-28 ENCOUNTER — Other Ambulatory Visit: Payer: Self-pay

## 2023-08-29 ENCOUNTER — Encounter (HOSPITAL_BASED_OUTPATIENT_CLINIC_OR_DEPARTMENT_OTHER): Payer: Self-pay | Admitting: Surgery

## 2023-08-29 ENCOUNTER — Other Ambulatory Visit: Payer: Self-pay

## 2023-08-29 ENCOUNTER — Telehealth: Payer: Self-pay | Admitting: *Deleted

## 2023-08-29 NOTE — Progress Notes (Addendum)
   08/29/23 1255  PAT Phone Screen  Is the patient taking a GLP-1 receptor agonist? (S)  No (has previously taken Zepbound- has since discontinued)  Do You Have Diabetes? No  Do You Have Hypertension? No  Have You Ever Been to the ER for Asthma? No  Have You Taken Oral Steroids in the Past 3 Months? No  Do you Take Phenteramine or any Other Diet Drugs? No  Recent  Lab Work, EKG, CXR? Yes  Where was this test performed? 08/24/23 [facility]  Do you have a history of heart problems? Yes  Cardiologist Name (S)  Dr Servando Salina- last OV 04/16/21. Pt has hx of afib w/ rvr following surgery @[facility]  08/04/2017 requiring transfer to hospital for observation.Cardiac clearance requested when history reviewed with Dr Bradley Ferris.  Have you ever had tests on your heart? (S)  Yes  What cardiac tests were performed? (S)  Echo;EKG;Other (comment)  What date/year were cardiac tests completed? (S)  Last Echo 04/29/21 EF 55-60%. Echo scheduled 08/30/23 by oncology. Xio monitor 05/12/21 rare psvt found.  Results viewable: (S)  [facility] Media Tab  Any Recent Hospitalizations? No  Height 5\' 4"  (1.626 m)  Weight 88 kg  Pat Appointment Scheduled (S)  Yes (Pt will need to come in for EKG unless done during visit for cardiac clearance.)   I spoke with Toniann Fail RN at [facility] and she will request cardiac clearance.  Clearance received and placed on chart in Pre op- 09/02/23 10:35 am

## 2023-08-29 NOTE — Telephone Encounter (Signed)
[employer] 2021-05 - Specimen Collection Study to Evaluate Biomarkers in Subjects with Cancer   Spoke with patient regarding the above study and she states she is interested in participating.  She does not have any questions at this time and agreed to meet with research nurse after her chemo education appointment on Wednesday 08/31/23 to review consent. Plan for blood collection at lab appointment scheduled for 09/06/23 prior to starting treatment.  Asked patient to call research nurse if any questions before next appointment. She verbalized understanding. Domenica Reamer, BSN, RN, [employer] Research Nurse II [phone] 08/29/2023 2:15 PM

## 2023-08-29 NOTE — Telephone Encounter (Signed)
W2956, ICE COMPRESS: RANDOMIZED TRIAL OF LIMB CRYOCOMPRESSION VERSUS CONTINUOUS COMPRESSION VERSUS LOW CYCLIC COMPRESSION FOR THE PREVENTION  OF TAXANE-INDUCED PERIPHERAL NEUROPATHY  Spoke with patient regarding above study. Discussed that she is planning on using the Dignicap device during chemotherapy. Informed patient that she would be connected to the dignicap and Paxman cooling/compression device if she participates in this study.  Informed patient it is possible to use both devices at once if she wants to enroll on this study. Patient thought about it and decided she does not want to have 2 devices and will not participate in the study since she does want to use the Dignicap.  Thanked patient for her time and willingness to discuss this study.  Dr. Al Pimple notified.  Domenica Reamer, BSN, RN, [employer] Research Nurse II [phone] 08/29/2023 2:08 PM

## 2023-08-30 ENCOUNTER — Telehealth: Payer: Self-pay | Admitting: Cardiology

## 2023-08-30 ENCOUNTER — Ambulatory Visit (HOSPITAL_COMMUNITY)
Admission: RE | Admit: 2023-08-30 | Discharge: 2023-08-30 | Disposition: A | Discharge status: Home / Self Care | Payer: 59 | Source: Ambulatory Visit | Attending: Hematology and Oncology | Admitting: Hematology and Oncology

## 2023-08-30 DIAGNOSIS — Z170 Estrogen receptor positive status [ER+]: Secondary | ICD-10-CM | POA: Diagnosis not present

## 2023-08-30 DIAGNOSIS — I358 Other nonrheumatic aortic valve disorders: Secondary | ICD-10-CM | POA: Insufficient documentation

## 2023-08-30 DIAGNOSIS — C50412 Malignant neoplasm of upper-outer quadrant of left female breast: Secondary | ICD-10-CM | POA: Diagnosis not present

## 2023-08-30 DIAGNOSIS — Z01818 Encounter for other preprocedural examination: Secondary | ICD-10-CM | POA: Insufficient documentation

## 2023-08-30 LAB — ECHOCARDIOGRAM COMPLETE
Area-P 1/2: 2.85 cm2
Calc EF: 64.7 %
S' Lateral: 3.1 cm
Single Plane A2C EF: 63.9 %
Single Plane A4C EF: 64.7 %

## 2023-08-30 NOTE — Telephone Encounter (Signed)
   Name: Martha Washington  DOB: 1966-03-16  MRN: [id number]  Primary Cardiologist: Thomasene Ripple, DO  Chart reviewed as part of pre-operative protocol coverage. Because of ZOEH INZER past medical history and time since last visit, she will require a follow-up in-office visit in order to better assess preoperative cardiovascular risk. Pt has not been seen since 2022.   Pre-op covering staff: - Please schedule appointment and call patient to inform them. If patient already had an upcoming appointment within acceptable timeframe, please add "pre-op clearance" to the appointment notes so provider is aware. - Please contact requesting surgeon's office via preferred method (i.e, phone, fax) to inform them of need for appointment prior to surgery.   Joylene Grapes, NP  08/30/2023, 9:46 AM

## 2023-08-30 NOTE — Telephone Encounter (Signed)
S/w Dr. Fatima Sanger office as to update the pt is going to need an in office appt as last seen 2022.

## 2023-08-30 NOTE — Telephone Encounter (Signed)
Left message for pt to call back ASAP as she is needing pre op clearance. Pt has procedure 09/06/23. I will update the surgeon office as well.

## 2023-08-30 NOTE — Telephone Encounter (Signed)
Pt has appt with Martha Washington, [facility] 08/31/23 at 9:15. I will update all parties involved.

## 2023-08-30 NOTE — Telephone Encounter (Signed)
   Pre-operative Risk Assessment    Patient Name: Martha Washington  DOB: 09-24-1965 MRN: [id number]      Request for Surgical Clearance    Procedure:   Portacath Placement  Date of Surgery:  Clearance 09/06/23                                 Surgeon:  Dr Manus Rudd Surgeon's Group or Practice Name:  [facility]  Phone number:  [phone] Fax number:  [phone]   Type of Clearance Requested:   - Medical    Type of Anesthesia:  General    Additional requests/questions:    SignedAndreas Blower   08/30/2023, 9:37 AM

## 2023-08-31 ENCOUNTER — Inpatient Hospital Stay: Payer: 59 | Admitting: Pharmacist

## 2023-08-31 ENCOUNTER — Inpatient Hospital Stay: Payer: 59

## 2023-08-31 ENCOUNTER — Ambulatory Visit
Admission: RE | Admit: 2023-08-31 | Discharge: 2023-08-31 | Disposition: A | Discharge status: Home / Self Care | Payer: 59 | Source: Ambulatory Visit | Attending: Hematology and Oncology | Admitting: Hematology and Oncology

## 2023-08-31 ENCOUNTER — Telehealth: Payer: Self-pay | Admitting: Hematology and Oncology

## 2023-08-31 ENCOUNTER — Encounter: Payer: Self-pay | Admitting: *Deleted

## 2023-08-31 ENCOUNTER — Ambulatory Visit: Payer: 59 | Admitting: Physician Assistant

## 2023-08-31 DIAGNOSIS — Z170 Estrogen receptor positive status [ER+]: Secondary | ICD-10-CM

## 2023-08-31 DIAGNOSIS — C50412 Malignant neoplasm of upper-outer quadrant of left female breast: Secondary | ICD-10-CM | POA: Diagnosis not present

## 2023-08-31 MED ORDER — LIDOCAINE-PRILOCAINE 2.5-2.5 % EX CREA: TOPICAL_CREAM | CUTANEOUS | 3 refills | Status: DC

## 2023-08-31 MED ORDER — ONDANSETRON HCL 8 MG PO TABS: 8.0000 mg | ORAL_TABLET | Freq: Three times a day (TID) | ORAL | 1 refills | Status: DC | PRN

## 2023-08-31 MED ORDER — PROCHLORPERAZINE MALEATE 10 MG PO TABS: 10.0000 mg | ORAL_TABLET | Freq: Four times a day (QID) | ORAL | 1 refills | Status: DC | PRN

## 2023-08-31 MED ORDER — DEXAMETHASONE 4 MG PO TABS: ORAL_TABLET | ORAL | 1 refills | Status: DC

## 2023-08-31 MED ORDER — GADOPICLENOL 0.5 MMOL/ML IV SOLN
9.0000 mL | Freq: Once | INTRAVENOUS | Status: AC | PRN
  Administered 2023-08-31: 9 mL via INTRAVENOUS

## 2023-08-31 NOTE — Telephone Encounter (Signed)
Pt had a scheduling issue due to her not leaving her appointment on 09/06/23 to get her port placed at [facility].Pt was told she would not leave [place] until 1130.This was relayed By Duanne Guess RN Chemo educator to see if appointments can be changed. Discussed with Dr Al Pimple appointment times changed. Pt will see [place] at 12 and have labs drawn from [facility] after her appointment with Dr Al Pimple.

## 2023-08-31 NOTE — Progress Notes (Signed)
[facility]       Telephone: [phone]?Fax: [phone]   Oncology Clinical Pharmacist Practitioner Initial Assessment  Martha Washington is a 58 y.o. female with a diagnosis of breast cancer. They were contacted today via in-person visit.  Indication/Regimen TCHP: Trastuzumab (Herceptin) and pertuzumab (Perjeta) and docetaxel (Taxotere) and carboplatin (Paraplatin) are being used appropriately for treatment of breast cancer by Dr. Rachel Washington.      Wt Readings from Last 1 Encounters:  08/24/23 193 lb 14.4 oz (88 kg)    Estimated body surface area is 1.99 meters squared as calculated from the following:   Height as of 08/29/23: 5\' 4"  (1.626 m).   Weight as of 08/29/23: 194 lb 0.1 oz (88 kg).  The dosing regimen is every 21 days for 6 cycles  Trastuzumab (8 mg/kg load, 6 mg/kg maintenance) on Day 1 Pertuzumab (840 mg load, 420 mg maintenance) on Day 1 Docetaxel (75 mg/m2) on Day 1 Carboplatin (AUC 6) on Day 1 Pegfilgrastim (6 mg) on Day 3  Dose Modifications none   Allergies Allergies  Allergen Reactions   Azithromycin     Stomach Cramps   Morphine And Codeine Nausea And Vomiting   Oxycodone     "passed out when taking after last surgery"    Vitals: No vitals or labs were done today for this chemotherapy education visit     08/29/2023   12:55 PM 08/24/2023   12:26 PM 04/15/2021    3:58 PM  Oncology Vitals  Height 163 cm  163 cm  Weight 88 kg 87.952 kg 100.263 kg  Weight (lbs) 194 lbs 193 lbs 14 oz 221 lbs 1 oz  BMI 33.3 kg/m2 33.28 kg/m2 37.94 kg/m2  Temp  97.8 F (36.6 C)   Pulse Rate  64   BP  120/65   Resp  16   SpO2  100 % 93 %  BSA (m2) 1.99 m2 1.99 m2 2.13 m2     Laboratory Data    Latest Ref Rng & Units 08/24/2023   11:57 AM 08/24/2018    8:37 AM 08/04/2017    1:17 PM  CBC EXTENDED  WBC 4.0 - 10.5 K/uL 6.1  4.8  7.5   RBC 3.87 - 5.11 MIL/uL 4.91  4.97  4.99   Hemoglobin 12.0 - 15.0 g/dL 65.7  84.6  96.2   HCT 36.0 -  46.0 % 41.3  44.4  43.1   Platelets 150 - 400 K/uL 244  260  227   NEUT# 1.7 - 7.7 K/uL 4.5  2.6  6.9   Lymph# 0.7 - 4.0 K/uL 1.2  1.5  0.5        Latest Ref Rng & Units 08/24/2023   11:57 AM 08/24/2018    8:37 AM 08/04/2017    1:17 PM  CMP  Glucose 70 - 99 mg/dL 85  73  952   BUN 6 - 20 mg/dL 14  13  9    Creatinine 0.44 - 1.00 mg/dL 8.41  3.24  4.01   Sodium 135 - 145 mmol/L 140  141  139   Potassium 3.5 - 5.1 mmol/L 4.0  4.1  3.8   Chloride 98 - 111 mmol/L 104  110  105   CO2 22 - 32 mmol/L 30  20  26    Calcium 8.9 - 10.3 mg/dL 9.5  9.6  9.3   Total Protein 6.5 - 8.1 g/dL 6.8   6.8   Total Bilirubin 0.0 - 1.2 mg/dL  0.7   0.6   Alkaline Phos 38 - 126 U/L 58   58   AST 15 - 41 U/L 21   27   ALT 0 - 44 U/L 19   17    Contraindications Contraindications were reviewed? Yes Contraindications to therapy were identified? No   Safety Precautions (written information also provided) The following safety precautions for the use of TCHP were reviewed:  Decreased hemoglobin, part of the red blood cells that carry iron and oxygen Decreased platelet count and increased risk of bleeding Decreased white blood cells (WBCs) and increased risk for infection Fever: reviewed the importance of having a thermometer and the Centers for Disease Control and Prevention (CDC) definition of fever which is 100.51F (38C) or higher. Patient should call 24/7 triage at [phone] if experiencing a fever or any other symptoms Hair Loss Muscle or joint pain or weakness Fatigue Nausea or vomiting Mouth sores Diarrhea Irregular menses (if applicable) Peripheral neuropathy: numbness or tingling in hands and feet Fluid retention or swelling (edema) Nail Changes Rash or itchy skin Fluid retention or swelling (edema) Taste changes Changes in kidney function Changes in electrolytes and other laboratory values (low potassium, low magnesium) Cardiotoxicity from trastuzumab Infusion reactions Pneumonitis  from trastuzumab Docetaxel irritating veins Liver toxicity from docetaxel Docetaxel can cause eye pain, blurred vision, tearing, and light sensitivity Handling body fluids and waste Intimacy, sexual activity, contraception, fertility  Medication Reconciliation Current Outpatient Medications  Medication Sig Dispense Refill   Ascorbic Acid (VITAMIN C) 1000 MG tablet Take 1,000 mg by mouth daily.     b complex vitamins capsule Take 1 capsule by mouth daily.     escitalopram (LEXAPRO) 10 MG tablet Take 1 tablet (10 mg total) by mouth daily. 90 tablet 0   VITAMIN D PO Take by mouth.     ZINC CITRATE PO Take by mouth.     dexamethasone (DECADRON) 4 MG tablet Take 2 tabs by mouth 2 times daily starting day before chemo. Then take 2 tabs daily for 2 days starting day after chemo. Take with food. 30 tablet 1   lidocaine-prilocaine (EMLA) cream Apply to affected area once 30 g 3   ondansetron (ZOFRAN) 8 MG tablet Take 1 tablet (8 mg total) by mouth every 8 (eight) hours as needed for nausea or vomiting. Start on the third day after chemotherapy. 30 tablet 1   prochlorperazine (COMPAZINE) 10 MG tablet Take 1 tablet (10 mg total) by mouth every 6 (six) hours as needed for nausea or vomiting. 30 tablet 1   No current facility-administered medications for this visit.    Medication reconciliation is based on the patient's most recent medication list in the electronic medical record (EMR) including herbal products and OTC medications.   The patient's medication list was reviewed today with the patient? Yes   Drug-drug interactions (DDIs) DDIs were evaluated? Yes Significant DDIs identified? No   Drug-Food Interactions Drug-food interactions were evaluated? Yes Drug-food interactions identified? No   Follow-up Plan  Treatment start date: 09/07/23 Port placement date: 09/06/23 ECHO date: 08/30/23 We reviewed the prescriptions, premedications, and treatment regimen with the patient. Possible side  effects of the treatment regimen were reviewed and management strategies were discussed. Can use loperamide as needed for diarrhea, loratadine as needed for G-CSF bone pain, and Senna-S as needed for constipation.  American Cancer Society article discussing antioxidants and risk for chemotherapy not working as well: https://www.cancer.org/cancer/latest-news/study-finds-antioxidants-risky-during-breast-cancer-chemotherapy.html  Clinical pharmacy will assist Dr. Rachel Washington and Martha Shams  Washington on an as needed basis going forward  Martha Washington participated in the discussion, expressed understanding, and voiced agreement with the above plan. All questions were answered to her satisfaction. The patient was advised to contact the clinic at [phone] with any questions or concerns prior to her return visit.   I spent 60 minutes assessing the patient.  Daemian Gahm A. Odetta Pink, PharmD, BCOP, CPP  Anselm Lis, RPH-CPP, 08/31/2023 2:55 PM  **Disclaimer: This note was dictated with voice recognition software. Similar sounding words can inadvertently be transcribed and this note may contain transcription errors which may not have been corrected upon publication of note.**

## 2023-08-31 NOTE — Progress Notes (Deleted)
Cardiology Office Note:    Date:  08/31/2023  ID:  Martha Washington, DOB 1965/11/28, MRN [id number] PCP: Lind Covert, MD  [facility] Providers Cardiologist:  Thomasene Ripple, DO { Click to update primary MD,subspecialty MD or APP then REFRESH:1}    {Click to Open Review  :1}   Patient Profile:      Martha Washington. Wickenhauser is a 58 year old female with history of postop atrial fibrillation, hyperlipidemia.  She established with cardiology on 04/15/2021 with Dr. Servando Salina.  She was referred to cardiology at the time to be evaluated for syncope.  She had saw neurology and had a negative MRI and EEG workup.  Echocardiogram performed September 2022 showing LVEF 55-60%, no RWMA, RV SF normal, no valvular abnormalities.  Heart monitor performed September 2022 showed predominant rhythm was sinus rhythm, 2 supraventricular tachycardia runs occurred, the run with the fastest interval lasting 4 beats with a max heart rate 146 bpm, the longest lasting 6 beats with an average rate of 124 bpm.  She had a CT cardiac scoring exam on 08/04/2023 showing a coronary calcium score of 0.  Recent echocardiogram performed on 08/30/2023 was normal.      History of Present Illness:  Discussed the use of AI scribe software for clinical note transcription with the patient, who gave verbal consent to proceed.  Martha Washington is a 58 y.o. female who returns for ***Discussed the use of AI scribe software for clinical note transcription with the patient, who gave verbal consent to proceed.  She has a Port-A-Cath placement with [facility] surgery by Dr. Corliss Skains on 09/06/2023.  History of Present Illness            ROS   See HPI ***     Home Medications:    Prior to Admission medications   Medication Sig Start Date End Date Taking? Authorizing Provider  Ascorbic Acid (VITAMIN C) 1000 MG tablet Take 1,000 mg by mouth daily.    [provider]  b complex vitamins capsule Take 1 capsule by mouth daily.     [provider]  escitalopram (LEXAPRO) 10 MG tablet Take 1 tablet (10 mg total) by mouth daily. 08/21/23     VITAMIN D PO Take by mouth.    [provider]  ZINC CITRATE PO Take by mouth.    [provider]   Studies Reviewed:       *** Risk Assessment/Calculations:   {Does this patient have ATRIAL FIBRILLATION?:[id number]} No BP recorded.  {Refresh Note OR Click here to enter BP  :1}***       Physical Exam:   VS:  LMP 07/05/2013    Wt Readings from Last 3 Encounters:  08/24/23 193 lb 14.4 oz (88 kg)  04/15/21 221 lb 0.6 oz (100.3 kg)  03/05/21 218 lb 3.2 oz (99 kg)    Physical Exam***     Assessment and Plan:  Assessment and Plan              {Are you ordering a CV Procedure (e.g. stress test, cath, DCCV, TEE, etc)?   Press F2        :[id number]}  Dispo:  No follow-ups on file.  Signed, Denyce Robert, NP        08/31/2023, 5:26 AM [facility] [address] Office [phone] Fax [phone]    I spent***minutes examining this patient, reviewing medications, and using patient centered shared decision making involving their cardiac care.  I spent greater than 20 minutes reviewing their past medical history,  medications, and prior cardiac tests.

## 2023-08-31 NOTE — Research (Signed)
Trial Name:  [employer] 2021-05 - Specimen Collection Study to Evaluate Biomarkers in Subjects with Cancer    Patient Martha Washington was identified by Dr. Al Pimple as a potential candidate for the above listed study.  This Clinical Research Nurse met with Martha Washington, Martha Washington on 08/31/23 in a manner and location that ensures patient privacy to discuss participation in the above listed research study.  Patient is Accompanied by husband and daughter .  Patient was previously provided with informed consent documents.  Patient confirmed they have read the informed consent documents.  As outlined in the informed consent form, this Nurse and Martha Washington discussed the purpose of the research study, the investigational nature of the study, study procedures and requirements for study participation, potential risks and benefits of study participation, as well as alternatives to participation.  This study is not blinded or double-blinded. The patient understands participation is voluntary and they may withdraw from study participation at any time.  This study does not involve randomization.  This study does not involve an investigational drug or device. This study does not involve a placebo. Patient understands enrollment is pending full eligibility review.   Confidentiality and how the patient's information will be used as part of study participation were discussed.  Patient was informed there is not reimbursement provided for their time and effort spent on trial participation.  The patient is encouraged to discuss research study participation with their insurance provider to determine what costs they may incur as part of study participation, including research related injury.    All questions were answered to patient's satisfaction.  The informed consent with embedded HIPAA language was reviewed page by page.  The patient's mental and emotional status is appropriate to provide informed consent,  and the patient verbalizes an understanding of study participation.  Patient has agreed to participate in the above listed research study and has voluntarily signed the informed consent version IRB approved 22 Aug 2020, revised 07 Sep 2021 with embedded HIPAA language, on 08/31/23 at 4:40 PM.  The patient did not want to wait for a copy of the signed informed consent form today and stated okay for research to give to her at next appointment. No study specific procedures were obtained prior to the signing of the informed consent document.  Approximately 15 minutes were spent with the patient reviewing the informed consent documents.  Patient was not requested to complete a Release of Information form.   Data Collection: Patient was interviewed to collect the following information.  Medical History:  High Blood Pressure  No Coronary Artery Disease No Lupus    No Rheumatoid Arthritis  No Diabetes   No      Lynch Syndrome  No  Is the patient currently taking a magnesium supplement?   No  Does the patient have a personal history of cancer (greater than 5 years ago)?  No  Does the patient have a family history of cancer in 1st or 2nd degree relatives? Yes If yes, Relationship(s) and Cancer type(s)? Brother with Bone Cancer  Does the patient have history of alcohol consumption? No    Does the patient have history of cigarette, cigar, pipe, or chewing tobacco use?  No   Plan: Blood will be collected at lab appointment 09/06/23 prior to chemotherapy. Patient was thanked for their participation in this study and encouraged to contact research nurse if any questions prior to next appointment.  Patient verbalized understanding.   Domenica Reamer, BSN, Charity fundraiser, [employer]  Clinical Research Nurse II [phone] 08/31/2023 4:39 PM

## 2023-09-01 ENCOUNTER — Telehealth: Payer: Self-pay | Admitting: *Deleted

## 2023-09-01 ENCOUNTER — Other Ambulatory Visit: Payer: Self-pay | Admitting: *Deleted

## 2023-09-01 ENCOUNTER — Encounter: Payer: Self-pay | Admitting: *Deleted

## 2023-09-01 ENCOUNTER — Other Ambulatory Visit: Payer: Self-pay | Admitting: Hematology and Oncology

## 2023-09-01 DIAGNOSIS — C50412 Malignant neoplasm of upper-outer quadrant of left female breast: Secondary | ICD-10-CM

## 2023-09-01 NOTE — Telephone Encounter (Signed)
Spoke with patient to follow up from [facility] 1/15 and assess navigation needs.  Patient is feeling very overwhelmed after having her MRI and chemo education yesterday.  She states she was at the cancer for 3 hours.  She did tell me she does not want to do the dignicap and so I will let the scheduling team know to adjust the time.  Gave support and encouraged her to call should she have any further questions or concerns.

## 2023-09-01 NOTE — Progress Notes (Signed)
Cardiology Clinic Note   Patient Name: Martha Washington Date of Encounter: 09/02/2023  Primary Care Provider:  Lind Covert, MD Primary Cardiologist:  Thomasene Ripple, DO  Patient Profile    Martha Washington 58 year old female presents to the clinic today for follow-up evaluation of her atrial fibrillation and preoperative cardiac evaluation.  Past Medical History    Past Medical History:  Diagnosis Date   Atrial fibrillation with RVR (HCC)    GERD (gastroesophageal reflux disease)    no meds   High cholesterol    Menorrhagia 07/13/2013   Missed abortion    resolved - no surgery required   New onset atrial fibrillation (HCC) 08/04/2017   with RVR to the 160s; S/P hammertoe OR/notes 08/04/2017   S/P bunionectomy 08/04/2017   SVD (spontaneous vaginal delivery) X 2   Tachycardia 08/04/2017   Past Surgical History:  Procedure Laterality Date   ABDOMINAL HYSTERECTOMY     BACK SURGERY     BREAST BIOPSY Left 08/16/2023   [place] LT BREAST BX W LOC DEV 1ST LESION IMG BX SPEC US GUIDE 08/16/2023 [facility]   BUNIONECTOMY WITH HAMMERTOE RECONSTRUCTION Right 03/2017   BUNIONECTOMY WITH HAMMERTOE RECONSTRUCTION Left 08/04/2017   BUNIONECTOMY WITH HAMMERTOE RECONSTRUCTION Left 08/04/2017   Procedure: Left modified McBride and Lapidus bunion corrections; Left 2-3 Weil and hammertoe corrections;  Surgeon: Toni Arthurs, MD;  Location: [facility];  Service: Orthopedics;  Laterality: Left;   ROBOTIC ASSISTED TOTAL HYSTERECTOMY WITH BILATERAL SALPINGO OOPHERECTOMY N/A 07/13/2013   Procedure: ROBOTIC ASSISTED TOTAL HYSTERECTOMY WITH BILATERAL SALPINGECTOMY;  Surgeon: Esmeralda Arthur, MD;  Location: [facility];  Service: Gynecology;  Laterality: N/A;   SHOULDER ARTHROSCOPY W/ ROTATOR CUFF REPAIR Right 2010   SHOULDER ARTHROSCOPY WITH ROTATOR CUFF REPAIR Right 10/17/2014   Procedure: SHOULDER ARTHROSCOPY WITH ROTATOR CUFF REPAIR;  Surgeon: Mckinley Jewel, MD;  Location: [facility];  Service: Orthopedics;  Laterality: Right;   TUBAL LIGATION     WEIL OSTEOTOMY Left 08/04/2017   Procedure: Left 2nd and 3rd weil osteotomy;  Surgeon: Toni Arthurs, MD;  Location:  SURGERY CENTER;  Service: Orthopedics;  Laterality: Left;   WISDOM TOOTH EXTRACTION      Allergies  Allergies  Allergen Reactions   Azithromycin     Stomach Cramps   Morphine And Codeine Nausea And Vomiting   Oxycodone     "passed out when taking after last surgery"    History of Present Illness    Martha Washington has a PMH of tachycardia, GERD, degenerative spondylolithiasis, tachycardia, syncope, hyperlipidemia, and left breast malignant neoplasm.  She was noted to have postoperative atrial fibrillation several years ago.  She was seen in follow-up by Dr. Servando Salina 04/15/2021 for evaluation of syncope.  She reported that 4/22 she was experiencing significant nausea after she left a cookout.  She subsequently became sick to her stomach and vomited.  She passed out during the episode.  When she woke up she realized that she had vomited again during her episode of unconsciousness.  She had bitten her tongue during the episode.  She followed up with her PCP who referred to neurology for suspicion of seizure.  She underwent MRI and EEG.  Results of both were normal.  Neurology recommended that she follow-up with cardiology.  She denied further episodes of syncope since her event in April 2022.  Cardiac event monitor and echocardiogram were ordered.  Her echocardiogram 9/22 showed normal LVEF and normal diastolic parameters.  No valvular abnormalities were noted.  No atrial shunting was detected.  Her cardiac event monitor 9/22 showed rare episodes of SVT, and predominantly normal sinus rhythm.  She presents to the clinic today for follow-up evaluation and preoperative cardiac evaluation.  She states she has been walking on the treadmill 30 minutes/day.  We reviewed her previous episode of atrial  fibrillation.  She denies subsequent episodes of irregular or accelerated heartbeat.  We reviewed her upcoming surgery.  I will plan follow-up in 12 months..  Today she denies chest pain, shortness of breath, lower extremity edema, fatigue, palpitations, melena, hematuria, hemoptysis, diaphoresis, weakness, presyncope, syncope, orthopnea, and PND.     Home Medications    Prior to Admission medications   Medication Sig Start Date End Date Taking? Authorizing Provider  Ascorbic Acid (VITAMIN C) 1000 MG tablet Take 1,000 mg by mouth daily.    [provider]  b complex vitamins capsule Take 1 capsule by mouth daily.    [provider]  dexamethasone (DECADRON) 4 MG tablet Take 2 tabs by mouth 2 times daily starting day before chemo. Then take 2 tabs daily for 2 days starting day after chemo. Take with food. 08/31/23   Rachel Moulds, MD  escitalopram (LEXAPRO) 10 MG tablet Take 1 tablet (10 mg total) by mouth daily. 08/21/23     lidocaine-prilocaine (EMLA) cream Apply to affected area once 08/31/23   Rachel Moulds, MD  ondansetron (ZOFRAN) 8 MG tablet Take 1 tablet (8 mg total) by mouth every 8 (eight) hours as needed for nausea or vomiting. Start on the third day after chemotherapy. 08/31/23   Rachel Moulds, MD  prochlorperazine (COMPAZINE) 10 MG tablet Take 1 tablet (10 mg total) by mouth every 6 (six) hours as needed for nausea or vomiting. 08/31/23   Rachel Moulds, MD  VITAMIN D PO Take by mouth.    [provider]  ZINC CITRATE PO Take by mouth.    [provider]    Family History    Family History  Problem Relation Age of Onset   Hypertension Mother    Hypertension Father    Bone cancer Brother 59       or other primary?   She indicated that her mother is alive. She indicated that her father is deceased. She indicated that the status of her brother is unknown.  Social History    Social History   Socioeconomic History   Marital status:  Married    Spouse name: Loraine Leriche   Number of children: Not on file   Years of education: Not on file   Highest education level: Not on file  Occupational History   Not on file  Tobacco Use   Smoking status: Never   Smokeless tobacco: Never  Vaping Use   Vaping status: Never Used  Substance and Sexual Activity   Alcohol use: No   Drug use: No   Sexual activity: Not on file  Other Topics Concern   Not on file  Social History Narrative   Lives with husband   Right Handed   Drinks 1 cup caffeine daily   Social Drivers of Corporate investment banker Strain: Not on file  Food Insecurity: Not on file  Transportation Needs: Not on file  Physical Activity: Not on file  Stress: Not on file  Social Connections: Unknown (12/22/2021)   Received from [facility]   Social Network    Social Network: Not on file  Intimate Partner Violence:  Unknown (11/13/2021)   Received from [facility], [facility]   HITS    Physically Hurt: Not on file    Insult or Talk Down To: Not on file    Threaten Physical Harm: Not on file    Scream or Curse: Not on file     Review of Systems    General:  No chills, fever, night sweats or weight changes.  Cardiovascular:  No chest pain, dyspnea on exertion, edema, orthopnea, palpitations, paroxysmal nocturnal dyspnea. Dermatological: No rash, lesions/masses Respiratory: No cough, dyspnea Urologic: No hematuria, dysuria Abdominal:   No nausea, vomiting, diarrhea, bright red blood per rectum, melena, or hematemesis Neurologic:  No visual changes, wkns, changes in mental status. All other systems reviewed and are otherwise negative except as noted above.  Physical Exam    VS:  BP 100/70   Pulse 61   Ht 5\' 4"  (1.626 m)   Wt 191 lb (86.6 kg)   LMP 07/05/2013   SpO2 99%   BMI 32.79 kg/m  , BMI Body mass index is 32.79 kg/m. GEN: Well nourished, well developed, in no acute distress. HEENT: normal. Neck: Supple, no JVD, carotid bruits,  or masses. Cardiac: RRR, no murmurs, rubs, or gallops. No clubbing, cyanosis, edema.  Radials/DP/PT 2+ and equal bilaterally.  Respiratory:  Respirations regular and unlabored, clear to auscultation bilaterally. GI: Soft, nontender, nondistended, BS + x 4. MS: no deformity or atrophy. Skin: warm and dry, no rash. Neuro:  Strength and sensation are intact. Psych: Normal affect.  Accessory Clinical Findings    Recent Labs: 08/24/2023: ALT 19; BUN 14; Creatinine 0.69; Hemoglobin 13.5; Platelet Count 244; Potassium 4.0; Sodium 140   Recent Lipid Panel No results found for: "CHOL", "TRIG", "HDL", "CHOLHDL", "VLDL", "LDLCALC", "LDLDIRECT"       ECG personally reviewed by me today- EKG Interpretation Date/Time:  Friday September 02 2023 08:56:10 EST Ventricular Rate:  61 PR Interval:  186 QRS Duration:  96 QT Interval:  408 QTC Calculation: 410 R Axis:   21  Text Interpretation: Normal sinus rhythm Normal ECG When compared with ECG of 24-Aug-2018 08:56, No significant change was found Confirmed by Edd Fabian [phone]) on 09/02/2023 9:01:26 AM    Echocardiogram 04/29/2021 IMPRESSIONS   1. Left ventricular ejection fraction, by estimation, is 55 to 60%. The left ventricle has normal function. The left ventricle has no regional wall motion abnormalities. Left ventricular diastolic parameters were normal. 2. Right ventricular systolic function is normal. The right ventricular size is normal. 3. The mitral valve is normal in structure. No evidence of mitral valve regurgitation. No evidence of mitral stenosis. 4. The aortic valve is normal in structure. Aortic valve regurgitation is not visualized. No aortic stenosis is present. 5. The inferior vena cava is normal in size with greater than 50% respiratory variability, suggesting right atrial pressure of 3 mmHg.  Comparison(s): LVEF 50-55%.  FINDINGS Left Ventricle: Left ventricular ejection fraction, by estimation, is 55 to 60%. The left  ventricle has normal function. The left ventricle has no regional wall motion abnormalities. The left ventricular internal cavity size was normal in size. There is no left ventricular hypertrophy. Left ventricular diastolic parameters were normal.  Right Ventricle: The right ventricular size is normal. No increase in right ventricular wall thickness. Right ventricular systolic function is normal.  Left Atrium: Left atrial size was normal in size.  Right Atrium: Right atrial size was normal in size.  Pericardium: There is no evidence of pericardial effusion.  Mitral Valve:  The mitral valve is normal in structure. No evidence of mitral valve regurgitation. No evidence of mitral valve stenosis.  Tricuspid Valve: The tricuspid valve is normal in structure. Tricuspid valve regurgitation is mild . No evidence of tricuspid stenosis.  Aortic Valve: The aortic valve is normal in structure. Aortic valve regurgitation is not visualized. No aortic stenosis is present. Aortic valve mean gradient measures 3.0 mmHg. Aortic valve peak gradient measures 5.8 mmHg. Aortic valve area, by VTI  measures 2.38 cm.  Pulmonic Valve: The pulmonic valve was normal in structure. Pulmonic valve regurgitation is not visualized. No evidence of pulmonic stenosis.  Aorta: The aortic root is normal in size and structure.  Venous: The inferior vena cava is normal in size with greater than 50% respiratory variability, suggesting right atrial pressure of 3 mmHg.  IAS/Shunts: No atrial level shunt detected by color flow Doppler.  Echo 09-02-23  IMPRESSIONS     1. Left ventricular ejection fraction, by estimation, is 65 to 70%. The  left ventricle has normal function. The left ventricle has no regional  wall motion abnormalities. Left ventricular diastolic parameters were  normal. The average left ventricular  global longitudinal strain is -24.1 %. The global longitudinal strain is  normal.   2. Right ventricular  systolic function is normal. The right ventricular  size is normal.   3. The mitral valve is normal in structure. No evidence of mitral valve  regurgitation. No evidence of mitral stenosis.   4. The aortic valve is tricuspid. Aortic valve regurgitation is not  visualized. Aortic valve sclerosis is present, with no evidence of aortic  valve stenosis.   5. The inferior vena cava is normal in size with greater than 50%  respiratory variability, suggesting right atrial pressure of 3 mmHg.   Comparison(s): Prior images reviewed side by side. Left ventricular  function is more dynamic.   FINDINGS   Left Ventricle: Left ventricular ejection fraction, by estimation, is 65  to 70%. The left ventricle has normal function. The left ventricle has no  regional wall motion abnormalities. The average left ventricular global  longitudinal strain is -24.1 %.  The global longitudinal strain is normal. The left ventricular internal  cavity size was normal in size. There is no left ventricular hypertrophy.  Left ventricular diastolic parameters were normal.   Right Ventricle: The right ventricular size is normal. No increase in  right ventricular wall thickness. Right ventricular systolic function is  normal.   Left Atrium: Left atrial size was normal in size.   Right Atrium: Right atrial size was normal in size.   Pericardium: There is no evidence of pericardial effusion.   Mitral Valve: The mitral valve is normal in structure. No evidence of  mitral valve regurgitation. No evidence of mitral valve stenosis.   Tricuspid Valve: The tricuspid valve is normal in structure. Tricuspid  valve regurgitation is trivial. No evidence of tricuspid stenosis.   Aortic Valve: The aortic valve is tricuspid. Aortic valve regurgitation is  not visualized. Aortic valve sclerosis is present, with no evidence of  aortic valve stenosis.   Pulmonic Valve: The pulmonic valve was not well visualized. Pulmonic valve   regurgitation is not visualized. No evidence of pulmonic stenosis.   Aorta: The aortic root, ascending aorta and aortic arch are all  structurally normal, with no evidence of dilitation or obstruction.   Venous: The inferior vena cava is normal in size with greater than 50%  respiratory variability, suggesting right atrial pressure of 3 mmHg.  IAS/Shunts: The atrial septum is grossly normal.       Cardiac event monitor 05/06/2021  Patch Wear Time: 13 days and 13 hours starting April 15, 2021. Indication: Palpitations  Patient had a minimum HR of 46 bpm, maximum HR of 146 bpm, and average HR of 73 bpm.  Predominant underlying rhythm was Sinus Rhythm.  2 Supraventricular Tachycardia runs occurred, the run with the fastest interval lasting 4 beats with a max rate of 146 bpm, the longest lasting 6 beats with an avg rate of 124 bpm.  Premature atrial complexes were rare (<1.0%). Premature ventricular compexes were rare (<1.0%).  Symptoms were associated with sinus rhythm.  Conclusions: This study was remarkable for rare paroxysmal supraventricular tachycardia.    Assessment & Plan   1.  Syncope, collapse-denies further episodes.  Evaluated by neurology who recommended cardiology follow-up.  Echocardiogram reassuring.  Details above.  Cardiac event monitor showed only rare episodes of SVT. Maintain p.o. hydration Continue to monitor  Sinus bradycardia-EKG today shows sinus rhythm 61 bpm no ST or T wave deviation. Maintain physical activity Heart healthy low-sodium diet  Preoperative cardiac evaluation-Port-A-Cath insertion with ultrasound guidance, Dr. Manus Rudd, [facility] surgery, fax #[phone]    Primary Cardiologist: Thomasene Ripple, DO  Chart reviewed as part of pre-operative protocol coverage. Given past medical history and time since last visit, based on ACC/AHA guidelines, Martha Washington would be at acceptable risk for the planned procedure without  further cardiovascular testing.   Her RCRI is very low risk, 0.4% risk of major cardiac event.  She is able to complete greater than 4 METS of physical activity.  Patient was advised that if she develops new symptoms prior to surgery to contact our office to arrange a follow-up appointment.  He verbalized understanding.  I will route this recommendation to the requesting party via Epic fax function and remove from pre-op pool.   Disposition: Follow-up with Dr. Servando Salina or me in 12 months.   Thomasene Ripple. Laurana Magistro NP-C     09/02/2023, 9:01 AM [facility] [address] Office [phone] Fax [phone]    I spent 14 minutes examining this patient, reviewing medications, and using patient centered shared decision making involving their cardiac care.   I spent greater than 20 minutes reviewing their past medical history,  medications, and prior cardiac tests.

## 2023-09-01 NOTE — Telephone Encounter (Signed)
Spoke with patient regarding MRI results and the need for add [place] bx left breast. Team notified. Orders placed.

## 2023-09-02 ENCOUNTER — Encounter: Payer: Self-pay | Admitting: General Practice

## 2023-09-02 ENCOUNTER — Encounter: Payer: Self-pay | Admitting: *Deleted

## 2023-09-02 ENCOUNTER — Ambulatory Visit: Payer: 59 | Attending: General Practice | Admitting: General Practice

## 2023-09-02 ENCOUNTER — Ambulatory Visit: Payer: 59 | Admitting: Physician Assistant

## 2023-09-02 ENCOUNTER — Ambulatory Visit: Payer: Self-pay | Admitting: Genetic Counselor

## 2023-09-02 ENCOUNTER — Encounter: Payer: Self-pay | Admitting: Genetic Counselor

## 2023-09-02 VITALS — BP 100/70 | HR 61 | Ht 64.0 in | Wt 191.0 lb

## 2023-09-02 DIAGNOSIS — R55 Syncope and collapse: Secondary | ICD-10-CM

## 2023-09-02 DIAGNOSIS — Z1379 Encounter for other screening for genetic and chromosomal anomalies: Secondary | ICD-10-CM | POA: Insufficient documentation

## 2023-09-02 DIAGNOSIS — R001 Bradycardia, unspecified: Secondary | ICD-10-CM

## 2023-09-02 DIAGNOSIS — C50412 Malignant neoplasm of upper-outer quadrant of left female breast: Secondary | ICD-10-CM

## 2023-09-02 DIAGNOSIS — Z01818 Encounter for other preprocedural examination: Secondary | ICD-10-CM

## 2023-09-02 NOTE — Patient Instructions (Signed)
Medication Instructions:  The current medical regimen is effective;  continue present plan and medications as directed. Please refer to the Current Medication list given to you today.  *If you need a refill on your cardiac medications before your next appointment, please call your pharmacy*  Lab Work: NONE  Other Instructions OK FOR UPCOMING PROCEDURE  Follow-Up: At [facility], you and your health needs are our priority.  As part of our continuing mission to provide you with exceptional heart care, we have created designated Provider Care Teams.  These Care Teams include your primary Cardiologist (physician) and Advanced Practice Providers (APPs -  Physician Assistants and Nurse Practitioners) who all work together to provide you with the care you need, when you need it.  Your next appointment:   12 month(s)  Provider:   Thomasene Ripple, DO

## 2023-09-02 NOTE — Progress Notes (Signed)
HPI:   Martha Washington was previously seen in the [facility] due to a personal history of breast cancer and concerns regarding a hereditary predisposition to cancer.    Martha Washington recent genetic test results were disclosed to her by telephone. These results and recommendations are discussed in more detail below.  CANCER HISTORY:  Oncology History  Malignant neoplasm of upper-outer quadrant of left breast in female, estrogen receptor positive (HCC)  08/04/2023 Mammogram   Further evaluation is suggested for calcifications in the left breast.  There is a group of coarse heterogeneous calcifications associated with an asymmetry in the UPPER OUTER QUADRANT at posterior depth spanning approximately 3.2 x 2.2 x 2.9 cm (AP x transverse x CC). There are linear and branching forms within the large group. There is a possible mass at the posterior margin of the calcifications measuring just under 1 cm in size.   Targeted ultrasound is performed, demonstrating an irregular hypoechoic mass with angular margins and associated calcifications at 2 o'clock 9 cm from the nipple measuring approximately 2.1 x 1.6 x 1.6 cm, demonstrating posterior acoustic shadowing and demonstrating internal power Doppler flow. There is a 0.4 x 0.3 x 0.2 cm satellite mass at 2 o'clock 7 cm from the nipple which is approximately 2 cm removed from the dominant mass.     08/16/2023 Pathology Results     08/22/2023 Initial Diagnosis   Malignant neoplasm of upper-outer quadrant of left breast in female, estrogen receptor positive (HCC)   08/24/2023 Cancer Staging   Staging form: Breast, AJCC 8th Edition - Clinical stage from 08/24/2023: Stage IB (cT2, cN0, cM0, G2, ER+, PR+, HER2+) - Signed by Rachel Moulds, MD on 08/24/2023 Stage prefix: Initial diagnosis Histologic grading system: 3 grade system Laterality: Left Staged by: Pathologist and managing physician Stage used in treatment planning:  Yes National guidelines used in treatment planning: Yes Type of national guideline used in treatment planning: NCCN   08/31/2023 Genetic Testing   Negative [employer] CancerNext+RNAinsight Panel.  Report date is 08/31/2023.   The [employer] CancerNext+RNAinsight Panel includes sequencing, rearrangement analysis, and RNA analysis for the following 39 genes: APC, ATM, BAP1, BARD1, BMPR1A, BRCA1, BRCA2, BRIP1, CDH1, CDKN2A, CHEK2, FH, FLCN, MET, MLH1, MSH2, MSH6, MUTYH, NF1, NTHL1, PALB2, PMS2, PTEN, RAD51C, RAD51D, SMAD4, STK11, TP53, TSC1, TSC2, and VHL (sequencing and deletion/duplication); AXIN2, HOXB13, MBD4, MSH3, POLD1 and POLE (sequencing only); EPCAM and GREM1 (deletion/duplication only).    09/07/2023 -  Chemotherapy   Patient is on Treatment Plan : BREAST  Docetaxel + Carboplatin + Trastuzumab + Pertuzumab  (TCHP) q21d        FAMILY HISTORY:  We obtained a detailed, 4-generation family history.  Significant diagnoses are listed below:      Family History  Problem Relation Age of Onset   Bone cancer Brother 22        or other primary?           Martha Washington thinks her brother may have had negative hereditary cancer genetic testing recently.  No reports were available for review.    There is no reported Ashkenazi Jewish ancestry. There is no known consanguinity.  GENETIC TEST RESULTS:  The [employer] CancerNext+RNAinsight Panel found no pathogenic mutations.   The [employer] CancerNext+RNAinsight Panel includes sequencing, rearrangement analysis, and RNA analysis for the following 39 genes: APC, ATM, BAP1, BARD1, BMPR1A, BRCA1, BRCA2, BRIP1, CDH1, CDKN2A, CHEK2, FH, FLCN, MET, MLH1, MSH2, MSH6, MUTYH, NF1, NTHL1, PALB2, PMS2, PTEN, RAD51C, RAD51D, SMAD4, STK11, TP53,  TSC1, TSC2, and VHL (sequencing and deletion/duplication); AXIN2, HOXB13, MBD4, MSH3, POLD1 and POLE (sequencing only); EPCAM and GREM1 (deletion/duplication only). .   The test report has been scanned into EPIC and is located under  the Molecular Pathology section of the Results Review tab.  A portion of the result report is included below for reference. Genetic testing reported out on August 31, 2023.      Even though a pathogenic variant was not identified, possible explanations for the cancer in the family may include: There may be no hereditary risk for cancer in the family. The cancers in Martha Washington and/or her family may be sporadic/familial or due to other genetic and environmental factors.  Most cancer is not hereditary.  There may be a gene mutation in one of these genes that current testing methods cannot detect but that chance is small. There could be another gene that has not yet been discovered, or that we have not yet tested, that is responsible for the cancer diagnoses in the family.    Therefore, it is important to remain in touch with cancer genetics in the future so that we can continue to offer Martha Washington the most up to date genetic testing.    ADDITIONAL GENETIC TESTING:   Martha Washington genetic testing was fairly extensive.  If there are additional relevant genes identified to increase cancer risk that can be analyzed in the future, we would be happy to discuss and coordinate this testing at that time.     CANCER SCREENING RECOMMENDATIONS:  Martha Washington test result is considered negative (normal).  This means that we have not identified a hereditary cause for her personal history of bresat cancer at this time.   An individual's cancer risk and medical management are not determined by genetic test results alone. Overall cancer risk assessment incorporates additional factors, including personal medical history, family history, and any available genetic information that may result in a personalized plan for cancer prevention and surveillance. Therefore, it is recommended she continue to follow the cancer management and screening guidelines provided by her oncology and primary healthcare  provider.    RECOMMENDATIONS FOR FAMILY MEMBERS:   Since she did not inherit a identifiable mutation in a cancer predisposition gene included on this panel, her children could not have inherited a known mutation from her in one of these genes. Individuals in this family might be at some increased risk of developing cancer, over the general population risk, due to the family history of cancer.  Individuals in the family should notify their providers of the family history of cancer. We recommend women in this family have a yearly mammogram beginning at age 71, or 43 years younger than the earliest onset of cancer, an annual clinical breast exam, and perform monthly breast self-exams.  Risk models that take into account family history and hormonal history may be helpful in determining appropriate breast cancer screening options for family members.    FOLLOW-UP:  Cancer genetics is a rapidly advancing field and it is possible that new genetic tests will be appropriate for her and/or her family members in the future. We encourage Martha Washington to remain in contact with cancer genetics, so we can update her personal and family histories and let her know of advances in cancer genetics that may benefit this family.   Our contact number was provided.  They are welcome to call us at anytime with additional questions or concerns.   Chaston Bradburn M. Rennie Plowman, MS, [facility] Genetic Counselor  [email or web address] (P) [phone]

## 2023-09-03 ENCOUNTER — Other Ambulatory Visit: Payer: Self-pay | Admitting: Hematology and Oncology

## 2023-09-05 ENCOUNTER — Other Ambulatory Visit: Payer: Self-pay | Admitting: Pharmacist

## 2023-09-05 ENCOUNTER — Telehealth: Payer: Self-pay | Admitting: *Deleted

## 2023-09-05 ENCOUNTER — Inpatient Hospital Stay: Payer: 59 | Admitting: Hematology and Oncology

## 2023-09-05 ENCOUNTER — Encounter: Payer: Self-pay | Admitting: *Deleted

## 2023-09-05 ENCOUNTER — Encounter: Payer: Self-pay | Admitting: Hematology and Oncology

## 2023-09-05 ENCOUNTER — Inpatient Hospital Stay: Payer: 59

## 2023-09-05 ENCOUNTER — Other Ambulatory Visit: Payer: 59

## 2023-09-05 DIAGNOSIS — Z170 Estrogen receptor positive status [ER+]: Secondary | ICD-10-CM

## 2023-09-05 NOTE — Telephone Encounter (Signed)
Received call from patient stating she has tested positive for the flu and is sick and has a fever.  She cx her appt for her [place] bx and I have sent a message to cx her chemo and appts for Dr. Al Pimple and also to Dr. Corliss Skains about cx her port for tomorrow.  Informed we would get everything r/s.  We may be able to get her port placed on 2/6 and and start chemo 2/7 and get her [place] bx r/s. She states she is starting Tami flu today and is hoping to be feeling better by the end of the week. I will check in with her later this week and make sure she has all the appts r/s.

## 2023-09-05 NOTE — Progress Notes (Deleted)
 [facility] CONSULT NOTE  Patient Care Team: Lind Covert, MD as PCP - General (Family Medicine) Thomasene Ripple, DO as PCP - Cardiology (Cardiology) Pershing Proud, RN as Oncology Nurse Navigator Donnelly Angelica, RN as Oncology Nurse Navigator Rachel Moulds, MD as Consulting Physician (Hematology and Oncology) Manus Rudd, MD as Consulting Physician (General Surgery) Lonie Peak, MD as Attending Physician (Radiation Oncology)  CHIEF COMPLAINTS/PURPOSE OF CONSULTATION:  Newly diagnosed breast cancer  HISTORY OF PRESENTING ILLNESS:  Martha Washington 58 y.o. female is here because of recent diagnosis of left breast cancer  I reviewed her records extensively and collaborated the history with the patient.  SUMMARY OF ONCOLOGIC HISTORY: Oncology History  Malignant neoplasm of upper-outer quadrant of left breast in female, estrogen receptor positive (HCC)  08/04/2023 Mammogram   Further evaluation is suggested for calcifications in the left breast.  There is a group of coarse heterogeneous calcifications associated with an asymmetry in the UPPER OUTER QUADRANT at posterior depth spanning approximately 3.2 x 2.2 x 2.9 cm (AP x transverse x CC). There are linear and branching forms within the large group. There is a possible mass at the posterior margin of the calcifications measuring just under 1 cm in size.   Targeted ultrasound is performed, demonstrating an irregular hypoechoic mass with angular margins and associated calcifications at 2 o'clock 9 cm from the nipple measuring approximately 2.1 x 1.6 x 1.6 cm, demonstrating posterior acoustic shadowing and demonstrating internal power Doppler flow. There is a 0.4 x 0.3 x 0.2 cm satellite mass at 2 o'clock 7 cm from the nipple which is approximately 2 cm removed from the dominant mass.     08/16/2023 Pathology Results     08/22/2023 Initial Diagnosis   Malignant neoplasm of upper-outer quadrant of left breast  in female, estrogen receptor positive (HCC)   08/24/2023 Cancer Staging   Staging form: Breast, AJCC 8th Edition - Clinical stage from 08/24/2023: Stage IB (cT2, cN0, cM0, G2, ER+, PR+, HER2+) - Signed by Rachel Moulds, MD on 08/24/2023 Stage prefix: Initial diagnosis Histologic grading system: 3 grade system Laterality: Left Staged by: Pathologist and managing physician Stage used in treatment planning: Yes National guidelines used in treatment planning: Yes Type of national guideline used in treatment planning: NCCN   08/31/2023 Genetic Testing   Negative [facility] CancerNext+RNAinsight Panel.  Report date is 08/31/2023.   The [employer] CancerNext+RNAinsight Panel includes sequencing, rearrangement analysis, and RNA analysis for the following 39 genes: APC, ATM, BAP1, BARD1, BMPR1A, BRCA1, BRCA2, BRIP1, CDH1, CDKN2A, CHEK2, FH, FLCN, MET, MLH1, MSH2, MSH6, MUTYH, NF1, NTHL1, PALB2, PMS2, PTEN, RAD51C, RAD51D, SMAD4, STK11, TP53, TSC1, TSC2, and VHL (sequencing and deletion/duplication); AXIN2, HOXB13, MBD4, MSH3, POLD1 and POLE (sequencing only); EPCAM and GREM1 (deletion/duplication only).    09/07/2023 -  Chemotherapy   Patient is on Treatment Plan : BREAST  Docetaxel + Carboplatin + Trastuzumab + Pertuzumab  (TCHP) q21d       The patient, a 58 year old postmenopausal individual with a history of back surgery, presents for discussion of recent diagnosis of invasive ductal breast cancer. The cancer was discovered incidentally during a mammogram, which was scheduled after the patient realized she had not had a mammogram since 2018. The patient reports occasional sharp pains in the breast, but otherwise has no symptoms. The patient has a family history of bone cancer in a brother. The patient is currently on a clean eating diet and has stopped taking Zepbound, she used it for approximately 3 months.  The patient experiences occasional numbness in the right foot, which she attributes to her history of back  surgery. Rest of the pertinent 10 point ROS reviewed and neg.  MEDICAL HISTORY:  Past Medical History:  Diagnosis Date   Atrial fibrillation with RVR (HCC)    GERD (gastroesophageal reflux disease)    no meds   High cholesterol    Menorrhagia 07/13/2013   Missed abortion    resolved - no surgery required   New onset atrial fibrillation (HCC) 08/04/2017   with RVR to the 160s; S/P hammertoe OR/notes 08/04/2017   S/P bunionectomy 08/04/2017   SVD (spontaneous vaginal delivery) X 2   Tachycardia 08/04/2017    SURGICAL HISTORY: Past Surgical History:  Procedure Laterality Date   ABDOMINAL HYSTERECTOMY     BACK SURGERY     BREAST BIOPSY Left 08/16/2023   [place] LT BREAST BX W LOC DEV 1ST LESION IMG BX SPEC US GUIDE 08/16/2023 [facility] MAMMOGRAPHY   BUNIONECTOMY WITH HAMMERTOE RECONSTRUCTION Right 03/2017   BUNIONECTOMY WITH HAMMERTOE RECONSTRUCTION Left 08/04/2017   BUNIONECTOMY WITH HAMMERTOE RECONSTRUCTION Left 08/04/2017   Procedure: Left modified McBride and Lapidus bunion corrections; Left 2-3 Weil and hammertoe corrections;  Surgeon: Toni Arthurs, MD;  Location: [facility];  Service: Orthopedics;  Laterality: Left;   ROBOTIC ASSISTED TOTAL HYSTERECTOMY WITH BILATERAL SALPINGO OOPHERECTOMY N/A 07/13/2013   Procedure: ROBOTIC ASSISTED TOTAL HYSTERECTOMY WITH BILATERAL SALPINGECTOMY;  Surgeon: Esmeralda Arthur, MD;  Location: [facility];  Service: Gynecology;  Laterality: N/A;   SHOULDER ARTHROSCOPY W/ ROTATOR CUFF REPAIR Right 2010   SHOULDER ARTHROSCOPY WITH ROTATOR CUFF REPAIR Right 10/17/2014   Procedure: SHOULDER ARTHROSCOPY WITH ROTATOR CUFF REPAIR;  Surgeon: Mckinley Jewel, MD;  Location: [facility];  Service: Orthopedics;  Laterality: Right;   TUBAL LIGATION     WEIL OSTEOTOMY Left 08/04/2017   Procedure: Left 2nd and 3rd weil osteotomy;  Surgeon: Toni Arthurs, MD;  Location: [facility];  Service: Orthopedics;  Laterality: Left;   WISDOM TOOTH  EXTRACTION      SOCIAL HISTORY: Social History   Socioeconomic History   Marital status: Married    Spouse name: Merchant navy officer   Number of children: Not on file   Years of education: Not on file   Highest education level: Not on file  Occupational History   Not on file  Tobacco Use   Smoking status: Never   Smokeless tobacco: Never  Vaping Use   Vaping status: Never Used  Substance and Sexual Activity   Alcohol use: No   Drug use: No   Sexual activity: Not on file  Other Topics Concern   Not on file  Social History Narrative   Lives with husband   Right Handed   Drinks 1 cup caffeine daily   Social Drivers of Corporate investment banker Strain: Not on file  Food Insecurity: Not on file  Transportation Needs: Not on file  Physical Activity: Not on file  Stress: Not on file  Social Connections: Unknown (12/22/2021)   Received from [facility]   Social Network    Social Network: Not on file  Intimate Partner Violence: Unknown (11/13/2021)   Received from [facility], [facility]   HITS    Physically Hurt: Not on file    Insult or Talk Down To: Not on file    Threaten Physical Harm: Not on file    Scream or Curse: Not on file    FAMILY HISTORY: Family  History  Problem Relation Age of Onset   Hypertension Mother    Hypertension Father    Bone cancer Brother 98       or other primary?    ALLERGIES:  is allergic to azithromycin, morphine and codeine, and oxycodone.  MEDICATIONS:  Current Outpatient Medications  Medication Sig Dispense Refill   Ascorbic Acid (VITAMIN C) 1000 MG tablet Take 1,000 mg by mouth daily.     b complex vitamins capsule Take 1 capsule by mouth daily.     dexamethasone (DECADRON) 4 MG tablet Take 2 tabs by mouth 2 times daily starting day before chemo. Then take 2 tabs daily for 2 days starting day after chemo. Take with food. 30 tablet 1   escitalopram (LEXAPRO) 10 MG tablet Take 1 tablet (10 mg total) by mouth daily. 90  tablet 0   lidocaine-prilocaine (EMLA) cream Apply to affected area once 30 g 3   ondansetron (ZOFRAN) 8 MG tablet Take 1 tablet (8 mg total) by mouth every 8 (eight) hours as needed for nausea or vomiting. Start on the third day after chemotherapy. 30 tablet 1   prochlorperazine (COMPAZINE) 10 MG tablet Take 1 tablet (10 mg total) by mouth every 6 (six) hours as needed for nausea or vomiting. 30 tablet 1   VITAMIN D PO Take by mouth.     ZINC CITRATE PO Take by mouth.     No current facility-administered medications for this visit.    REVIEW OF SYSTEMS:   Constitutional: Denies fevers, chills or abnormal night sweats Eyes: Denies blurriness of vision, double vision or watery eyes Ears, nose, mouth, throat, and face: Denies mucositis or sore throat Respiratory: Denies cough, dyspnea or wheezes Cardiovascular: Denies palpitation, chest discomfort or lower extremity swelling Gastrointestinal:  Denies nausea, heartburn or change in bowel habits Skin: Denies abnormal skin rashes Lymphatics: Denies new lymphadenopathy or easy bruising Neurological:Denies numbness, tingling or new weaknesses Behavioral/Psych: Mood is stable, no new changes  Breast: Denies any palpable lumps or discharge All other systems were reviewed with the patient and are negative.  PHYSICAL EXAMINATION: ECOG PERFORMANCE STATUS: 0 - Asymptomatic  There were no vitals filed for this visit.  There were no vitals filed for this visit.   GENERAL:alert, no distress and comfortable SKIN: skin color, texture, turgor are normal, no rashes or significant lesions BREAST:post biopsy changes, No palpable adenopathy  LABORATORY DATA:  I have reviewed the data as listed Lab Results  Component Value Date   WBC 6.1 08/24/2023   HGB 13.5 08/24/2023   HCT 41.3 08/24/2023   MCV 84.1 08/24/2023   PLT 244 08/24/2023   Lab Results  Component Value Date   NA 140 08/24/2023   K 4.0 08/24/2023   CL 104 08/24/2023   CO2 30  08/24/2023    RADIOGRAPHIC STUDIES: I have personally reviewed the radiological reports and agreed with the findings in the report.  ASSESSMENT AND PLAN:  No problem-specific Assessment & Plan notes found for this encounter.   Time spent: 45 min. All questions were answered. The patient knows to call the clinic with any problems, questions or concerns.    Rachel Moulds, MD 09/05/23

## 2023-09-05 NOTE — Telephone Encounter (Signed)
Patient was cleared by Edd Fabian during office visit on 1/24.

## 2023-09-05 NOTE — Telephone Encounter (Signed)
Pt called this office today stating she is positive for the flu and need to cancel lab and MD for today.  Noted pt is to get port placed tomorrow by Dr Corliss Skains and then receive first chemo on Wed.  This note will be forwarded to the MD's and Navigators for appropriate follow up and re scheduling of port for additional appt for therapy.

## 2023-09-05 NOTE — Telephone Encounter (Signed)
I will send notes per Azalee Course, [facility] that the pt was cleared by Edd Fabian, [facility] 09/02/23.

## 2023-09-06 ENCOUNTER — Other Ambulatory Visit: Payer: 59

## 2023-09-06 ENCOUNTER — Ambulatory Visit: Payer: 59 | Admitting: Hematology and Oncology

## 2023-09-06 ENCOUNTER — Other Ambulatory Visit: Payer: Self-pay

## 2023-09-06 DIAGNOSIS — I2510 Atherosclerotic heart disease of native coronary artery without angina pectoris: Secondary | ICD-10-CM

## 2023-09-07 ENCOUNTER — Inpatient Hospital Stay: Payer: 59

## 2023-09-07 ENCOUNTER — Encounter: Payer: Self-pay | Admitting: Hematology and Oncology

## 2023-09-08 NOTE — Progress Notes (Signed)
   09/08/23 1132  PAT Phone Screen  Recent  Lab Work, EKG, CXR? Yes  Where was this test performed? (S)  EKG 09/02/23 - visit for w/ Oris Drone NP for Cardiac Clearance. CBC CMET 08/24/23  Results viewable: [facility] Media Tab   Pt's surgery postponed due to + flu test on 09/05/23. Pt is  now scheduled for port placement on 09/15/23. States that her symptoms were fever and body aches. Denies cough or congestion. Reviewed  with Dr Mal Amabile. Because port placement is needed urgently due to cancer diagnosis we can proceed as planned on 09/15/23. Pt will be evaluated on dos by anesthesiologist  and postponed if deemed necessary.

## 2023-09-09 ENCOUNTER — Other Ambulatory Visit: Payer: Self-pay

## 2023-09-09 ENCOUNTER — Ambulatory Visit: Payer: 59

## 2023-09-09 ENCOUNTER — Telehealth: Payer: Self-pay | Admitting: *Deleted

## 2023-09-09 ENCOUNTER — Other Ambulatory Visit (HOSPITAL_COMMUNITY): Payer: 59

## 2023-09-09 ENCOUNTER — Other Ambulatory Visit: Payer: 59

## 2023-09-09 NOTE — Progress Notes (Signed)
 Pharmacist Chemotherapy Monitoring - Initial Assessment    Anticipated start date: 09/16/23   The following has been reviewed per standard work regarding the patient's treatment regimen: The patient's diagnosis, treatment plan and drug doses, and organ/hematologic function Lab orders and baseline tests specific to treatment regimen  The treatment plan start date, drug sequencing, and pre-medications Prior authorization status  Patient's documented medication list, including drug-drug interaction screen and prescriptions for anti-emetics and supportive care specific to the treatment regimen The drug concentrations, fluid compatibility, administration routes, and timing of the medications to be used The patient's access for treatment and lifetime cumulative dose history, if applicable  The patient's medication allergies and previous infusion related reactions, if applicable   Changes made to treatment plan:  N/A  Follow up needed:  Plan for port placement 2/6    Martha Washington, PharmD PGY2 Oncology Pharmacy Resident   09/09/2023 12:43 PM

## 2023-09-09 NOTE — Telephone Encounter (Signed)
[employer] 2021-05 - Specimen Collection Study to Evaluate Biomarkers in Subjects with Cancer   Patient's treatment start date was delayed r/t illness along with the associated lab appointment to collect research samples prior to treatment. The new start date 09/16/23 is 2 days outside the consenting window of 14 days per protocol and patient would need to be re-consented prior to lab draw that day.  Unfortunately there are no research staff available that day to re-consent patient. Asked patient if she would be willing to come in earlier next week to have her research labs drawn and can have her cycle 1 labs drawn at same time since it will be within 7 days of her chemotherapy appointment.  Patient agreed to come in this Monday 09/12/23 at 10:00 am.  Thanked patient for her flexibility and participation in this study.  Domenica Reamer, BSN, RN, [employer] Research Nurse II [phone] 09/09/2023 9:39 AM

## 2023-09-10 ENCOUNTER — Ambulatory Visit: Payer: 59

## 2023-09-12 ENCOUNTER — Inpatient Hospital Stay: Payer: 59 | Attending: Hematology and Oncology

## 2023-09-12 ENCOUNTER — Encounter: Payer: Self-pay | Admitting: *Deleted

## 2023-09-12 ENCOUNTER — Ambulatory Visit
Admission: RE | Admit: 2023-09-12 | Discharge: 2023-09-12 | Disposition: A | Discharge status: Home / Self Care | Payer: 59 | Source: Ambulatory Visit | Attending: Hematology and Oncology | Admitting: Hematology and Oncology

## 2023-09-12 DIAGNOSIS — Z5111 Encounter for antineoplastic chemotherapy: Secondary | ICD-10-CM | POA: Diagnosis not present

## 2023-09-12 DIAGNOSIS — R000 Tachycardia, unspecified: Secondary | ICD-10-CM | POA: Insufficient documentation

## 2023-09-12 DIAGNOSIS — Z79899 Other long term (current) drug therapy: Secondary | ICD-10-CM | POA: Insufficient documentation

## 2023-09-12 DIAGNOSIS — N920 Excessive and frequent menstruation with regular cycle: Secondary | ICD-10-CM | POA: Diagnosis not present

## 2023-09-12 DIAGNOSIS — Z170 Estrogen receptor positive status [ER+]: Secondary | ICD-10-CM

## 2023-09-12 DIAGNOSIS — Z7952 Long term (current) use of systemic steroids: Secondary | ICD-10-CM | POA: Diagnosis not present

## 2023-09-12 DIAGNOSIS — K219 Gastro-esophageal reflux disease without esophagitis: Secondary | ICD-10-CM | POA: Insufficient documentation

## 2023-09-12 DIAGNOSIS — E7800 Pure hypercholesterolemia, unspecified: Secondary | ICD-10-CM | POA: Diagnosis not present

## 2023-09-12 DIAGNOSIS — Z5189 Encounter for other specified aftercare: Secondary | ICD-10-CM | POA: Diagnosis not present

## 2023-09-12 DIAGNOSIS — I4891 Unspecified atrial fibrillation: Secondary | ICD-10-CM | POA: Insufficient documentation

## 2023-09-12 DIAGNOSIS — J111 Influenza due to unidentified influenza virus with other respiratory manifestations: Secondary | ICD-10-CM | POA: Insufficient documentation

## 2023-09-12 DIAGNOSIS — C50412 Malignant neoplasm of upper-outer quadrant of left female breast: Secondary | ICD-10-CM | POA: Diagnosis present

## 2023-09-12 HISTORY — PX: BREAST BIOPSY: SHX20

## 2023-09-12 LAB — CMP (CANCER CENTER ONLY)
ALT: 25 U/L (ref 0–44)
AST: 22 U/L (ref 15–41)
Albumin: 4.1 g/dL (ref 3.5–5.0)
Alkaline Phosphatase: 56 U/L (ref 38–126)
Anion gap: 6 (ref 5–15)
BUN: 21 mg/dL — ABNORMAL HIGH (ref 6–20)
CO2: 29 mmol/L (ref 22–32)
Calcium: 9.3 mg/dL (ref 8.9–10.3)
Chloride: 106 mmol/L (ref 98–111)
Creatinine: 0.66 mg/dL (ref 0.44–1.00)
GFR, Estimated: >60 mL/min (ref >60)
Glucose, Bld: 70 mg/dL (ref 70–99)
Potassium: 4 mmol/L (ref 3.5–5.1)
Sodium: 141 mmol/L (ref 135–145)
Total Bilirubin: 0.4 mg/dL (ref 0.0–1.2)
Total Protein: 7.1 g/dL (ref 6.5–8.1)

## 2023-09-12 LAB — CBC WITH DIFFERENTIAL (CANCER CENTER ONLY)
Abs Immature Granulocytes: 0.03 10*3/uL (ref 0.00–0.07)
Basophils Absolute: 0 10*3/uL (ref 0.0–0.1)
Basophils Relative: 0 %
Eosinophils Absolute: 0.1 10*3/uL (ref 0.0–0.5)
Eosinophils Relative: 1 %
HCT: 43.6 % (ref 36.0–46.0)
Hemoglobin: 13.8 g/dL (ref 12.0–15.0)
Immature Granulocytes: 1 %
Lymphocytes Relative: 39 %
Lymphs Abs: 2.1 10*3/uL (ref 0.7–4.0)
MCH: 27.5 pg (ref 26.0–34.0)
MCHC: 31.7 g/dL (ref 30.0–36.0)
MCV: 87 fL (ref 80.0–100.0)
Monocytes Absolute: 0.4 10*3/uL (ref 0.1–1.0)
Monocytes Relative: 8 %
Neutro Abs: 2.8 10*3/uL (ref 1.7–7.7)
Neutrophils Relative %: 51 %
Platelet Count: 291 10*3/uL (ref 150–400)
RBC: 5.01 MIL/uL (ref 3.87–5.11)
RDW: 13.5 % (ref 11.5–15.5)
WBC Count: 5.5 10*3/uL (ref 4.0–10.5)
nRBC: 0 % (ref 0.0–0.2)

## 2023-09-12 LAB — RESEARCH LABS

## 2023-09-12 MED ORDER — CHLORHEXIDINE GLUCONATE CLOTH 2 % EX PADS: 6.0000 | MEDICATED_PAD | Freq: Once | CUTANEOUS | Status: DC

## 2023-09-12 NOTE — Research (Signed)
Eligibility: Eligibility criteria was reviewed with patient when she consented. This Nurse has reviewed this patient's inclusion and exclusion criteria and confirmed patient is eligible for study participation today. Eligibility confirmed by treating investigator, who also agrees that patient should proceed with enrollment. Patient will continue with enrollment.  Blood Collection: Research blood obtained by fresh venipuncture. Patient tolerated well without any adverse events.  Gift Card: $50 gift card given to patient for her participation in this study.    Patient was thanked for their participation in this study.    Domenica Reamer, BSN, RN, [employer] Research Nurse II [phone] 09/12/2023 10:15 AM

## 2023-09-12 NOTE — Progress Notes (Signed)

## 2023-09-12 NOTE — Research (Signed)
[employer] 2021-05 - Specimen Collection Study to Evaluate Biomarkers in Subjects with Cancer   This Nurse has reviewed this patient's inclusion and exclusion criteria as a second review and confirms Martha Washington is eligible for study participation.  Patient may continue with enrollment.  Zerita Boers BSN RN Clinical Research Nurse [facility] Direct Dial: [phone] 09/12/2023  10:16 AM

## 2023-09-13 ENCOUNTER — Encounter: Payer: Self-pay | Admitting: Hematology and Oncology

## 2023-09-13 ENCOUNTER — Encounter: Payer: Self-pay | Admitting: *Deleted

## 2023-09-13 LAB — SURGICAL PATHOLOGY

## 2023-09-14 ENCOUNTER — Telehealth: Payer: Self-pay | Admitting: *Deleted

## 2023-09-14 NOTE — Telephone Encounter (Signed)
 DCP-001: Use of a Clinical Trial Screening Tool to Address Cancer Health Disparities in the [employer] Community [employer]cology Research Program [facility])   Patient declined [phone] and is eligible to participate in the above study. Introduced study to patient, briefly explaining the purpose and potential risks, along with the study design that involves a one time data collection. Informed patient that the consent and questions would take approximately 20 minutes and can be done while she is in clinic.  Also informed participation is completely voluntary.  Patient stated she was not interested in participating. Thanked patient for her time in speaking with research nurse today.  Cherylyn Hoard, BSN, RN, [employer] Research Nurse II [phone] 09/14/2023 9:57 AM

## 2023-09-15 ENCOUNTER — Other Ambulatory Visit: Payer: Self-pay

## 2023-09-15 ENCOUNTER — Ambulatory Visit (HOSPITAL_BASED_OUTPATIENT_CLINIC_OR_DEPARTMENT_OTHER)
Admission: RE | Admit: 2023-09-15 | Discharge: 2023-09-15 | Disposition: A | Discharge status: Home / Self Care | Payer: 59 | Attending: Surgery | Admitting: Surgery

## 2023-09-15 ENCOUNTER — Ambulatory Visit (HOSPITAL_BASED_OUTPATIENT_CLINIC_OR_DEPARTMENT_OTHER): Payer: 59 | Admitting: Anesthesiology

## 2023-09-15 ENCOUNTER — Encounter (HOSPITAL_BASED_OUTPATIENT_CLINIC_OR_DEPARTMENT_OTHER): Payer: Self-pay | Admitting: Surgery

## 2023-09-15 ENCOUNTER — Ambulatory Visit (HOSPITAL_COMMUNITY): Payer: 59

## 2023-09-15 ENCOUNTER — Encounter (HOSPITAL_BASED_OUTPATIENT_CLINIC_OR_DEPARTMENT_OTHER)
Admission: RE | Disposition: A | Discharge status: Home / Self Care | Payer: Self-pay | Source: Home / Self Care | Attending: Surgery

## 2023-09-15 DIAGNOSIS — C50412 Malignant neoplasm of upper-outer quadrant of left female breast: Secondary | ICD-10-CM | POA: Insufficient documentation

## 2023-09-15 DIAGNOSIS — Z1731 Human epidermal growth factor receptor 2 positive status: Secondary | ICD-10-CM | POA: Insufficient documentation

## 2023-09-15 DIAGNOSIS — I2510 Atherosclerotic heart disease of native coronary artery without angina pectoris: Secondary | ICD-10-CM

## 2023-09-15 DIAGNOSIS — Z452 Encounter for adjustment and management of vascular access device: Secondary | ICD-10-CM | POA: Diagnosis present

## 2023-09-15 DIAGNOSIS — I4891 Unspecified atrial fibrillation: Secondary | ICD-10-CM | POA: Insufficient documentation

## 2023-09-15 DIAGNOSIS — Z1721 Progesterone receptor positive status: Secondary | ICD-10-CM | POA: Insufficient documentation

## 2023-09-15 DIAGNOSIS — Z170 Estrogen receptor positive status [ER+]: Secondary | ICD-10-CM | POA: Diagnosis not present

## 2023-09-15 DIAGNOSIS — D0512 Intraductal carcinoma in situ of left breast: Secondary | ICD-10-CM | POA: Diagnosis not present

## 2023-09-15 HISTORY — PX: PORTACATH PLACEMENT: SHX2246

## 2023-09-15 SURGERY — INSERTION, TUNNELED CENTRAL VENOUS DEVICE, WITH PORT
Anesthesia: General | Site: Chest | Laterality: Right

## 2023-09-15 MED ORDER — BUPIVACAINE-EPINEPHRINE 0.25% -1:200000 IJ SOLN
INTRAMUSCULAR | Status: DC | PRN
  Administered 2023-09-15: 10 mL

## 2023-09-15 MED ORDER — ACETAMINOPHEN 500 MG PO TABS
1000.0000 mg | ORAL_TABLET | ORAL | Status: AC
  Administered 2023-09-15: 1000 mg via ORAL

## 2023-09-15 MED ORDER — LIDOCAINE 2% (20 MG/ML) 5 ML SYRINGE
INTRAMUSCULAR | Status: AC
  Filled 2023-09-15: qty 5

## 2023-09-15 MED ORDER — DEXAMETHASONE SODIUM PHOSPHATE 4 MG/ML IJ SOLN
INTRAMUSCULAR | Status: DC | PRN
  Administered 2023-09-15: 10 mg via INTRAVENOUS

## 2023-09-15 MED ORDER — PHENYLEPHRINE HCL (PRESSORS) 10 MG/ML IV SOLN
INTRAVENOUS | Status: DC | PRN
  Administered 2023-09-15 (×3): 80 ug via INTRAVENOUS

## 2023-09-15 MED ORDER — PROPOFOL 10 MG/ML IV BOLUS
INTRAVENOUS | Status: AC
  Filled 2023-09-15: qty 20

## 2023-09-15 MED ORDER — ONDANSETRON HCL 4 MG/2ML IJ SOLN
INTRAMUSCULAR | Status: AC
  Filled 2023-09-15: qty 2

## 2023-09-15 MED ORDER — ACETAMINOPHEN 500 MG PO TABS
ORAL_TABLET | ORAL | Status: AC
  Filled 2023-09-15: qty 2

## 2023-09-15 MED ORDER — MIDAZOLAM HCL 2 MG/2ML IJ SOLN
INTRAMUSCULAR | Status: AC
  Filled 2023-09-15: qty 2

## 2023-09-15 MED ORDER — FENTANYL CITRATE (PF) 100 MCG/2ML IJ SOLN
INTRAMUSCULAR | Status: AC
  Filled 2023-09-15: qty 2

## 2023-09-15 MED ORDER — ACETAMINOPHEN 10 MG/ML IV SOLN: 1000.0000 mg | Freq: Once | INTRAVENOUS | Status: DC | PRN

## 2023-09-15 MED ORDER — LACTATED RINGERS IV SOLN: INTRAVENOUS | Status: DC

## 2023-09-15 MED ORDER — ONDANSETRON HCL 4 MG/2ML IJ SOLN
INTRAMUSCULAR | Status: DC | PRN
  Administered 2023-09-15: 4 mg via INTRAVENOUS

## 2023-09-15 MED ORDER — FENTANYL CITRATE (PF) 100 MCG/2ML IJ SOLN
INTRAMUSCULAR | Status: DC | PRN
  Administered 2023-09-15 (×2): 50 ug via INTRAVENOUS

## 2023-09-15 MED ORDER — HEPARIN SOD (PORK) LOCK FLUSH 100 UNIT/ML IV SOLN
INTRAVENOUS | Status: DC | PRN
  Administered 2023-09-15: 500 [IU] via INTRAVENOUS

## 2023-09-15 MED ORDER — CEFAZOLIN SODIUM-DEXTROSE 2-4 GM/100ML-% IV SOLN
2.0000 g | INTRAVENOUS | Status: AC
  Administered 2023-09-15: 2 g via INTRAVENOUS

## 2023-09-15 MED ORDER — PROPOFOL 10 MG/ML IV BOLUS
INTRAVENOUS | Status: DC | PRN
  Administered 2023-09-15: 150 mg via INTRAVENOUS

## 2023-09-15 MED ORDER — ONDANSETRON HCL 4 MG/2ML IJ SOLN: 4.0000 mg | Freq: Once | INTRAMUSCULAR | Status: DC | PRN

## 2023-09-15 MED ORDER — MIDAZOLAM HCL 5 MG/5ML IJ SOLN
INTRAMUSCULAR | Status: DC | PRN
  Administered 2023-09-15: 2 mg via INTRAVENOUS

## 2023-09-15 MED ORDER — CEFAZOLIN SODIUM-DEXTROSE 2-4 GM/100ML-% IV SOLN
INTRAVENOUS | Status: AC
  Filled 2023-09-15: qty 100

## 2023-09-15 MED ORDER — LIDOCAINE HCL (CARDIAC) PF 100 MG/5ML IV SOSY
PREFILLED_SYRINGE | INTRAVENOUS | Status: DC | PRN
  Administered 2023-09-15: 60 mg via INTRAVENOUS

## 2023-09-15 MED ORDER — FENTANYL CITRATE (PF) 100 MCG/2ML IJ SOLN: 25.0000 ug | INTRAMUSCULAR | Status: DC | PRN

## 2023-09-15 MED ORDER — HEPARIN (PORCINE) IN NACL 2-0.9 UNITS/ML
INTRAMUSCULAR | Status: AC | PRN
  Administered 2023-09-15: 1 via INTRAVENOUS

## 2023-09-15 MED ORDER — DEXAMETHASONE SODIUM PHOSPHATE 10 MG/ML IJ SOLN
INTRAMUSCULAR | Status: AC
  Filled 2023-09-15: qty 1

## 2023-09-15 SURGICAL SUPPLY — 47 items
BAG DECANTER FOR FLEXI CONT (MISCELLANEOUS) ×1 IMPLANT
BENZOIN TINCTURE PRP APPL 2/3 (GAUZE/BANDAGES/DRESSINGS) ×1 IMPLANT
BLADE SURG 11 STRL SS (BLADE) ×1 IMPLANT
BLADE SURG 15 STRL LF DISP TIS (BLADE) ×1 IMPLANT
CANISTER SUCT 1200ML W/VALVE (MISCELLANEOUS) IMPLANT
CHLORAPREP W/TINT 26 (MISCELLANEOUS) ×1 IMPLANT
CLEANER CAUTERY TIP PAD (MISCELLANEOUS) ×1 IMPLANT
COVER BACK TABLE 60X90IN (DRAPES) ×1 IMPLANT
COVER MAYO STAND STRL (DRAPES) ×1 IMPLANT
DRAPE C-ARM 42X72 X-RAY (DRAPES) ×1 IMPLANT
DRAPE LAPAROTOMY TRNSV 102X78 (DRAPES) ×1 IMPLANT
DRAPE UTILITY XL STRL (DRAPES) ×1 IMPLANT
DRSG TEGADERM 2-3/8X2-3/4 SM (GAUZE/BANDAGES/DRESSINGS) IMPLANT
DRSG TEGADERM 4X4.75 (GAUZE/BANDAGES/DRESSINGS) ×1 IMPLANT
ELECT REM PT RETURN 9FT ADLT (ELECTROSURGICAL) ×1
ELECTRODE REM PT RTRN 9FT ADLT (ELECTROSURGICAL) ×1 IMPLANT
GAUZE 4X4 16PLY ~~LOC~~+RFID DBL (SPONGE) ×1 IMPLANT
GAUZE SPONGE 2X2 STRL 8-PLY (GAUZE/BANDAGES/DRESSINGS) ×1 IMPLANT
GAUZE SPONGE 4X4 12PLY STRL LF (GAUZE/BANDAGES/DRESSINGS) IMPLANT
GLOVE BIO SURGEON STRL SZ7 (GLOVE) ×1 IMPLANT
GLOVE BIOGEL PI IND STRL 7.0 (GLOVE) IMPLANT
GLOVE BIOGEL PI IND STRL 7.5 (GLOVE) ×1 IMPLANT
GLOVE SURG SYN 7.5 E (GLOVE) ×2
GLOVE SURG SYN 7.5 PF PI (GLOVE) IMPLANT
GOWN STRL REUS W/ TWL LRG LVL3 (GOWN DISPOSABLE) ×1 IMPLANT
GOWN STRL REUS W/ TWL XL LVL3 (GOWN DISPOSABLE) IMPLANT
KIT CVR 48X5XPRB PLUP LF (MISCELLANEOUS) IMPLANT
KIT PORT POWER 8FR ISP CVUE (Port) IMPLANT
NDL HYPO 25X1 1.5 SAFETY (NEEDLE) ×1 IMPLANT
NDL SAFETY ECLIPSE 18X1.5 (NEEDLE) IMPLANT
NDL SPNL 22GX3.5 QUINCKE BK (NEEDLE) IMPLANT
NEEDLE HYPO 25X1 1.5 SAFETY (NEEDLE) ×1
NEEDLE SPNL 22GX3.5 QUINCKE BK (NEEDLE)
PACK BASIN DAY SURGERY FS (CUSTOM PROCEDURE TRAY) ×1 IMPLANT
PENCIL SMOKE EVACUATOR (MISCELLANEOUS) ×1 IMPLANT
SLEEVE SCD COMPRESS KNEE MED (STOCKING) ×1 IMPLANT
SPIKE FLUID TRANSFER (MISCELLANEOUS) ×1 IMPLANT
STRIP CLOSURE SKIN 1/2X4 (GAUZE/BANDAGES/DRESSINGS) ×1 IMPLANT
SUT MON AB 4-0 PC3 18 (SUTURE) ×1 IMPLANT
SUT PROLENE 2 0 CT2 30 (SUTURE) ×1 IMPLANT
SUT VIC AB 3-0 SH 27X BRD (SUTURE) ×1 IMPLANT
SYR 10ML LL (SYRINGE) IMPLANT
SYR 5ML LUER SLIP (SYRINGE) ×1 IMPLANT
SYR CONTROL 10ML LL (SYRINGE) ×1 IMPLANT
TOWEL GREEN STERILE FF (TOWEL DISPOSABLE) ×1 IMPLANT
TUBE CONNECTING 20X1/4 (TUBING) IMPLANT
YANKAUER SUCT BULB TIP NO VENT (SUCTIONS) IMPLANT

## 2023-09-15 NOTE — Interval H&P Note (Signed)
 History and Physical Interval Note:  09/15/2023 9:59 AM  Martha Washington  has presented today for surgery, with the diagnosis of LEFT BREAST INVASIVE DUCTAL CARCINOMA.  The various methods of treatment have been discussed with the patient and family. After consideration of risks, benefits and other options for treatment, the patient has consented to  Procedure(s) with comments: INSERTION PORT-A-CATH WITH ULTASOUND GUIDANCE (N/A) - Leave port accessed as a surgical intervention.  The patient's history has been reviewed, patient examined, no change in status, stable for surgery.  I have reviewed the patient's chart and labs.  Questions were answered to the patient's satisfaction.     Donnice MARLA Lima

## 2023-09-15 NOTE — Discharge Instructions (Addendum)
 PORT-A-CATH: POST OP INSTRUCTIONS  Always review your discharge instruction sheet given to you by the facility where your surgery was performed.   A prescription for pain medication may be given to you upon discharge. Take your pain medication as prescribed, if needed. If narcotic pain medicine is not needed, then you make take acetaminophen  (Tylenol ) or ibuprofen  (Advil ) as needed.  Take your usually prescribed medications unless otherwise directed. If you need a refill on your pain medication, please contact our office. All narcotic pain medicine now requires a paper prescription.  Phoned in and fax refills are no longer allowed by law.  Prescriptions will not be filled after 5 pm or on weekends.  You should follow a light diet for the remainder of the day after your procedure. Most patients will experience some mild swelling and/or bruising in the area of the incision. It may take several days to resolve. It is common to experience some constipation if taking pain medication after surgery. Increasing fluid intake and taking a stool softener (such as Colace) will usually help or prevent this problem from occurring. A mild laxative (Milk of Magnesia or Miralax ) should be taken according to package directions if there are no bowel movements after 48 hours.  Unless discharge instructions indicate otherwise, you may remove your bandages 48 hours after surgery, and you may shower at that time. You may have steri-strips (small white skin tapes) in place directly over the incision.  These strips should be left on the skin for 7-10 days.   If your port is left accessed at the end of surgery (needle left in port), the dressing cannot get wet and should only by changed by a healthcare professional. When the port is no longer accessed (when the needle has been removed), follow step 7.   ACTIVITIES:  Limit activity involving your arms for the next 72 hours. Do no strenuous exercise or activity for 1 week.  You may drive when you are no longer taking prescription pain medication, you can comfortably wear a seatbelt, and you can maneuver your car. 10.You may need to see your doctor in the office for a follow-up appointment.  Please       check with your doctor.  11.When you receive a new Port-a-Cath, you will get a product guide and        ID card.  Please keep them in case you need them.  WHEN TO CALL YOUR DOCTOR [phone]): Fever over 101.0 Chills Continued bleeding from incision Increased redness and tenderness at the site Shortness of breath, difficulty breathing   The clinic staff is available to answer your questions during regular business hours. Please don't hesitate to call and ask to speak to one of the nurses or medical assistants for clinical concerns. If you have a medical emergency, go to the nearest emergency room or call 911.  A surgeon from [facility] is always on call at the hospital.      Post Anesthesia Home Care Instructions  Activity: Get plenty of rest for the remainder of the day. A responsible individual must stay with you for 24 hours following the procedure.  For the next 24 hours, DO NOT: -Drive a car -Advertising copywriter -Drink alcoholic beverages -Take any medication unless instructed by your physician -Make any legal decisions or sign important papers.  Meals: Start with liquid foods such as gelatin or soup. Progress to regular foods as tolerated. Avoid greasy, spicy, heavy foods. If nausea and/or vomiting occur,  drink only clear liquids until the nausea and/or vomiting subsides. Call your physician if vomiting continues.  Special Instructions/Symptoms: Your throat may feel dry or sore from the anesthesia or the breathing tube placed in your throat during surgery. If this causes discomfort, gargle with warm salt water . The discomfort should disappear within 24 hours.  Tylenol  can be taken after 3 pm if needed

## 2023-09-15 NOTE — Anesthesia Procedure Notes (Signed)
 Procedure Name: LMA Insertion Date/Time: 09/15/2023 10:42 AM  Performed by: Julieanne Fairy BROCKS, CRNAPre-anesthesia Checklist: Patient identified, Emergency Drugs available, Suction available and Patient being monitored Patient Re-evaluated:Patient Re-evaluated prior to induction Oxygen Delivery Method: Circle system utilized Preoxygenation: Pre-oxygenation with 100% oxygen Induction Type: IV induction Ventilation: Mask ventilation without difficulty LMA: LMA inserted LMA Size: 4.0 Number of attempts: 1 Airway Equipment and Method: Bite block Placement Confirmation: positive ETCO2 Tube secured with: Tape Dental Injury: Teeth and Oropharynx as per pre-operative assessment

## 2023-09-15 NOTE — Transfer of Care (Signed)
 Immediate Anesthesia Transfer of Care Note  Patient: Martha Washington  Procedure(s) Performed: INSERTION PORT-A-CATH WITH ULTASOUND GUIDANCE (Right: Chest)  Patient Location: PACU  Anesthesia Type:General  Level of Consciousness: awake and patient cooperative  Airway & Oxygen Therapy: Patient Spontanous Breathing and Patient connected to face mask oxygen  Post-op Assessment: Report given to RN and Post -op Vital signs reviewed and stable  Post vital signs: Reviewed and stable  Last Vitals:  Vitals Value Taken Time  BP 113/61 09/15/23 1132  Temp 36.2 C 09/15/23 1132  Pulse 75 09/15/23 1137  Resp 16 09/15/23 1137  SpO2 98 % 09/15/23 1137  Vitals shown include unfiled device data.  Last Pain:  Vitals:   09/15/23 0905  TempSrc: Temporal  PainSc: 0-No pain         Complications: No notable events documented.

## 2023-09-15 NOTE — Op Note (Signed)
 Preop diagnosis: left breast invasive ductal carcinoma Postop diagnosis: Same Procedure performed: Ultrasound guided right internal jugular vein port placement Surgeon:Crissie Aloi K Gardner Servantes Anesthesia: General via LMA Indications: This is a 58 year old female who recently underwent screening mammogram.  She was recalled for calcifications in the left upper outer quadrant.  She underwent further examination including diagnostic mammogram and ultrasound.  This revealed a 3.2 x 2.2 x 2.9 cm area of calcifications.  Ultrasound showed an irregular hypoechoic mass located at 2:00, 9 cm from the nipple.  This measured 2.1 x 1.6 x 1.6 cm.  There is a satellite mass approximately 2 cm away located at 2:00, 7 cm from the nipple.  This measures 0.4 x 0.3 x 0.2 cm.  Ultrasound of the axilla was negative.   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  She presents now for port placement for neoadjuvant chemotherapy.  Description of procedure: The patient is brought to the operating room placed in the supine position on the operating table.  After an adequate level of general anesthesia was obtained, the patient right arm was tucked at her side.  Her right chest and neck were prepped with ChloraPrep and draped sterile fashion.  A timeout was taken to ensure the proper patient and proper procedure.  She was placed in Trendelenburg position.  We interrogated her neck with the ultrasound.  The jugular vein is easily identified.  Using ultrasound guidance we directly cannulated the internal jugular vein with good blood return.  The wire passed easily.  Fluoroscopy confirmed that the wire headed down the right side of the mediastinum.  The needle was removed.  We created a subcutaneous pocket below the right clavicle.  We first anesthetized with local anesthetic.  We created a subcutaneous tunnel from the subcutaneous pocket to the insertion site on the neck.  An 8  [place] port was assembled and was tunneled from the subcutaneous pocket to the insertion site.  The catheter was cut to the appropriate length using fluoroscopic guidance.  Using fluoroscopic guidance, we passed the dilator and breakaway sheath over the wire.  The wire and dilator were removed.  The catheter was then advanced through the sheath which was removed.  Fluoroscopy confirmed that there were no kinks along the length of the catheter.  The tip of the catheter is at the cavoatrial junction.  We are able to aspirate blood easily through the port and were able to flush easily.  The port was secured with two interrupted 2-0 Prolene sutures.  3-0 Vicryl was used to close the subcutaneous tissue and 4-0 Monocryl was used to close the skin at both sites.  Benzoin and Steri-Strips were applied.  We accessed the port and secured the needle/ tubing to the skin with Tegaderm.   An occlusive dressing was placed.  The patient was then extubated and brought to the recovery room in stable condition.  All sponge, instrument, and needle counts are correct.  Post-op chest x-ray is pending.  Martha Washington. Belinda, MD, [facility]  General Surgery   09/15/2023 11:29 AM

## 2023-09-15 NOTE — Anesthesia Preprocedure Evaluation (Signed)
 Anesthesia Evaluation  Patient identified by MRN, date of birth, ID band Patient awake    Reviewed: Allergy & Precautions, NPO status , Patient's Chart, lab work & pertinent test results, reviewed documented beta blocker date and time   History of Anesthesia Complications Negative for: history of anesthetic complications  Airway Mallampati: II  TM Distance: >3 FB Neck ROM: Full    Dental no notable dental hx.    Pulmonary neg COPD, neg PE   breath sounds clear to auscultation       Cardiovascular (-) hypertension(-) Past MI and (-) Cardiac Stents + dysrhythmias Atrial Fibrillation  Rhythm:Regular Rate:Normal     Neuro/Psych neg Seizures    GI/Hepatic ,GERD  ,,(+) neg Cirrhosis        Endo/Other    Renal/GU Renal disease     Musculoskeletal   Abdominal   Peds  Hematology   Anesthesia Other Findings   Reproductive/Obstetrics                              Anesthesia Physical Anesthesia Plan  ASA: 2  Anesthesia Plan: General   Post-op Pain Management:    Induction: Intravenous  PONV Risk Score and Plan: 2 and Ondansetron  and Dexamethasone   Airway Management Planned: LMA  Additional Equipment:   Intra-op Plan:   Post-operative Plan: Extubation in OR  Informed Consent: I have reviewed the patients History and Physical, chart, labs and discussed the procedure including the risks, benefits and alternatives for the proposed anesthesia with the patient or authorized representative who has indicated his/her understanding and acceptance.     Dental advisory given  Plan Discussed with: CRNA  Anesthesia Plan Comments:          Anesthesia Quick Evaluation

## 2023-09-15 NOTE — Anesthesia Postprocedure Evaluation (Signed)
 Anesthesia Post Note  Patient: Martha Washington  Procedure(s) Performed: INSERTION PORT-A-CATH WITH ULTASOUND GUIDANCE (Right: Chest)     Patient location during evaluation: PACU Anesthesia Type: General Level of consciousness: awake and alert Pain management: pain level controlled Vital Signs Assessment: post-procedure vital signs reviewed and stable Respiratory status: spontaneous breathing, nonlabored ventilation, respiratory function stable and patient connected to nasal cannula oxygen Cardiovascular status: blood pressure returned to baseline and stable Postop Assessment: no apparent nausea or vomiting Anesthetic complications: no   No notable events documented.  Last Vitals:  Vitals:   09/15/23 1200 09/15/23 1217  BP: 110/63 121/71  Pulse: 63 62  Resp: 18 16  Temp:  (!) 36.4 C  SpO2: 95% 96%    Last Pain:  Vitals:   09/15/23 1200  TempSrc:   PainSc: 2                  Lynwood MARLA Cornea

## 2023-09-16 ENCOUNTER — Other Ambulatory Visit: Payer: 59

## 2023-09-16 ENCOUNTER — Inpatient Hospital Stay: Payer: 59

## 2023-09-16 ENCOUNTER — Inpatient Hospital Stay (HOSPITAL_BASED_OUTPATIENT_CLINIC_OR_DEPARTMENT_OTHER): Payer: 59 | Admitting: Hematology and Oncology

## 2023-09-16 ENCOUNTER — Encounter (HOSPITAL_BASED_OUTPATIENT_CLINIC_OR_DEPARTMENT_OTHER): Payer: Self-pay | Admitting: Surgery

## 2023-09-16 VITALS — BP 121/73 | HR 64 | Temp 97.2°F | Resp 16 | Wt 193.2 lb

## 2023-09-16 VITALS — BP 113/57 | HR 60 | Resp 16

## 2023-09-16 DIAGNOSIS — Z170 Estrogen receptor positive status [ER+]: Secondary | ICD-10-CM

## 2023-09-16 DIAGNOSIS — C50412 Malignant neoplasm of upper-outer quadrant of left female breast: Secondary | ICD-10-CM

## 2023-09-16 MED ORDER — SODIUM CHLORIDE 0.9 % IV SOLN
150.0000 mg | Freq: Once | INTRAVENOUS | Status: AC
  Administered 2023-09-16: 150 mg via INTRAVENOUS
  Filled 2023-09-16: qty 150

## 2023-09-16 MED ORDER — ACETAMINOPHEN 325 MG PO TABS
650.0000 mg | ORAL_TABLET | Freq: Once | ORAL | Status: AC
  Administered 2023-09-16: 650 mg via ORAL
  Filled 2023-09-16: qty 2

## 2023-09-16 MED ORDER — SODIUM CHLORIDE 0.9 % IV SOLN: INTRAVENOUS | Status: DC

## 2023-09-16 MED ORDER — SODIUM CHLORIDE 0.9% FLUSH
10.0000 mL | INTRAVENOUS | Status: DC | PRN
  Administered 2023-09-16: 10 mL

## 2023-09-16 MED ORDER — SODIUM CHLORIDE 0.9 % IV SOLN
661.0000 mg | Freq: Once | INTRAVENOUS | Status: AC
  Administered 2023-09-16: 660 mg via INTRAVENOUS
  Filled 2023-09-16: qty 66

## 2023-09-16 MED ORDER — DEXAMETHASONE SODIUM PHOSPHATE 10 MG/ML IJ SOLN
10.0000 mg | Freq: Once | INTRAMUSCULAR | Status: AC
  Administered 2023-09-16: 10 mg via INTRAVENOUS
  Filled 2023-09-16: qty 1

## 2023-09-16 MED ORDER — DIPHENHYDRAMINE HCL 25 MG PO CAPS
50.0000 mg | ORAL_CAPSULE | Freq: Once | ORAL | Status: AC
  Administered 2023-09-16: 50 mg via ORAL
  Filled 2023-09-16: qty 2

## 2023-09-16 MED ORDER — PALONOSETRON HCL INJECTION 0.25 MG/5ML
0.2500 mg | Freq: Once | INTRAVENOUS | Status: AC
  Administered 2023-09-16: 0.25 mg via INTRAVENOUS
  Filled 2023-09-16: qty 5

## 2023-09-16 MED ORDER — SODIUM CHLORIDE 0.9 % IV SOLN
840.0000 mg | Freq: Once | INTRAVENOUS | Status: AC
  Administered 2023-09-16: 840 mg via INTRAVENOUS
  Filled 2023-09-16: qty 28

## 2023-09-16 MED ORDER — HEPARIN SOD (PORK) LOCK FLUSH 100 UNIT/ML IV SOLN
500.0000 [IU] | Freq: Once | INTRAVENOUS | Status: AC | PRN
  Administered 2023-09-16: 500 [IU]

## 2023-09-16 MED ORDER — SODIUM CHLORIDE 0.9 % IV SOLN
60.0000 mg/m2 | Freq: Once | INTRAVENOUS | Status: AC
  Administered 2023-09-16: 119 mg via INTRAVENOUS
  Filled 2023-09-16: qty 11.9

## 2023-09-16 MED ORDER — TRASTUZUMAB-ANNS CHEMO 420 MG IV SOLR
8.0000 mg/kg | Freq: Once | INTRAVENOUS | Status: AC
  Administered 2023-09-16: 693 mg via INTRAVENOUS
  Filled 2023-09-16: qty 33

## 2023-09-16 NOTE — Progress Notes (Signed)
 [facility] CONSULT NOTE  Patient Care Team: Claudene Lacks, MD as PCP - General (Family Medicine) Sheena Pugh, DO as PCP - Cardiology (Cardiology) Glean Stephane BROCKS, RN as Oncology Nurse Navigator Tyree Nanetta SAILOR, RN as Oncology Nurse Navigator Loretha Ash, MD as Consulting Physician (Hematology and Oncology) Belinda Cough, MD as Consulting Physician (General Surgery) Izell Domino, MD as Attending Physician (Radiation Oncology)  CHIEF COMPLAINTS/PURPOSE OF CONSULTATION:  Newly diagnosed breast cancer  HISTORY OF PRESENTING ILLNESS:  Martha Washington 58 y.o. female is here because of recent diagnosis of left breast cancer  I reviewed her records extensively and collaborated the history with the patient.  SUMMARY OF ONCOLOGIC HISTORY: Oncology History  Malignant neoplasm of upper-outer quadrant of left breast in female, estrogen receptor positive (HCC)  08/04/2023 Mammogram   Further evaluation is suggested for calcifications in the left breast.  There is a group of coarse heterogeneous calcifications associated with an asymmetry in the UPPER OUTER QUADRANT at posterior depth spanning approximately 3.2 x 2.2 x 2.9 cm (AP x transverse x CC). There are linear and branching forms within the large group. There is a possible mass at the posterior margin of the calcifications measuring just under 1 cm in size.   Targeted ultrasound is performed, demonstrating an irregular hypoechoic mass with angular margins and associated calcifications at 2 o'clock 9 cm from the nipple measuring approximately 2.1 x 1.6 x 1.6 cm, demonstrating posterior acoustic shadowing and demonstrating internal power Doppler flow. There is a 0.4 x 0.3 x 0.2 cm satellite mass at 2 o'clock 7 cm from the nipple which is approximately 2 cm removed from the dominant mass.     08/16/2023 Pathology Results     08/22/2023 Initial Diagnosis   Malignant neoplasm of upper-outer quadrant of left breast  in female, estrogen receptor positive (HCC)   08/24/2023 Cancer Staging   Staging form: Breast, AJCC 8th Edition - Clinical stage from 08/24/2023: Stage IB (cT2, cN0, cM0, G2, ER+, PR+, HER2+) - Signed by Loretha Ash, MD on 08/24/2023 Stage prefix: Initial diagnosis Histologic grading system: 3 grade system Laterality: Left Staged by: Pathologist and managing physician Stage used in treatment planning: Yes National guidelines used in treatment planning: Yes Type of national guideline used in treatment planning: NCCN   08/31/2023 Genetic Testing   Negative [facility] CancerNext+RNAinsight Panel.  Report date is 08/31/2023.   The [employer] CancerNext+RNAinsight Panel includes sequencing, rearrangement analysis, and RNA analysis for the following 39 genes: APC, ATM, BAP1, BARD1, BMPR1A, BRCA1, BRCA2, BRIP1, CDH1, CDKN2A, CHEK2, FH, FLCN, MET, MLH1, MSH2, MSH6, MUTYH, NF1, NTHL1, PALB2, PMS2, PTEN, RAD51C, RAD51D, SMAD4, STK11, TP53, TSC1, TSC2, and VHL (sequencing and deletion/duplication); AXIN2, HOXB13, MBD4, MSH3, POLD1 and POLE (sequencing only); EPCAM and GREM1 (deletion/duplication only).    09/16/2023 -  Chemotherapy   Patient is on Treatment Plan : BREAST  Docetaxel  + Carboplatin  + Trastuzumab  + Pertuzumab   (TCHP) q21d       Discussed the use of AI scribe software for clinical note transcription with the patient, who gave verbal consent to proceed.  History of Present Illness         Martha Washington is a 58 year old female with breast cancer who presents for chemotherapy treatment.  She is currently undergoing chemotherapy for breast cancer. A port was placed yesterday, and a recent biopsy of another area was benign. She has no symptoms such as fever, chills, or diarrhea and maintains adequate nutrition and hydration.  She has recovered  from flu.  Her recent blood work indicated elevated BUN levels, which she attributes to a high protein diet, consuming 100-115 grams of protein daily.  During her recent illness, she focused on protein intake, possibly neglecting other nutrients. She has eliminated processed foods for the past three and a half months, noticing positive changes, and is diligent about washing fruits and vegetables thoroughly.  She has not received the flu vaccine this yr. She is ready for her chemotherapy today. Rest of the pertinent 10 point ROS reviewed and neg.  MEDICAL HISTORY:  Past Medical History:  Diagnosis Date   Atrial fibrillation with RVR (HCC)    GERD (gastroesophageal reflux disease)    no meds   High cholesterol    Menorrhagia 07/13/2013   Missed abortion    resolved - no surgery required   New onset atrial fibrillation (HCC) 08/04/2017   with RVR to the 160s; S/P hammertoe OR/notes 08/04/2017   S/P bunionectomy 08/04/2017   SVD (spontaneous vaginal delivery) X 2   Tachycardia 08/04/2017    SURGICAL HISTORY: Past Surgical History:  Procedure Laterality Date   ABDOMINAL HYSTERECTOMY     BACK SURGERY     BREAST BIOPSY Left 08/16/2023   US  LT BREAST BX W LOC DEV 1ST LESION IMG BX SPEC US  GUIDE 08/16/2023 [facility]MO[facility]   BREAST BIOPSY Left 09/12/2023   US  LT BREAST BX W LOC DEV 1ST LESION IMG BX SPEC US  GUIDE 09/12/2023 [facility] MAMMOGR[facility]   BUNIONECTOMY WITH HAMMERTOE RECONSTRUCTION Right 03/2017   BUNIONECTOMY WITH HAMMERTOE RECONSTRUCTION Left 08/04/2017   BUNIONECTOMY WITH HAMMERTOE RECONSTRUCTION Left 08/04/2017   Procedure: Left modified McBride and Lapidus bunion corrections; Left 2-3 Weil and hammertoe corrections;  Surgeon: Kit Rush, MD;  Location: [facility];  Service: Orthopedics;  Laterality: Left;   ROBOTIC ASSISTED TOTAL HYSTERECTOMY WITH BILATERAL SALPINGO OOPHERECTOMY N/A 07/13/2013   Procedure: ROBOTIC ASSISTED TOTAL HYSTERECTOMY WITH BILATERAL SALPINGECTOMY;  Surgeon: Nena DELENA App, MD;  Location: [facility];  Service: Gynecology;  Laterality: N/A;   SHOULDER ARTHROSCOPY W/ ROTATOR CUFF REPAIR Right 2010    SHOULDER ARTHROSCOPY WITH ROTATOR CUFF REPAIR Right 10/17/2014   Procedure: SHOULDER ARTHROSCOPY WITH ROTATOR CUFF REPAIR;  Surgeon: Toribio Chancy, MD;  Location: [facility];  Service: Orthopedics;  Laterality: Right;   TUBAL LIGATION     WEIL OSTEOTOMY Left 08/04/2017   Procedure: Left 2nd and 3rd weil osteotomy;  Surgeon: Kit Rush, MD;  Location: [facility];  Service: Orthopedics;  Laterality: Left;   WISDOM TOOTH EXTRACTION      SOCIAL HISTORY: Social History   Socioeconomic History   Marital status: Married    Spouse name: Oneil   Number of children: Not on file   Years of education: Not on file   Highest education level: Not on file  Occupational History   Not on file  Tobacco Use   Smoking status: Never   Smokeless tobacco: Never  Vaping Use   Vaping status: Never Used  Substance and Sexual Activity   Alcohol use: No   Drug use: No   Sexual activity: Not on file  Other Topics Concern   Not on file  Social History Narrative   Lives with husband   Right Handed   Drinks 1 cup caffeine daily   Social Drivers of Corporate Investment Banker Strain: Not on file  Food Insecurity: Not on file  Transportation Needs: Not on file  Physical Activity: Not on file  Stress: Not  on file  Social Connections: Unknown (12/22/2021)   Received [facility]   Social Network    Social Network: Not on file  Intimate Partner Violence: Unknown (11/13/2021)   Received from [facility]   HITS    Physically Hurt: Not on file    Insult or Talk Down To: Not on file    Threaten Physical Harm: Not on file    Scream or Curse: Not on file    FAMILY HISTORY: Family History  Problem Relation Age of Onset   Hypertension Mother    Hypertension Father    Bone cancer Brother 10       or other primary?    ALLERGIES:  is allergic to azithromycin, morphine and codeine, and oxycodone .  MEDICATIONS:  Current Outpatient  Medications  Medication Sig Dispense Refill   Ascorbic Acid (VITAMIN C) 1000 MG tablet Take 1,000 mg by mouth daily.     b complex vitamins capsule Take 1 capsule by mouth daily.     dexamethasone  (DECADRON ) 4 MG tablet Take 2 tabs by mouth 2 times daily starting day before chemo. Then take 2 tabs daily for 2 days starting day after chemo. Take with food. 30 tablet 1   escitalopram  (LEXAPRO ) 10 MG tablet Take 1 tablet (10 mg total) by mouth daily. 90 tablet 0   lidocaine -prilocaine  (EMLA ) cream Apply to affected area once 30 g 3   ondansetron  (ZOFRAN ) 8 MG tablet Take 1 tablet (8 mg total) by mouth every 8 (eight) hours as needed for nausea or vomiting. Start on the third day after chemotherapy. 30 tablet 1   prochlorperazine  (COMPAZINE ) 10 MG tablet Take 1 tablet (10 mg total) by mouth every 6 (six) hours as needed for nausea or vomiting. 30 tablet 1   VITAMIN D PO Take by mouth.     ZINC CITRATE PO Take by mouth.     No current facility-administered medications for this visit.    REVIEW OF SYSTEMS:   Constitutional: Denies fevers, chills or abnormal night sweats Eyes: Denies blurriness of vision, double vision or watery eyes Ears, nose, mouth, throat, and face: Denies mucositis or sore throat Respiratory: Denies cough, dyspnea or wheezes Cardiovascular: Denies palpitation, chest discomfort or lower extremity swelling Gastrointestinal:  Denies nausea, heartburn or change in bowel habits Skin: Denies abnormal skin rashes Lymphatics: Denies new lymphadenopathy or easy bruising Neurological:Denies numbness, tingling or new weaknesses Behavioral/Psych: Mood is stable, no new changes  Breast: Denies any palpable lumps or discharge All other systems were reviewed with the patient and are negative.  PHYSICAL EXAMINATION: ECOG PERFORMANCE STATUS: 0 - Asymptomatic  Vitals:   09/16/23 0825  BP: 121/73  Pulse: 64  Resp: 16  Temp: (!) 97.2 F (36.2 C)  SpO2: 98%    Filed Weights    09/16/23 0825  Weight: 193 lb 3.2 oz (87.6 kg)     GENERAL:alert, no distress and comfortable SKIN: skin color, texture, turgor are normal, no rashes or significant lesions Chest: CTA bilaterally Heart: RRR Abdomen: soft, non tender, non distended.  LABORATORY DATA:  I have reviewed the data as listed Lab Results  Component Value Date   WBC 5.5 09/12/2023   HGB 13.8 09/12/2023   HCT 43.6 09/12/2023   MCV 87.0 09/12/2023   PLT 291 09/12/2023   Lab Results  Component Value Date   NA 141 09/12/2023   K 4.0 09/12/2023   CL 106 09/12/2023   CO2 29 09/12/2023  RADIOGRAPHIC STUDIES: I have personally reviewed the radiological reports and agreed with the findings in the report.  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 The cancer is localized to the breast with no palpable lymph nodes. Discussed the importance of chemotherapy prior to surgery for potential tumor shrinkage, prognostic information, and to adjust post-surgical treatment if necessary. -Plan to initiate TCHP chemotherapy regimen (Carboplatin , Docetaxel , Herceptin , Perjeta ) for six cycles, every 21 days.  Breast Cancer Port placement and biopsy completed. Biopsy results benign. Patient recently recovered from the flu. No current symptoms reported. Plan to start chemotherapy regimen, with the goal of achieving a complete clinical response. -Reduce initial chemotherapy dose due to recent flu recovery. -Monitor for side effects and adjust treatment as necessary. -Plan to drop pertuzumab  first regimen becomes too difficult for the patient.  Influenza -Discussed benefits of flu vaccination, especially considering recent severe flu episode.  Nutrition Patient reports high  protein intake and avoidance of processed foods. Recent BUN elevation possibly due to high protein intake or dehydration during recent flu illness. -Encourage continued protein intake for healing during cancer treatment and hydration. -Monitor BUN levels.  General Health Maintenance -Encourage patient to consider flu vaccination. -Encourage patient to report any changes in health status, especially inability to maintain oral hydration or eat well, or development of diarrhea.  Time spent: . All questions were answered. The patient knows to call the clinic with any problems, questions or concerns.    Amber Stalls, MD 09/16/23

## 2023-09-16 NOTE — Assessment & Plan Note (Addendum)
 Invasive Ductal Carcinoma, Left Breast Newly diagnosed, HER2 positive, estrogen receptor positive, with a primary mass measuring 2.1 cm and a satellite nodule of 4 mm. The cancer is localized to the breast with no palpable lymph nodes. Discussed the importance of chemotherapy prior to surgery for potential tumor shrinkage, prognostic information, and to adjust post-surgical treatment if necessary. -Plan to initiate TCHP chemotherapy regimen (Carboplatin , Docetaxel , Herceptin , Perjeta ) for six cycles, every 21 days. Baseline ECHO satisfactory.  Breast Cancer Port placement and biopsy completed. Biopsy results benign. Patient recently recovered from the flu. No current symptoms reported. Plan to start chemotherapy regimen, with the goal of achieving a complete clinical response. -Reduce initial chemotherapy dose due to recent flu recovery. -Monitor for side effects and adjust treatment as necessary. -Plan to drop pertuzumab  first regimen becomes too difficult for the patient.  Influenza -Discussed benefits of flu vaccination, especially considering recent severe flu episode.  Nutrition Patient reports high protein intake and avoidance of processed foods. Recent BUN elevation possibly due to high protein intake or dehydration during recent flu illness. -Encourage continued protein intake for healing during cancer treatment and hydration. -Monitor BUN levels.  General Health Maintenance -Encourage patient to consider flu vaccination. -Encourage patient to report any changes in health status, especially inability to maintain oral hydration or eat well, or development of diarrhea.

## 2023-09-16 NOTE — Patient Instructions (Signed)
 [facility] - A DEPT OF [facility]  Discharge Instructions: Thank you for choosing [facility] to provide your oncology and hematology care.   If you have a lab appointment with the Cancer Center, please go directly to the Cancer Center and check in at the registration area.   Wear comfortable clothing and clothing appropriate for easy access to any Portacath or PICC line.   We strive to give you quality time with your provider. You may need to reschedule your appointment if you arrive late (15 or more minutes).  Arriving late affects you and other patients whose appointments are after yours.  Also, if you miss three or more appointments without notifying the office, you may be dismissed from the clinic at the provider's discretion.      For prescription refill requests, have your pharmacy contact our office and allow 72 hours for refills to be completed.    Today you received the following chemotherapy and/or immunotherapy agents Trastuzumab (Kanjinti), Pertuzumab (Perjeta), Docetaxel (Taxotere), Carboplatin      To help prevent nausea and vomiting after your treatment, we encourage you to take your nausea medication as directed.  BELOW ARE SYMPTOMS THAT SHOULD BE REPORTED IMMEDIATELY: *FEVER GREATER THAN 100.4 F (38 C) OR HIGHER *CHILLS OR SWEATING *NAUSEA AND VOMITING THAT IS NOT CONTROLLED WITH YOUR NAUSEA MEDICATION *UNUSUAL SHORTNESS OF BREATH *UNUSUAL BRUISING OR BLEEDING *URINARY PROBLEMS (pain or burning when urinating, or frequent urination) *BOWEL PROBLEMS (unusual diarrhea, constipation, pain near the anus) TENDERNESS IN MOUTH AND THROAT WITH OR WITHOUT PRESENCE OF ULCERS (sore throat, sores in mouth, or a toothache) UNUSUAL RASH, SWELLING OR PAIN  UNUSUAL VAGINAL DISCHARGE OR ITCHING   Items with * indicate a potential emergency and should be followed up as soon as possible or go to the Emergency Department if any problems  should occur.  Please show the CHEMOTHERAPY ALERT CARD or IMMUNOTHERAPY ALERT CARD at check-in to the Emergency Department and triage nurse.  Should you have questions after your visit or need to cancel or reschedule your appointment, please contact [facility] - A DEPT OF [facility]  Dept: [phone]  and follow the prompts.  Office hours are 8:00 a.m. to 4:30 p.m. Monday - Friday. Please note that voicemails left after 4:00 p.m. may not be returned until the following business day.  We are closed weekends and major holidays. You have access to a nurse at all times for urgent questions. Please call the main number to the clinic Dept: [phone] and follow the prompts.   For any non-urgent questions, you may also contact your provider using MyChart. We now offer e-Visits for anyone 61 and older to request care online for non-urgent symptoms. For details visit [email or web address].   Also download the MyChart app! Go to the app store, search "MyChart", open the app, select , and log in with your MyChart username and password.

## 2023-09-19 ENCOUNTER — Inpatient Hospital Stay: Payer: 59

## 2023-09-19 ENCOUNTER — Telehealth: Payer: Self-pay

## 2023-09-19 VITALS — BP 105/72 | HR 75 | Temp 98.9°F | Resp 16

## 2023-09-19 DIAGNOSIS — C50412 Malignant neoplasm of upper-outer quadrant of left female breast: Secondary | ICD-10-CM | POA: Diagnosis not present

## 2023-09-19 DIAGNOSIS — Z170 Estrogen receptor positive status [ER+]: Secondary | ICD-10-CM

## 2023-09-19 MED ORDER — PEGFILGRASTIM-CBQV 6 MG/0.6ML ~~LOC~~ SOSY
6.0000 mg | PREFILLED_SYRINGE | Freq: Once | SUBCUTANEOUS | Status: AC
  Administered 2023-09-19: 6 mg via SUBCUTANEOUS
  Filled 2023-09-19: qty 0.6

## 2023-09-19 NOTE — Telephone Encounter (Signed)
-----   Message from Nurse Cathren Coaster sent at 09/16/2023  3:54 PM EST ----- Regarding: First time [facility]. Pt of [place]. First time TCHP, tolerated well. Pt of [place]. Pt with new port placement on 09/15/23. Please check in with patient. Thanks!

## 2023-09-19 NOTE — Telephone Encounter (Signed)
 Martha Washington states that she is doing fine.She has been tired the last couple of days. She has been doing her morning tredmill routine. She is eating, drinking, and urinating well. She knows to call the office at [phone] if  she has any questions or concerns.

## 2023-09-22 ENCOUNTER — Telehealth: Payer: Self-pay

## 2023-09-22 ENCOUNTER — Other Ambulatory Visit: Payer: Self-pay

## 2023-09-22 NOTE — Telephone Encounter (Signed)
Pt called and states she was constipated for several days, she has been taking colace and dulcolax for BM support. She is reporting now she has watery diarrhea and has vomited once today.  When asked if pt has taken prescribed antiemetic, she reports she just took it. Denies use of immodium. Denies fever/chills/body aches. Advised pt to try imodium as directed, but to be cautious because compazine can also cause constipation. She will call us back if sx persist even after medication intervention. Advised if this continues to call so we can make appt with [facility] for labs and possible fluids. She verbalized thanks and agreement.

## 2023-09-23 ENCOUNTER — Telehealth: Payer: Self-pay | Admitting: *Deleted

## 2023-09-23 ENCOUNTER — Encounter: Payer: Self-pay | Admitting: Hematology and Oncology

## 2023-09-23 ENCOUNTER — Other Ambulatory Visit: Payer: Self-pay | Admitting: *Deleted

## 2023-09-23 DIAGNOSIS — E860 Dehydration: Secondary | ICD-10-CM | POA: Insufficient documentation

## 2023-09-23 NOTE — Telephone Encounter (Signed)
This RN spoke with pt to follow up on her call late yesterday with nausea and diarrhea.  At the time of the call she felt she was doing fair.  She stated concern due to loss of 10lbs over 1 week per her scales.  Discussed above including primary concern is with re hydrating - discussed regimen of use of salt based broths and then real sugar fluid as well as water.  Discussed to not push foods "just to eat" due to liklihood that could induce vomiting but to go with foods she does not feel an aversion to.  She states she current wants potatoes.  This RN offered for her to come in for fluids tomorrow - with pt being mildly reluctant but willing if she continues to not feel like she is hydrating well.  Appt time given as well as phone number of the infusion room to contact if she feels improved and able to hydrate well.  Orders placed.

## 2023-09-24 ENCOUNTER — Inpatient Hospital Stay: Payer: 59

## 2023-09-24 VITALS — BP 123/82 | HR 94 | Temp 97.1°F | Resp 16

## 2023-09-24 DIAGNOSIS — C50412 Malignant neoplasm of upper-outer quadrant of left female breast: Secondary | ICD-10-CM | POA: Diagnosis not present

## 2023-09-24 DIAGNOSIS — E860 Dehydration: Secondary | ICD-10-CM

## 2023-09-24 MED ORDER — SODIUM CHLORIDE 0.9 % IV SOLN: Freq: Once | INTRAVENOUS | Status: DC

## 2023-09-24 MED ORDER — SODIUM CHLORIDE 0.9% FLUSH
10.0000 mL | Freq: Once | INTRAVENOUS | Status: AC | PRN
  Administered 2023-09-24: 10 mL

## 2023-09-24 MED ORDER — SODIUM CHLORIDE 0.9 % IV SOLN: Freq: Once | INTRAVENOUS | Status: AC

## 2023-09-24 MED ORDER — HEPARIN SOD (PORK) LOCK FLUSH 100 UNIT/ML IV SOLN
500.0000 [IU] | Freq: Once | INTRAVENOUS | Status: AC | PRN
  Administered 2023-09-24: 500 [IU]

## 2023-09-29 ENCOUNTER — Other Ambulatory Visit: Payer: 59

## 2023-09-29 ENCOUNTER — Ambulatory Visit: Payer: 59

## 2023-09-29 ENCOUNTER — Ambulatory Visit: Payer: 59 | Admitting: Hematology and Oncology

## 2023-10-01 ENCOUNTER — Other Ambulatory Visit: Payer: Self-pay | Admitting: Hematology and Oncology

## 2023-10-01 ENCOUNTER — Ambulatory Visit: Payer: 59

## 2023-10-06 MED FILL — Fosaprepitant Dimeglumine For IV Infusion 150 MG (Base Eq): INTRAVENOUS | Qty: 5 | Status: AC

## 2023-10-06 NOTE — Assessment & Plan Note (Signed)
 Invasive Ductal Carcinoma, Left Breast Newly diagnosed, HER2 positive, estrogen receptor positive, with a primary mass measuring 2.1 cm and a satellite nodule of 4 mm. On Neoadj TCHP. Baseline ECHO with EF of 65-70%.  Chemotherapy-induced Nausea and Vomiting Severe nausea and vomiting for 10 days post-infusion, despite taking Zofran and Compazine. Patient reported difficulty staying ahead of the nausea. -Add Zyprexa 2.5mg  nightly for anticipatory nausea and vomiting. -Continue alternating Zofran and Compazine during the day. -Encourage patient to stay ahead of the nausea by taking medications regularly.  Chemotherapy-induced Diarrhea Severe diarrhea post-infusion, improved with Imodium and hydration. -Continue Imodium as needed. -Schedule IV fluids at one week post each chemotherapy infusion to assist with hydration.  Chemotherapy-induced Anorexia and Weight Loss Significant weight loss and decreased appetite post-infusion, improved with IV fluids. -Schedule IV fluids at one week post each chemotherapy infusion to assist with hydration and weight maintenance.  Chemotherapy-induced Alopecia Patient reported significant hair loss. -Encourage patient to explore options for head coverings or wigs if desired.  Follow-up Blood counts recovered well post-treatment. Other blood work pending. -Continue to monitor blood counts and other labs as scheduled. -See patient as scheduled for next chemotherapy infusion.

## 2023-10-06 NOTE — Progress Notes (Unsigned)
 [facility] CONSULT NOTE  Patient Care Team: Lind Covert, MD as PCP - General (Family Medicine) Thomasene Ripple, DO as PCP - Cardiology (Cardiology) Pershing Proud, RN as Oncology Nurse Navigator Donnelly Angelica, RN as Oncology Nurse Navigator Rachel Moulds, MD as Consulting Physician (Hematology and Oncology) Manus Rudd, MD as Consulting Physician (General Surgery) Lonie Peak, MD as Attending Physician (Radiation Oncology)  CHIEF COMPLAINTS/PURPOSE OF CONSULTATION:  Newly diagnosed breast cancer  HISTORY OF PRESENTING ILLNESS:  Martha Washington 58 y.o. female is here because of recent diagnosis of left breast cancer  I reviewed her records extensively and collaborated the history with the patient.  SUMMARY OF ONCOLOGIC HISTORY: Oncology History  Malignant neoplasm of upper-outer quadrant of left breast in female, estrogen receptor positive (HCC)  08/04/2023 Mammogram   Further evaluation is suggested for calcifications in the left breast.  There is a group of coarse heterogeneous calcifications associated with an asymmetry in the UPPER OUTER QUADRANT at posterior depth spanning approximately 3.2 x 2.2 x 2.9 cm (AP x transverse x CC). There are linear and branching forms within the large group. There is a possible mass at the posterior margin of the calcifications measuring just under 1 cm in size.   Targeted ultrasound is performed, demonstrating an irregular hypoechoic mass with angular margins and associated calcifications at 2 o'clock 9 cm from the nipple measuring approximately 2.1 x 1.6 x 1.6 cm, demonstrating posterior acoustic shadowing and demonstrating internal power Doppler flow. There is a 0.4 x 0.3 x 0.2 cm satellite mass at 2 o'clock 7 cm from the nipple which is approximately 2 cm removed from the dominant mass.     08/16/2023 Pathology Results     08/22/2023 Initial Diagnosis   Malignant neoplasm of upper-outer quadrant of left breast  in female, estrogen receptor positive (HCC)   08/24/2023 Cancer Staging   Staging form: Breast, AJCC 8th Edition - Clinical stage from 08/24/2023: Stage IB (cT2, cN0, cM0, G2, ER+, PR+, HER2+) - Signed by Rachel Moulds, MD on 08/24/2023 Stage prefix: Initial diagnosis Histologic grading system: 3 grade system Laterality: Left Staged by: Pathologist and managing physician Stage used in treatment planning: Yes National guidelines used in treatment planning: Yes Type of national guideline used in treatment planning: NCCN   08/31/2023 Genetic Testing   Negative [employer] CancerNext+RNAinsight Panel.  Report date is 08/31/2023.   The [employer] CancerNext+RNAinsight Panel includes sequencing, rearrangement analysis, and RNA analysis for the following 39 genes: APC, ATM, BAP1, BARD1, BMPR1A, BRCA1, BRCA2, BRIP1, CDH1, CDKN2A, CHEK2, FH, FLCN, MET, MLH1, MSH2, MSH6, MUTYH, NF1, NTHL1, PALB2, PMS2, PTEN, RAD51C, RAD51D, SMAD4, STK11, TP53, TSC1, TSC2, and VHL (sequencing and deletion/duplication); AXIN2, HOXB13, MBD4, MSH3, POLD1 and POLE (sequencing only); EPCAM and GREM1 (deletion/duplication only).    09/16/2023 -  Chemotherapy   Patient is on Treatment Plan : BREAST  Docetaxel + Carboplatin + Trastuzumab + Pertuzumab  (TCHP) q21d       Discussed the use of AI scribe software for clinical note transcription with the patient, who gave verbal consent to proceed.  History of Present Illness    Martha Washington is a 58 year old female undergoing chemotherapy who presents with nausea, vomiting, and diarrhea.  She experiences significant nausea and vomiting following her recent chemotherapy treatment. Nausea was most severe during the middle week of her treatment cycle, lasting about ten days, but has improved in the last week. She uses Zofran and Compazine for nausea management, alternating them, with Compazine  taken at night. Despite these measures, she experienced persistent nausea and vomiting, including an  episode after eating a hamburger. She requires reminders to take her medications consistently.  She reports diarrhea occurring less than five times a day, which improved with the use of Imodium after a few days. Diarrhea was not present when she did not eat. She experienced a significant loss of appetite, unable to eat foods she usually enjoys, such as eggs. She experienced a weight loss of ten pounds within the first week post-treatment, necessitating intravenous fluids to regain her weight.  She experienced lethargy and was mostly in a recliner during the initial days post-treatment. Symptoms of nausea and reduced appetite worsened after receiving a shot on the Monday following her chemotherapy, coinciding with the cessation of a three-day Zofran IV regimen.  Hair loss started around fifteen days post-treatment, with significant shedding noted recently. She anticipates being bald soon and has discussed this with her family, who have been supportive.  Rest of the pertinent 10 point ROS reviewed and neg.  MEDICAL HISTORY:  Past Medical History:  Diagnosis Date   Atrial fibrillation with RVR (HCC)    GERD (gastroesophageal reflux disease)    no meds   High cholesterol    Menorrhagia 07/13/2013   Missed abortion    resolved - no surgery required   New onset atrial fibrillation (HCC) 08/04/2017   with RVR to the 160s; S/P hammertoe OR/notes 08/04/2017   S/P bunionectomy 08/04/2017   SVD (spontaneous vaginal delivery) X 2   Tachycardia 08/04/2017    SURGICAL HISTORY: Past Surgical History:  Procedure Laterality Date   ABDOMINAL HYSTERECTOMY     BACK SURGERY     BREAST BIOPSY Left 08/16/2023   [place] LT BREAST BX W LOC DEV 1ST LESION IMG BX SPEC US GUIDE 08/16/2023 [facility]   BREAST BIOPSY Left 09/12/2023   [place] LT BREAST BX W LOC DEV 1ST LESION IMG BX SPEC US GUIDE 09/12/2023 [facility]   BUNIONECTOMY WITH HAMMERTOE RECONSTRUCTION Right 03/2017   BUNIONECTOMY WITH HAMMERTOE  RECONSTRUCTION Left 08/04/2017   BUNIONECTOMY WITH HAMMERTOE RECONSTRUCTION Left 08/04/2017   Procedure: Left modified McBride and Lapidus bunion corrections; Left 2-3 Weil and hammertoe corrections;  Surgeon: Toni Arthurs, MD;  Location: [facility];  Service: Orthopedics;  Laterality: Left;   PORTACATH PLACEMENT Right 09/15/2023   Procedure: INSERTION PORT-A-CATH WITH ULTASOUND GUIDANCE;  Surgeon: Manus Rudd, MD;  Location: [facility];  Service: General;  Laterality: Right;  Leave port accessed   ROBOTIC ASSISTED TOTAL HYSTERECTOMY WITH BILATERAL SALPINGO OOPHERECTOMY N/A 07/13/2013   Procedure: ROBOTIC ASSISTED TOTAL HYSTERECTOMY WITH BILATERAL SALPINGECTOMY;  Surgeon: Esmeralda Arthur, MD;  Location: [facility];  Service: Gynecology;  Laterality: N/A;   SHOULDER ARTHROSCOPY W/ ROTATOR CUFF REPAIR Right 2010   SHOULDER ARTHROSCOPY WITH ROTATOR CUFF REPAIR Right 10/17/2014   Procedure: SHOULDER ARTHROSCOPY WITH ROTATOR CUFF REPAIR;  Surgeon: Mckinley Jewel, MD;  Location: [facility];  Service: Orthopedics;  Laterality: Right;   TUBAL LIGATION     WEIL OSTEOTOMY Left 08/04/2017   Procedure: Left 2nd and 3rd weil osteotomy;  Surgeon: Toni Arthurs, MD;  Location: [facility];  Service: Orthopedics;  Laterality: Left;   WISDOM TOOTH EXTRACTION      SOCIAL HISTORY: Social History   Socioeconomic History   Marital status: Married    Spouse name: Loraine Leriche   Number of children: Not on file   Years of education: Not on file  Highest education level: Not on file  Occupational History   Not on file  Tobacco Use   Smoking status: Never   Smokeless tobacco: Never  Vaping Use   Vaping status: Never Used  Substance and Sexual Activity   Alcohol use: No   Drug use: No   Sexual activity: Not on file  Other Topics Concern   Not on file  Social History Narrative   Lives with husband   Right Handed   Drinks 1 cup caffeine daily   Social  Drivers of Health   Financial Resource Strain: Not on file  Food Insecurity: Not on file  Transportation Needs: Not on file  Physical Activity: Not on file  Stress: Not on file  Social Connections: Unknown (12/22/2021)   Received from [facility]   Social Network    Social Network: Not on file  Intimate Partner Violence: Unknown (11/13/2021)   Received from [facility]   HITS    Physically Hurt: Not on file    Insult or Talk Down To: Not on file    Threaten Physical Harm: Not on file    Scream or Curse: Not on file    FAMILY HISTORY: Family History  Problem Relation Age of Onset   Hypertension Mother    Hypertension Father    Bone cancer Brother 58       or other primary?    ALLERGIES:  is allergic to azithromycin, morphine and codeine, and oxycodone.  MEDICATIONS:  Current Outpatient Medications  Medication Sig Dispense Refill   OLANZapine (ZYPREXA) 2.5 MG tablet Take 1 tablet (2.5 mg total) by mouth at bedtime. 30 tablet 1   Ascorbic Acid (VITAMIN C) 1000 MG tablet Take 1,000 mg by mouth daily.     b complex vitamins capsule Take 1 capsule by mouth daily.     dexamethasone (DECADRON) 4 MG tablet Take 2 tabs by mouth 2 times daily starting day before chemo. Then take 2 tabs daily for 2 days starting day after chemo. Take with food. 30 tablet 1   escitalopram (LEXAPRO) 10 MG tablet Take 1 tablet (10 mg total) by mouth daily. 90 tablet 0   lidocaine-prilocaine (EMLA) cream Apply to affected area once 30 g 3   ondansetron (ZOFRAN) 8 MG tablet Take 1 tablet (8 mg total) by mouth every 8 (eight) hours as needed for nausea or vomiting. Start on the third day after chemotherapy. 30 tablet 1   prochlorperazine (COMPAZINE) 10 MG tablet Take 1 tablet (10 mg total) by mouth every 6 (six) hours as needed for nausea or vomiting. 30 tablet 1   VITAMIN D PO Take by mouth.     ZINC CITRATE PO Take by mouth.     No current facility-administered medications  for this visit.   Facility-Administered Medications Ordered in Other Visits  Medication Dose Route Frequency Provider Last Rate Last Admin   0.9 %  sodium chloride infusion   Intravenous Continuous Freddi Schrager, MD 10 mL/hr at 10/07/23 0950 New Bag at 10/07/23 0950   acetaminophen (TYLENOL) tablet 650 mg  650 mg Oral Once Lissie Hinesley, Burnice Logan, MD       CARBOplatin (PARAPLATIN) 440 mg in sodium chloride 0.9 % 250 mL chemo infusion  440 mg Intravenous Once Chelli Yerkes, MD       dexamethasone (DECADRON) injection 10 mg  10 mg Intravenous Once Simrah Chatham, MD       diphenhydrAMINE (BENADRYL) capsule 50 mg  50 mg Oral Once  Rachel Moulds, MD       DOCEtaxel (TAXOTERE) 119 mg in sodium chloride 0.9 % 250 mL chemo infusion  60 mg/m2 (Order-Specific) Intravenous Once Iban Utz, Burnice Logan, MD       fosaprepitant (EMEND) 150 mg in sodium chloride 0.9 % 145 mL IVPB  150 mg Intravenous Once Cohl Behrens, MD       heparin lock flush 100 unit/mL  500 Units Intracatheter Once PRN Doyce Stonehouse, Burnice Logan, MD       palonosetron (ALOXI) injection 0.25 mg  0.25 mg Intravenous Once Anniemae Haberkorn, Burnice Logan, MD       pertuzumab (PERJETA) 420 mg in sodium chloride 0.9 % 250 mL chemo infusion  420 mg Intravenous Once Takuma Cifelli, MD       sodium chloride flush (NS) 0.9 % injection 10 mL  10 mL Intracatheter PRN Lauren Modisette, MD       trastuzumab-anns (KANJINTI) 525 mg in sodium chloride 0.9 % 250 mL chemo infusion  6 mg/kg (Order-Specific) Intravenous Once Lenah Messenger, Burnice Logan, MD        REVIEW OF SYSTEMS:   Constitutional: Denies fevers, chills or abnormal night sweats Eyes: Denies blurriness of vision, double vision or watery eyes Ears, nose, mouth, throat, and face: Denies mucositis or sore throat Respiratory: Denies cough, dyspnea or wheezes Cardiovascular: Denies palpitation, chest discomfort or lower extremity swelling Gastrointestinal:  Denies nausea, heartburn or change in bowel habits Skin: Denies abnormal skin  rashes Lymphatics: Denies new lymphadenopathy or easy bruising Neurological:Denies numbness, tingling or new weaknesses Behavioral/Psych: Mood is stable, no new changes  Breast: Denies any palpable lumps or discharge All other systems were reviewed with the patient and are negative.  PHYSICAL EXAMINATION: ECOG PERFORMANCE STATUS: 0 - Asymptomatic  Vitals:   10/07/23 0844  BP: (!) 104/56  Pulse: 67  Resp: 16  Temp: 98 F (36.7 C)  SpO2: 99%     Filed Weights   10/07/23 0844  Weight: 192 lb 9.6 oz (87.4 kg)      GENERAL:alert, no distress and comfortable SKIN: skin color, texture, turgor are normal, no rashes or significant lesions Chest: CTA bilaterally Heart: RRR Abdomen: soft, non tender, non distended. No LE edema Port site appears well.  LABORATORY DATA:  I have reviewed the data as listed Lab Results  Component Value Date   WBC 10.3 10/07/2023   HGB 12.0 10/07/2023   HCT 36.5 10/07/2023   MCV 85.3 10/07/2023   PLT 258 10/07/2023   Lab Results  Component Value Date   NA 142 10/07/2023   K 3.4 (L) 10/07/2023   CL 109 10/07/2023   CO2 27 10/07/2023    RADIOGRAPHIC STUDIES: I have personally reviewed the radiological reports and agreed with the findings in the report.  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 On Neoadj TCHP. Baseline ECHO with EF of 65-70%.  Chemotherapy-induced Nausea and Vomiting Severe nausea and vomiting for 10 days post-infusion, despite taking Zofran and Compazine. Patient reported difficulty staying ahead of the nausea. -Add Zyprexa 2.5mg  nightly for anticipatory nausea and vomiting. -Continue alternating Zofran and Compazine during the day. -Encourage patient to stay ahead of the  nausea by taking medications regularly.  Chemotherapy-induced Diarrhea Severe diarrhea post-infusion, improved with Imodium and hydration. -Continue Imodium as needed. -Schedule IV fluids at one week post  each chemotherapy infusion to assist with hydration.  Chemotherapy-induced Anorexia and Weight Loss Significant weight loss and decreased appetite post-infusion, improved with IV fluids. -Schedule IV fluids at one week post each chemotherapy infusion to assist with hydration and weight maintenance.  Chemotherapy-induced Alopecia Patient reported significant hair loss. -Encourage patient to explore options for head coverings or wigs if desired.  Follow-up Blood counts recovered well post-treatment. Other blood work pending. -Continue to monitor blood counts and other labs as scheduled. -See patient as scheduled for next chemotherapy infusion.   Time spent: . All questions were answered. The patient knows to call the clinic with any problems, questions or concerns.    Rachel Moulds, MD 10/07/23

## 2023-10-07 ENCOUNTER — Other Ambulatory Visit: Payer: Self-pay | Admitting: *Deleted

## 2023-10-07 ENCOUNTER — Inpatient Hospital Stay: Payer: 59

## 2023-10-07 ENCOUNTER — Inpatient Hospital Stay: Payer: 59 | Admitting: Hematology and Oncology

## 2023-10-07 ENCOUNTER — Telehealth: Payer: Self-pay | Admitting: *Deleted

## 2023-10-07 VITALS — BP 104/56 | HR 67 | Temp 98.0°F | Resp 16 | Wt 192.6 lb

## 2023-10-07 DIAGNOSIS — Z170 Estrogen receptor positive status [ER+]: Secondary | ICD-10-CM

## 2023-10-07 DIAGNOSIS — C50412 Malignant neoplasm of upper-outer quadrant of left female breast: Secondary | ICD-10-CM

## 2023-10-07 DIAGNOSIS — E860 Dehydration: Secondary | ICD-10-CM

## 2023-10-07 LAB — CBC WITH DIFFERENTIAL (CANCER CENTER ONLY)
Abs Immature Granulocytes: 0.05 10*3/uL (ref 0.00–0.07)
Basophils Absolute: 0 10*3/uL (ref 0.0–0.1)
Basophils Relative: 0 %
Eosinophils Absolute: 0.1 10*3/uL (ref 0.0–0.5)
Eosinophils Relative: 1 %
HCT: 36.5 % (ref 36.0–46.0)
Hemoglobin: 12 g/dL (ref 12.0–15.0)
Immature Granulocytes: 1 %
Lymphocytes Relative: 14 %
Lymphs Abs: 1.5 10*3/uL (ref 0.7–4.0)
MCH: 28 pg (ref 26.0–34.0)
MCHC: 32.9 g/dL (ref 30.0–36.0)
MCV: 85.3 fL (ref 80.0–100.0)
Monocytes Absolute: 0.4 10*3/uL (ref 0.1–1.0)
Monocytes Relative: 4 %
Neutro Abs: 8.3 10*3/uL — ABNORMAL HIGH (ref 1.7–7.7)
Neutrophils Relative %: 80 %
Platelet Count: 258 10*3/uL (ref 150–400)
RBC: 4.28 MIL/uL (ref 3.87–5.11)
RDW: 14.3 % (ref 11.5–15.5)
WBC Count: 10.3 10*3/uL (ref 4.0–10.5)
nRBC: 0 % (ref 0.0–0.2)

## 2023-10-07 LAB — CMP (CANCER CENTER ONLY)
ALT: 24 U/L (ref 0–44)
AST: 17 U/L (ref 15–41)
Albumin: 4 g/dL (ref 3.5–5.0)
Alkaline Phosphatase: 73 U/L (ref 38–126)
Anion gap: 6 (ref 5–15)
BUN: 13 mg/dL (ref 6–20)
CO2: 27 mmol/L (ref 22–32)
Calcium: 9.5 mg/dL (ref 8.9–10.3)
Chloride: 109 mmol/L (ref 98–111)
Creatinine: 0.56 mg/dL (ref 0.44–1.00)
GFR, Estimated: >60 mL/min (ref >60)
Glucose, Bld: 136 mg/dL — ABNORMAL HIGH (ref 70–99)
Potassium: 3.4 mmol/L — ABNORMAL LOW (ref 3.5–5.1)
Sodium: 142 mmol/L (ref 135–145)
Total Bilirubin: 0.4 mg/dL (ref 0.0–1.2)
Total Protein: 6.7 g/dL (ref 6.5–8.1)

## 2023-10-07 MED ORDER — HEPARIN SOD (PORK) LOCK FLUSH 100 UNIT/ML IV SOLN
500.0000 [IU] | Freq: Once | INTRAVENOUS | Status: AC | PRN
  Administered 2023-10-07: 500 [IU]

## 2023-10-07 MED ORDER — SODIUM CHLORIDE 0.9 % IV SOLN
INTRAVENOUS | Status: DC
Start: 2023-10-07 — End: 2023-10-07

## 2023-10-07 MED ORDER — SODIUM CHLORIDE 0.9 % IV SOLN
60.0000 mg/m2 | Freq: Once | INTRAVENOUS | Status: AC
  Administered 2023-10-07: 119 mg via INTRAVENOUS
  Filled 2023-10-07: qty 11.9

## 2023-10-07 MED ORDER — SODIUM CHLORIDE 0.9% FLUSH
10.0000 mL | Freq: Once | INTRAVENOUS | Status: AC | PRN
Start: 2023-10-07 — End: 2023-10-07
  Administered 2023-10-07: 10 mL

## 2023-10-07 MED ORDER — SODIUM CHLORIDE 0.9 % IV SOLN
150.0000 mg | Freq: Once | INTRAVENOUS | Status: AC
  Administered 2023-10-07: 150 mg via INTRAVENOUS
  Filled 2023-10-07: qty 150

## 2023-10-07 MED ORDER — DIPHENHYDRAMINE HCL 25 MG PO CAPS
50.0000 mg | ORAL_CAPSULE | Freq: Once | ORAL | Status: AC
  Administered 2023-10-07: 50 mg via ORAL
  Filled 2023-10-07: qty 2

## 2023-10-07 MED ORDER — DEXAMETHASONE SODIUM PHOSPHATE 10 MG/ML IJ SOLN
10.0000 mg | Freq: Once | INTRAMUSCULAR | Status: AC
  Administered 2023-10-07: 10 mg via INTRAVENOUS
  Filled 2023-10-07: qty 1

## 2023-10-07 MED ORDER — TRASTUZUMAB-ANNS CHEMO 150 MG IV SOLR
6.0000 mg/kg | Freq: Once | INTRAVENOUS | Status: AC
  Administered 2023-10-07: 525 mg via INTRAVENOUS
  Filled 2023-10-07: qty 25

## 2023-10-07 MED ORDER — PALONOSETRON HCL INJECTION 0.25 MG/5ML
0.2500 mg | Freq: Once | INTRAVENOUS | Status: AC
Start: 2023-10-07 — End: 2023-10-07
  Administered 2023-10-07: 0.25 mg via INTRAVENOUS
  Filled 2023-10-07: qty 5

## 2023-10-07 MED ORDER — SODIUM CHLORIDE 0.9 % IV SOLN
420.0000 mg | Freq: Once | INTRAVENOUS | Status: AC
  Administered 2023-10-07: 420 mg via INTRAVENOUS
  Filled 2023-10-07: qty 14

## 2023-10-07 MED ORDER — SODIUM CHLORIDE 0.9% FLUSH
10.0000 mL | INTRAVENOUS | Status: DC | PRN
Start: 2023-10-07 — End: 2023-10-07
  Administered 2023-10-07: 10 mL

## 2023-10-07 MED ORDER — ACETAMINOPHEN 325 MG PO TABS
650.0000 mg | ORAL_TABLET | Freq: Once | ORAL | Status: AC
  Administered 2023-10-07: 650 mg via ORAL
  Filled 2023-10-07: qty 2

## 2023-10-07 MED ORDER — OLANZAPINE 2.5 MG PO TABS: 2.5000 mg | ORAL_TABLET | Freq: Every day | ORAL | 1 refills | Status: DC

## 2023-10-07 MED ORDER — SODIUM CHLORIDE 0.9 % IV SOLN
442.8000 mg | Freq: Once | INTRAVENOUS | Status: AC
  Administered 2023-10-07: 440 mg via INTRAVENOUS
  Filled 2023-10-07: qty 44

## 2023-10-07 NOTE — Patient Instructions (Signed)
 [facility] - A DEPT OF [facility]  Discharge Instructions: Thank you for choosing [facility] to provide your oncology and hematology care.   If you have a lab appointment with the Cancer Center, please go directly to the Cancer Center and check in at the registration area.   Wear comfortable clothing and clothing appropriate for easy access to any Portacath or PICC line.   We strive to give you quality time with your provider. You may need to reschedule your appointment if you arrive late (15 or more minutes).  Arriving late affects you and other patients whose appointments are after yours.  Also, if you miss three or more appointments without notifying the office, you may be dismissed from the clinic at the provider's discretion.      For prescription refill requests, have your pharmacy contact our office and allow 72 hours for refills to be completed.    Today you received the following chemotherapy and/or immunotherapy agents Trastuzumab (Kanjinti), Pertuzumab (Perjeta), Docetaxel (Taxotere), Carboplatin      To help prevent nausea and vomiting after your treatment, we encourage you to take your nausea medication as directed.  BELOW ARE SYMPTOMS THAT SHOULD BE REPORTED IMMEDIATELY: *FEVER GREATER THAN 100.4 F (38 C) OR HIGHER *CHILLS OR SWEATING *NAUSEA AND VOMITING THAT IS NOT CONTROLLED WITH YOUR NAUSEA MEDICATION *UNUSUAL SHORTNESS OF BREATH *UNUSUAL BRUISING OR BLEEDING *URINARY PROBLEMS (pain or burning when urinating, or frequent urination) *BOWEL PROBLEMS (unusual diarrhea, constipation, pain near the anus) TENDERNESS IN MOUTH AND THROAT WITH OR WITHOUT PRESENCE OF ULCERS (sore throat, sores in mouth, or a toothache) UNUSUAL RASH, SWELLING OR PAIN  UNUSUAL VAGINAL DISCHARGE OR ITCHING   Items with * indicate a potential emergency and should be followed up as soon as possible or go to the Emergency Department if any problems  should occur.  Please show the CHEMOTHERAPY ALERT CARD or IMMUNOTHERAPY ALERT CARD at check-in to the Emergency Department and triage nurse.  Should you have questions after your visit or need to cancel or reschedule your appointment, please contact [facility] - A DEPT OF [facility]  Dept: [phone]  and follow the prompts.  Office hours are 8:00 a.m. to 4:30 p.m. Monday - Friday. Please note that voicemails left after 4:00 p.m. may not be returned until the following business day.  We are closed weekends and major holidays. You have access to a nurse at all times for urgent questions. Please call the main number to the clinic Dept: [phone] and follow the prompts.   For any non-urgent questions, you may also contact your provider using MyChart. We now offer e-Visits for anyone 61 and older to request care online for non-urgent symptoms. For details visit [email or web address].   Also download the MyChart app! Go to the app store, search "MyChart", open the app, select , and log in with your MyChart username and password.

## 2023-10-07 NOTE — Telephone Encounter (Signed)
 Scheduling message sent to scheduler to arrange IV fluids for pt one week after chemo treatment. Orders have been placed under supportive therapy

## 2023-10-08 ENCOUNTER — Other Ambulatory Visit: Payer: Self-pay

## 2023-10-10 ENCOUNTER — Inpatient Hospital Stay: Payer: 59 | Attending: Hematology and Oncology

## 2023-10-10 VITALS — BP 96/53 | HR 65 | Temp 98.8°F | Resp 15

## 2023-10-10 DIAGNOSIS — K219 Gastro-esophageal reflux disease without esophagitis: Secondary | ICD-10-CM | POA: Diagnosis not present

## 2023-10-10 DIAGNOSIS — Z5189 Encounter for other specified aftercare: Secondary | ICD-10-CM | POA: Insufficient documentation

## 2023-10-10 DIAGNOSIS — Z7952 Long term (current) use of systemic steroids: Secondary | ICD-10-CM | POA: Diagnosis not present

## 2023-10-10 DIAGNOSIS — E7800 Pure hypercholesterolemia, unspecified: Secondary | ICD-10-CM | POA: Diagnosis not present

## 2023-10-10 DIAGNOSIS — C50412 Malignant neoplasm of upper-outer quadrant of left female breast: Secondary | ICD-10-CM | POA: Insufficient documentation

## 2023-10-10 DIAGNOSIS — K521 Toxic gastroenteritis and colitis: Secondary | ICD-10-CM | POA: Diagnosis not present

## 2023-10-10 DIAGNOSIS — Z5111 Encounter for antineoplastic chemotherapy: Secondary | ICD-10-CM | POA: Insufficient documentation

## 2023-10-10 DIAGNOSIS — T451X5A Adverse effect of antineoplastic and immunosuppressive drugs, initial encounter: Secondary | ICD-10-CM | POA: Insufficient documentation

## 2023-10-10 DIAGNOSIS — I4891 Unspecified atrial fibrillation: Secondary | ICD-10-CM | POA: Diagnosis not present

## 2023-10-10 DIAGNOSIS — Z79899 Other long term (current) drug therapy: Secondary | ICD-10-CM | POA: Diagnosis not present

## 2023-10-10 DIAGNOSIS — E860 Dehydration: Secondary | ICD-10-CM

## 2023-10-10 MED ORDER — PEGFILGRASTIM-CBQV 6 MG/0.6ML ~~LOC~~ SOSY
6.0000 mg | PREFILLED_SYRINGE | Freq: Once | SUBCUTANEOUS | Status: AC
  Administered 2023-10-10: 6 mg via SUBCUTANEOUS
  Filled 2023-10-10: qty 0.6

## 2023-10-14 ENCOUNTER — Inpatient Hospital Stay: Payer: 59

## 2023-10-14 VITALS — BP 115/72 | HR 80 | Temp 98.7°F | Resp 18

## 2023-10-14 DIAGNOSIS — E860 Dehydration: Secondary | ICD-10-CM

## 2023-10-14 DIAGNOSIS — C50412 Malignant neoplasm of upper-outer quadrant of left female breast: Secondary | ICD-10-CM | POA: Diagnosis not present

## 2023-10-14 MED ORDER — SODIUM CHLORIDE 0.9 % IV SOLN
Freq: Once | INTRAVENOUS | Status: AC
  Filled 2023-10-14: qty 4

## 2023-10-14 MED ORDER — SODIUM CHLORIDE 0.9 % IV SOLN: Freq: Once | INTRAVENOUS | Status: AC

## 2023-10-14 MED ORDER — SODIUM CHLORIDE 0.9% FLUSH
10.0000 mL | Freq: Once | INTRAVENOUS | Status: AC | PRN
  Administered 2023-10-14: 10 mL

## 2023-10-14 MED ORDER — HEPARIN SOD (PORK) LOCK FLUSH 100 UNIT/ML IV SOLN
500.0000 [IU] | Freq: Once | INTRAVENOUS | Status: AC | PRN
Start: 2023-10-14 — End: 2023-10-14
  Administered 2023-10-14: 500 [IU]

## 2023-10-20 ENCOUNTER — Ambulatory Visit: Payer: 59 | Admitting: Hematology and Oncology

## 2023-10-20 ENCOUNTER — Ambulatory Visit: Payer: 59

## 2023-10-20 ENCOUNTER — Other Ambulatory Visit: Payer: 59

## 2023-10-22 ENCOUNTER — Ambulatory Visit: Payer: 59

## 2023-10-27 MED FILL — Fosaprepitant Dimeglumine For IV Infusion 150 MG (Base Eq): INTRAVENOUS | Qty: 5 | Status: AC

## 2023-10-28 ENCOUNTER — Inpatient Hospital Stay: Payer: 59 | Admitting: Hematology and Oncology

## 2023-10-28 ENCOUNTER — Inpatient Hospital Stay: Payer: 59

## 2023-10-28 VITALS — BP 121/72 | HR 77 | Temp 97.8°F | Resp 16 | Wt 195.4 lb

## 2023-10-28 DIAGNOSIS — E860 Dehydration: Secondary | ICD-10-CM

## 2023-10-28 DIAGNOSIS — C50412 Malignant neoplasm of upper-outer quadrant of left female breast: Secondary | ICD-10-CM

## 2023-10-28 DIAGNOSIS — Z170 Estrogen receptor positive status [ER+]: Secondary | ICD-10-CM

## 2023-10-28 LAB — CMP (CANCER CENTER ONLY)
ALT: 26 U/L (ref 0–44)
AST: 16 U/L (ref 15–41)
Albumin: 4 g/dL (ref 3.5–5.0)
Alkaline Phosphatase: 75 U/L (ref 38–126)
Anion gap: 7 (ref 5–15)
BUN: 14 mg/dL (ref 6–20)
CO2: 28 mmol/L (ref 22–32)
Calcium: 9.2 mg/dL (ref 8.9–10.3)
Chloride: 108 mmol/L (ref 98–111)
Creatinine: 0.54 mg/dL (ref 0.44–1.00)
GFR, Estimated: >60 mL/min (ref >60)
Glucose, Bld: 142 mg/dL — ABNORMAL HIGH (ref 70–99)
Potassium: 3.3 mmol/L — ABNORMAL LOW (ref 3.5–5.1)
Sodium: 143 mmol/L (ref 135–145)
Total Bilirubin: 0.4 mg/dL (ref 0.0–1.2)
Total Protein: 6.5 g/dL (ref 6.5–8.1)

## 2023-10-28 LAB — CBC WITH DIFFERENTIAL (CANCER CENTER ONLY)
Abs Immature Granulocytes: 0.04 10*3/uL (ref 0.00–0.07)
Basophils Absolute: 0 10*3/uL (ref 0.0–0.1)
Basophils Relative: 0 %
Eosinophils Absolute: 0.1 10*3/uL (ref 0.0–0.5)
Eosinophils Relative: 1 %
HCT: 34.5 % — ABNORMAL LOW (ref 36.0–46.0)
Hemoglobin: 11.5 g/dL — ABNORMAL LOW (ref 12.0–15.0)
Immature Granulocytes: 1 %
Lymphocytes Relative: 14 %
Lymphs Abs: 1.2 10*3/uL (ref 0.7–4.0)
MCH: 28.5 pg (ref 26.0–34.0)
MCHC: 33.3 g/dL (ref 30.0–36.0)
MCV: 85.4 fL (ref 80.0–100.0)
Monocytes Absolute: 0.2 10*3/uL (ref 0.1–1.0)
Monocytes Relative: 3 %
Neutro Abs: 6.8 10*3/uL (ref 1.7–7.7)
Neutrophils Relative %: 81 %
Platelet Count: 262 10*3/uL (ref 150–400)
RBC: 4.04 MIL/uL (ref 3.87–5.11)
RDW: 15.4 % (ref 11.5–15.5)
WBC Count: 8.3 10*3/uL (ref 4.0–10.5)
nRBC: 0 % (ref 0.0–0.2)

## 2023-10-28 MED ORDER — SODIUM CHLORIDE 0.9 % IV SOLN: INTRAVENOUS | Status: DC

## 2023-10-28 MED ORDER — SODIUM CHLORIDE 0.9 % IV SOLN
442.8000 mg | Freq: Once | INTRAVENOUS | Status: AC
  Administered 2023-10-28: 440 mg via INTRAVENOUS
  Filled 2023-10-28: qty 44

## 2023-10-28 MED ORDER — PALONOSETRON HCL INJECTION 0.25 MG/5ML
0.2500 mg | Freq: Once | INTRAVENOUS | Status: AC
  Administered 2023-10-28: 0.25 mg via INTRAVENOUS
  Filled 2023-10-28: qty 5

## 2023-10-28 MED ORDER — TRASTUZUMAB-ANNS CHEMO 150 MG IV SOLR
6.0000 mg/kg | Freq: Once | INTRAVENOUS | Status: AC
  Administered 2023-10-28: 525 mg via INTRAVENOUS
  Filled 2023-10-28: qty 25

## 2023-10-28 MED ORDER — ACETAMINOPHEN 325 MG PO TABS
650.0000 mg | ORAL_TABLET | Freq: Once | ORAL | Status: AC
  Administered 2023-10-28: 650 mg via ORAL
  Filled 2023-10-28: qty 2

## 2023-10-28 MED ORDER — SODIUM CHLORIDE 0.9% FLUSH
10.0000 mL | Freq: Once | INTRAVENOUS | Status: AC | PRN
  Administered 2023-10-28: 10 mL

## 2023-10-28 MED ORDER — HEPARIN SOD (PORK) LOCK FLUSH 100 UNIT/ML IV SOLN
500.0000 [IU] | Freq: Once | INTRAVENOUS | Status: AC | PRN
  Administered 2023-10-28: 500 [IU]

## 2023-10-28 MED ORDER — SODIUM CHLORIDE 0.9% FLUSH
10.0000 mL | INTRAVENOUS | Status: DC | PRN
Start: 2023-10-28 — End: 2023-10-28
  Administered 2023-10-28: 10 mL

## 2023-10-28 MED ORDER — SODIUM CHLORIDE 0.9 % IV SOLN
60.0000 mg/m2 | Freq: Once | INTRAVENOUS | Status: AC
  Administered 2023-10-28: 119 mg via INTRAVENOUS
  Filled 2023-10-28: qty 11.9

## 2023-10-28 MED ORDER — DEXAMETHASONE SODIUM PHOSPHATE 10 MG/ML IJ SOLN
10.0000 mg | Freq: Once | INTRAMUSCULAR | Status: AC
  Administered 2023-10-28: 10 mg via INTRAVENOUS
  Filled 2023-10-28: qty 1

## 2023-10-28 MED ORDER — PERTUZUMAB CHEMO INJECTION 420 MG/14ML
420.0000 mg | Freq: Once | INTRAVENOUS | Status: AC
  Administered 2023-10-28: 420 mg via INTRAVENOUS
  Filled 2023-10-28: qty 14

## 2023-10-28 MED ORDER — DIPHENHYDRAMINE HCL 25 MG PO CAPS
50.0000 mg | ORAL_CAPSULE | Freq: Once | ORAL | Status: AC
  Administered 2023-10-28: 50 mg via ORAL
  Filled 2023-10-28: qty 2

## 2023-10-28 MED ORDER — SODIUM CHLORIDE 0.9 % IV SOLN
150.0000 mg | Freq: Once | INTRAVENOUS | Status: AC
  Administered 2023-10-28: 150 mg via INTRAVENOUS
  Filled 2023-10-28: qty 150

## 2023-10-28 NOTE — Progress Notes (Signed)
 [facility] CONSULT NOTE  Patient Care Team: Lind Covert, MD as PCP - General (Family Medicine) Thomasene Ripple, DO as PCP - Cardiology (Cardiology) Pershing Proud, RN as Oncology Nurse Navigator Donnelly Angelica, RN as Oncology Nurse Navigator Rachel Moulds, MD as Consulting Physician (Hematology and Oncology) Manus Rudd, MD as Consulting Physician (General Surgery) Lonie Peak, MD as Attending Physician (Radiation Oncology)  CHIEF COMPLAINTS/PURPOSE OF CONSULTATION:  Newly diagnosed breast cancer  HISTORY OF PRESENTING ILLNESS:  Martha Washington 58 y.o. female is here because of recent diagnosis of left breast cancer  I reviewed her records extensively and collaborated the history with the patient.  SUMMARY OF ONCOLOGIC HISTORY: Oncology History  Malignant neoplasm of upper-outer quadrant of left breast in female, estrogen receptor positive (HCC)  08/04/2023 Mammogram   Further evaluation is suggested for calcifications in the left breast.  There is a group of coarse heterogeneous calcifications associated with an asymmetry in the UPPER OUTER QUADRANT at posterior depth spanning approximately 3.2 x 2.2 x 2.9 cm (AP x transverse x CC). There are linear and branching forms within the large group. There is a possible mass at the posterior margin of the calcifications measuring just under 1 cm in size.   Targeted ultrasound is performed, demonstrating an irregular hypoechoic mass with angular margins and associated calcifications at 2 o'clock 9 cm from the nipple measuring approximately 2.1 x 1.6 x 1.6 cm, demonstrating posterior acoustic shadowing and demonstrating internal power Doppler flow. There is a 0.4 x 0.3 x 0.2 cm satellite mass at 2 o'clock 7 cm from the nipple which is approximately 2 cm removed from the dominant mass.     08/16/2023 Pathology Results     08/22/2023 Initial Diagnosis   Malignant neoplasm of upper-outer quadrant of left breast  in female, estrogen receptor positive (HCC)   08/24/2023 Cancer Staging   Staging form: Breast, AJCC 8th Edition - Clinical stage from 08/24/2023: Stage IB (cT2, cN0, cM0, G2, ER+, PR+, HER2+) - Signed by Rachel Moulds, MD on 08/24/2023 Stage prefix: Initial diagnosis Histologic grading system: 3 grade system Laterality: Left Staged by: Pathologist and managing physician Stage used in treatment planning: Yes National guidelines used in treatment planning: Yes Type of national guideline used in treatment planning: NCCN   08/31/2023 Genetic Testing   Negative [facility] CancerNext+RNAinsight Panel.  Report date is 08/31/2023.   The [facility] CancerNext+RNAinsight Panel includes sequencing, rearrangement analysis, and RNA analysis for the following 39 genes: APC, ATM, BAP1, BARD1, BMPR1A, BRCA1, BRCA2, BRIP1, CDH1, CDKN2A, CHEK2, FH, FLCN, MET, MLH1, MSH2, MSH6, MUTYH, NF1, NTHL1, PALB2, PMS2, PTEN, RAD51C, RAD51D, SMAD4, STK11, TP53, TSC1, TSC2, and VHL (sequencing and deletion/duplication); AXIN2, HOXB13, MBD4, MSH3, POLD1 and POLE (sequencing only); EPCAM and GREM1 (deletion/duplication only).    09/16/2023 -  Chemotherapy   Patient is on Treatment Plan : BREAST  Docetaxel + Carboplatin + Trastuzumab + Pertuzumab  (TCHP) q21d       Discussed the use of AI scribe software for clinical note transcription with the patient, who gave verbal consent to proceed.  History of Present Illness    [name] is a 58 year old female undergoing chemotherapy who presents for follow-up regarding treatment side effects.  She experiences diarrhea approximately four times a day, which can be urgent, requiring her to 'take off running.' Initially, she tried Zyprexa to manage nausea but discontinued it due to excessive sleepiness, which interfered with her ability to manage the diarrhea at night. She switched  to Compazine, which she finds more manageable, and uses Imodium as needed, typically taking two after the  first episode and then one in the morning and one at night for the last ten days. The diarrhea has been persistent throughout her treatment, but she is able to maintain hydration. She notes that the stool consistency changes to a clay-like color, which she attributes to the Imodium after researching online.  Nausea is well controlled with Compazine. No fevers or chills, but she experiences night sweats localized to her upper body, describing waking up feeling as though she has wet the bed due to the sweating.  Regarding her breast lump, she mentions an incident where her grandson, who is one year old, accidentally pushed on her breast, causing pain for two days. She engages in daily 'Nani naps' with her grandson, indicating a close relationship and regular physical contact.  Rest of the pertinent 10 point ROS reviewed and neg.  MEDICAL HISTORY:  Past Medical History:  Diagnosis Date   Atrial fibrillation with RVR (HCC)    GERD (gastroesophageal reflux disease)    no meds   High cholesterol    Menorrhagia 07/13/2013   Missed abortion    resolved - no surgery required   New onset atrial fibrillation (HCC) 08/04/2017   with RVR to the 160s; S/P hammertoe OR/notes 08/04/2017   S/P bunionectomy 08/04/2017   SVD (spontaneous vaginal delivery) X 2   Tachycardia 08/04/2017    SURGICAL HISTORY: Past Surgical History:  Procedure Laterality Date   ABDOMINAL HYSTERECTOMY     BACK SURGERY     BREAST BIOPSY Left 08/16/2023   [place] LT BREAST BX W LOC DEV 1ST LESION IMG BX SPEC US GUIDE 08/16/2023 [facility] MAMMOGRAP[facility]   BREAST BIOPSY Left 09/12/2023   [place] LT BREAST BX W LOC DEV 1ST LESION IMG BX SPEC US GUIDE 09/12/2023 [facility] MAMMOGRAP[facility]   BUNIONECTOMY WITH HAMMERTOE RECONSTRUCTION Right 03/2017   BUNIONECTOMY WITH HAMMERTOE RECONSTRUCTION Left 08/04/2017   BUNIONECTOMY WITH HAMMERTOE RECONSTRUCTION Left 08/04/2017   Procedure: Left modified McBride and Lapidus bunion corrections; Left 2-3 Weil and hammertoe  corrections;  Surgeon: Toni Arthurs, MD;  Location: [facility];  Service: Orthopedics;  Laterality: Left;   PORTACATH PLACEMENT Right 09/15/2023   Procedure: INSERTION PORT-A-CATH WITH ULTASOUND GUIDANCE;  Surgeon: Manus Rudd, MD;  Location: [facility];  Service: General;  Laterality: Right;  Leave port accessed   ROBOTIC ASSISTED TOTAL HYSTERECTOMY WITH BILATERAL SALPINGO OOPHERECTOMY N/A 07/13/2013   Procedure: ROBOTIC ASSISTED TOTAL HYSTERECTOMY WITH BILATERAL SALPINGECTOMY;  Surgeon: Esmeralda Arthur, MD;  Location: [facility];  Service: Gynecology;  Laterality: N/A;   SHOULDER ARTHROSCOPY W/ ROTATOR CUFF REPAIR Right 2010   SHOULDER ARTHROSCOPY WITH ROTATOR CUFF REPAIR Right 10/17/2014   Procedure: SHOULDER ARTHROSCOPY WITH ROTATOR CUFF REPAIR;  Surgeon: Mckinley Jewel, MD;  Location: [facility];  Service: Orthopedics;  Laterality: Right;   TUBAL LIGATION     WEIL OSTEOTOMY Left 08/04/2017   Procedure: Left 2nd and 3rd weil osteotomy;  Surgeon: Toni Arthurs, MD;  Location: [facility];  Service: Orthopedics;  Laterality: Left;   WISDOM TOOTH EXTRACTION      SOCIAL HISTORY: Social History   Socioeconomic History   Marital status: Married    Spouse name: Loraine Leriche   Number of children: Not on file   Years of education: Not on file   Highest education level: Not on file  Occupational History   Not on file  Tobacco Use  Smoking status: Never   Smokeless tobacco: Never  Vaping Use   Vaping status: Never Used  Substance and Sexual Activity   Alcohol use: No   Drug use: No   Sexual activity: Not on file  Other Topics Concern   Not on file  Social History Narrative   Lives with husband   Right Handed   Drinks 1 cup caffeine daily   Social Drivers of Corporate investment banker Strain: Not on file  Food Insecurity: Not on file  Transportation Needs: Not on file  Physical Activity: Not on file  Stress: Not on file  Social  Connections: Unknown (12/22/2021)   Received from [facility], [facility]   Social Network    Social Network: Not on file  Intimate Partner Violence: Unknown (11/13/2021)   Received from [facility], [facility]   HITS    Physically Hurt: Not on file    Insult or Talk Down To: Not on file    Threaten Physical Harm: Not on file    Scream or Curse: Not on file    FAMILY HISTORY: Family History  Problem Relation Age of Onset   Hypertension Mother    Hypertension Father    Bone cancer Brother 41       or other primary?    ALLERGIES:  is allergic to azithromycin, morphine and codeine, and oxycodone.  MEDICATIONS:  Current Outpatient Medications  Medication Sig Dispense Refill   Ascorbic Acid (VITAMIN C) 1000 MG tablet Take 1,000 mg by mouth daily.     b complex vitamins capsule Take 1 capsule by mouth daily.     dexamethasone (DECADRON) 4 MG tablet Take 2 tabs by mouth 2 times daily starting day before chemo. Then take 2 tabs daily for 2 days starting day after chemo. Take with food. 30 tablet 1   escitalopram (LEXAPRO) 10 MG tablet Take 1 tablet (10 mg total) by mouth daily. 90 tablet 0   lidocaine-prilocaine (EMLA) cream Apply to affected area once 30 g 3   OLANZapine (ZYPREXA) 2.5 MG tablet Take 1 tablet (2.5 mg total) by mouth at bedtime. 30 tablet 1   ondansetron (ZOFRAN) 8 MG tablet Take 1 tablet (8 mg total) by mouth every 8 (eight) hours as needed for nausea or vomiting. Start on the third day after chemotherapy. 30 tablet 1   prochlorperazine (COMPAZINE) 10 MG tablet Take 1 tablet (10 mg total) by mouth every 6 (six) hours as needed for nausea or vomiting. 30 tablet 1   VITAMIN D PO Take by mouth.     ZINC CITRATE PO Take by mouth.     No current facility-administered medications for this visit.   Facility-Administered Medications Ordered in Other Visits  Medication Dose Route Frequency Provider Last Rate Last Admin   0.9 %  sodium chloride infusion   Intravenous  Continuous Gabriell Casimir, Burnice Logan, MD 10 mL/hr at 10/28/23 0939 New Bag at 10/28/23 0939   CARBOplatin (PARAPLATIN) 440 mg in sodium chloride 0.9 % 250 mL chemo infusion  440 mg Intravenous Once Taneya Conkel, Burnice Logan, MD       DOCEtaxel (TAXOTERE) 119 mg in sodium chloride 0.9 % 250 mL chemo infusion  60 mg/m2 (Order-Specific) Intravenous Once Modelle Vollmer, Burnice Logan, MD       heparin lock flush 100 unit/mL  500 Units Intracatheter Once PRN Terri Rorrer, Burnice Logan, MD       pertuzumab (PERJETA) 420 mg in sodium chloride 0.9 % 250 mL chemo infusion  420 mg Intravenous Once Lupe Bonner,  Annelies Coyt, MD 528 mL/hr at 10/28/23 1046 420 mg at 10/28/23 1046   sodium chloride flush (NS) 0.9 % injection 10 mL  10 mL Intracatheter PRN Rachel Moulds, MD        REVIEW OF SYSTEMS:   Constitutional: Denies fevers, chills or abnormal night sweats Eyes: Denies blurriness of vision, double vision or watery eyes Ears, nose, mouth, throat, and face: Denies mucositis or sore throat Respiratory: Denies cough, dyspnea or wheezes Cardiovascular: Denies palpitation, chest discomfort or lower extremity swelling Gastrointestinal:  Denies nausea, heartburn or change in bowel habits Skin: Denies abnormal skin rashes Lymphatics: Denies new lymphadenopathy or easy bruising Neurological:Denies numbness, tingling or new weaknesses Behavioral/Psych: Mood is stable, no new changes  Breast: Denies any palpable lumps or discharge All other systems were reviewed with the patient and are negative.  PHYSICAL EXAMINATION: ECOG PERFORMANCE STATUS: 0 - Asymptomatic  Vitals:   10/28/23 0851  BP: 121/72  Pulse: 77  Resp: 16  Temp: 97.8 F (36.6 C)  SpO2: 95%      Filed Weights   10/28/23 0851  Weight: 195 lb 6.4 oz (88.6 kg)       GENERAL:alert, no distress and comfortable SKIN: skin color, texture, turgor are normal, no rashes or significant lesions Chest: CTA bilaterally Breast mass: Left breast mass smaller to palpation. Heart: RRR Abdomen:  soft, non tender, non distended. No LE edema Port site appears well.  LABORATORY DATA:  I have reviewed the data as listed Lab Results  Component Value Date   WBC 8.3 10/28/2023   HGB 11.5 (L) 10/28/2023   HCT 34.5 (L) 10/28/2023   MCV 85.4 10/28/2023   PLT 262 10/28/2023   Lab Results  Component Value Date   NA 143 10/28/2023   K 3.3 (L) 10/28/2023   CL 108 10/28/2023   CO2 28 10/28/2023    RADIOGRAPHIC STUDIES: I have personally reviewed the radiological reports and agreed with the findings in the report.  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 On Neoadj TCHP. Baseline ECHO with EF of 65-70%.  Breast cancer Undergoing chemotherapy with good tolerance. Breast lump reduced in size, tender on pressure.  Treatment halfway complete, no dose reductions needed. - Continue current chemotherapy regimen.  Chemotherapy-induced diarrhea Grade 1 diarrhea managed with Imodium, occurring four times daily. Maintained hydration. - Continue Imodium as needed. - Ensure adequate hydration.  Chemotherapy-induced nausea Nausea controlled with Compazine. Zyprexa discontinued due to sedation. - Continue Compazine.     Time spent: . All questions were answered. The patient knows to call the clinic with any problems, questions or concerns.    Rachel Moulds, MD 10/28/23

## 2023-10-28 NOTE — Patient Instructions (Addendum)
 [facility] - A DEPT OF [facility]  Discharge Instructions: Thank you for choosing [facility] to provide your oncology and hematology care.   If you have a lab appointment with the Cancer Center, please go directly to the Cancer Center and check in at the registration area.   Wear comfortable clothing and clothing appropriate for easy access to any Portacath or PICC line.   We strive to give you quality time with your provider. You may need to reschedule your appointment if you arrive late (15 or more minutes).  Arriving late affects you and other patients whose appointments are after yours.  Also, if you miss three or more appointments without notifying the office, you may be dismissed from the clinic at the provider's discretion.      For prescription refill requests, have your pharmacy contact our office and allow 72 hours for refills to be completed.    Today you received the following chemotherapy and/or immunotherapy agents Trastuzumab (Kanjinti), Pertuzumab (Perjeta), Docetaxel (Taxotere), Carboplatin      To help prevent nausea and vomiting after your treatment, we encourage you to take your nausea medication as directed.  BELOW ARE SYMPTOMS THAT SHOULD BE REPORTED IMMEDIATELY: *FEVER GREATER THAN 100.4 F (38 C) OR HIGHER *CHILLS OR SWEATING *NAUSEA AND VOMITING THAT IS NOT CONTROLLED WITH YOUR NAUSEA MEDICATION *UNUSUAL SHORTNESS OF BREATH *UNUSUAL BRUISING OR BLEEDING *URINARY PROBLEMS (pain or burning when urinating, or frequent urination) *BOWEL PROBLEMS (unusual diarrhea, constipation, pain near the anus) TENDERNESS IN MOUTH AND THROAT WITH OR WITHOUT PRESENCE OF ULCERS (sore throat, sores in mouth, or a toothache) UNUSUAL RASH, SWELLING OR PAIN  UNUSUAL VAGINAL DISCHARGE OR ITCHING   Items with * indicate a potential emergency and should be followed up as soon as possible or go to the Emergency Department if any problems  should occur.  Please show the CHEMOTHERAPY ALERT CARD or IMMUNOTHERAPY ALERT CARD at check-in to the Emergency Department and triage nurse.  Should you have questions after your visit or need to cancel or reschedule your appointment, please contact [facility] - A DEPT OF [facility]  Dept: [phone]  and follow the prompts.  Office hours are 8:00 a.m. to 4:30 p.m. Monday - Friday. Please note that voicemails left after 4:00 p.m. may not be returned until the following business day.  We are closed weekends and major holidays. You have access to a nurse at all times for urgent questions. Please call the main number to the clinic Dept: [phone] and follow the prompts.   For any non-urgent questions, you may also contact your provider using MyChart. We now offer e-Visits for anyone 11 and older to request care online for non-urgent symptoms. For details visit [email or web address].   Also download the MyChart app! Go to the app store, search "MyChart", open the app, select [facility], and log in with your MyChart username and password.\[id number]\

## 2023-10-28 NOTE — Assessment & Plan Note (Addendum)
 Invasive Ductal Carcinoma, Left Breast Newly diagnosed, HER2 positive, estrogen receptor positive, with a primary mass measuring 2.1 cm and a satellite nodule of 4 mm. On Neoadj TCHP. Baseline ECHO with EF of 65-70%.  Breast cancer Undergoing chemotherapy with good tolerance. Breast lump reduced in size, tender on pressure.  Treatment halfway complete, no dose reductions needed. - Continue current chemotherapy regimen.  Chemotherapy-induced diarrhea Grade 1 diarrhea managed with Imodium, occurring four times daily. Maintained hydration. - Continue Imodium as needed. - Ensure adequate hydration.  Chemotherapy-induced nausea Nausea controlled with Compazine. Zyprexa discontinued due to sedation. - Continue Compazine.

## 2023-10-31 ENCOUNTER — Inpatient Hospital Stay: Payer: 59

## 2023-10-31 VITALS — BP 106/64 | HR 70 | Temp 98.8°F | Resp 16

## 2023-10-31 DIAGNOSIS — E860 Dehydration: Secondary | ICD-10-CM

## 2023-10-31 DIAGNOSIS — C50412 Malignant neoplasm of upper-outer quadrant of left female breast: Secondary | ICD-10-CM

## 2023-10-31 MED ORDER — PEGFILGRASTIM-CBQV 6 MG/0.6ML ~~LOC~~ SOSY
6.0000 mg | PREFILLED_SYRINGE | Freq: Once | SUBCUTANEOUS | Status: AC
Start: 2023-10-31 — End: 2023-10-31
  Administered 2023-10-31: 6 mg via SUBCUTANEOUS
  Filled 2023-10-31: qty 0.6

## 2023-11-04 ENCOUNTER — Inpatient Hospital Stay: Payer: 59

## 2023-11-04 VITALS — BP 130/85 | HR 99 | Temp 99.4°F | Resp 18 | Ht 64.0 in | Wt 190.0 lb

## 2023-11-04 DIAGNOSIS — E860 Dehydration: Secondary | ICD-10-CM

## 2023-11-04 DIAGNOSIS — C50412 Malignant neoplasm of upper-outer quadrant of left female breast: Secondary | ICD-10-CM | POA: Diagnosis not present

## 2023-11-04 MED ORDER — SODIUM CHLORIDE 0.9% FLUSH
10.0000 mL | Freq: Once | INTRAVENOUS | Status: AC | PRN
  Administered 2023-11-04: 10 mL

## 2023-11-04 MED ORDER — ONDANSETRON HCL 4 MG/2ML IJ SOLN
8.0000 mg | Freq: Once | INTRAMUSCULAR | Status: AC
Start: 2023-11-04 — End: 2023-11-04
  Administered 2023-11-04: 8 mg via INTRAVENOUS
  Filled 2023-11-04: qty 4

## 2023-11-04 MED ORDER — SODIUM CHLORIDE 0.9 % IV SOLN: Freq: Once | INTRAVENOUS | Status: AC

## 2023-11-04 MED ORDER — HEPARIN SOD (PORK) LOCK FLUSH 100 UNIT/ML IV SOLN
500.0000 [IU] | Freq: Once | INTRAVENOUS | Status: AC | PRN
Start: 2023-11-04 — End: 2023-11-04
  Administered 2023-11-04: 500 [IU]

## 2023-11-04 MED ORDER — SODIUM CHLORIDE 0.9 % IV SOLN: Freq: Once | INTRAVENOUS | Status: DC

## 2023-11-04 NOTE — Patient Instructions (Signed)

## 2023-11-10 ENCOUNTER — Other Ambulatory Visit: Payer: 59

## 2023-11-10 ENCOUNTER — Ambulatory Visit: Payer: 59 | Admitting: Hematology and Oncology

## 2023-11-10 ENCOUNTER — Ambulatory Visit: Payer: 59

## 2023-11-12 ENCOUNTER — Ambulatory Visit: Payer: 59

## 2023-11-17 MED FILL — Fosaprepitant Dimeglumine For IV Infusion 150 MG (Base Eq): INTRAVENOUS | Qty: 5 | Status: AC

## 2023-11-17 NOTE — Progress Notes (Unsigned)
 [facility] CONSULT NOTE  Patient Care Team: Lind Covert, MD as PCP - General (Family Medicine) Thomasene Ripple, DO as PCP - Cardiology (Cardiology) Pershing Proud, RN as Oncology Nurse Navigator Donnelly Angelica, RN as Oncology Nurse Navigator Rachel Moulds, MD as Consulting Physician (Hematology and Oncology) Manus Rudd, MD as Consulting Physician (General Surgery) Lonie Peak, MD as Attending Physician (Radiation Oncology)  CHIEF COMPLAINTS/PURPOSE OF CONSULTATION:  Newly diagnosed breast cancer  HISTORY OF PRESENTING ILLNESS:  Martha Washington 58 y.o. female is here because of recent diagnosis of left breast cancer  I reviewed her records extensively and collaborated the history with the patient.  SUMMARY OF ONCOLOGIC HISTORY: Oncology History  Malignant neoplasm of upper-outer quadrant of left breast in female, estrogen receptor positive (HCC)  08/04/2023 Mammogram   Further evaluation is suggested for calcifications in the left breast.  There is a group of coarse heterogeneous calcifications associated with an asymmetry in the UPPER OUTER QUADRANT at posterior depth spanning approximately 3.2 x 2.2 x 2.9 cm (AP x transverse x CC). There are linear and branching forms within the large group. There is a possible mass at the posterior margin of the calcifications measuring just under 1 cm in size.   Targeted ultrasound is performed, demonstrating an irregular hypoechoic mass with angular margins and associated calcifications at 2 o'clock 9 cm from the nipple measuring approximately 2.1 x 1.6 x 1.6 cm, demonstrating posterior acoustic shadowing and demonstrating internal power Doppler flow. There is a 0.4 x 0.3 x 0.2 cm satellite mass at 2 o'clock 7 cm from the nipple which is approximately 2 cm removed from the dominant mass.     08/16/2023 Pathology Results     08/22/2023 Initial Diagnosis   Malignant neoplasm of upper-outer quadrant of left breast  in female, estrogen receptor positive (HCC)   08/24/2023 Cancer Staging   Staging form: Breast, AJCC 8th Edition - Clinical stage from 08/24/2023: Stage IB (cT2, cN0, cM0, G2, ER+, PR+, HER2+) - Signed by Rachel Moulds, MD on 08/24/2023 Stage prefix: Initial diagnosis Histologic grading system: 3 grade system Laterality: Left Staged by: Pathologist and managing physician Stage used in treatment planning: Yes National guidelines used in treatment planning: Yes Type of national guideline used in treatment planning: NCCN   08/31/2023 Genetic Testing   Negative [employer] CancerNext+RNAinsight Panel.  Report date is 08/31/2023.   The [employer] CancerNext+RNAinsight Panel includes sequencing, rearrangement analysis, and RNA analysis for the following 39 genes: APC, ATM, BAP1, BARD1, BMPR1A, BRCA1, BRCA2, BRIP1, CDH1, CDKN2A, CHEK2, FH, FLCN, MET, MLH1, MSH2, MSH6, MUTYH, NF1, NTHL1, PALB2, PMS2, PTEN, RAD51C, RAD51D, SMAD4, STK11, TP53, TSC1, TSC2, and VHL (sequencing and deletion/duplication); AXIN2, HOXB13, MBD4, MSH3, POLD1 and POLE (sequencing only); EPCAM and GREM1 (deletion/duplication only).    09/16/2023 -  Chemotherapy   Patient is on Treatment Plan : BREAST  Docetaxel + Carboplatin + Trastuzumab + Pertuzumab  (TCHP) q21d       Discussed the use of AI scribe software for clinical note transcription with the patient, who gave verbal consent to proceed. History of Present Illness Martha Washington is a 58 year old female undergoing chemotherapy who presents with persistent diarrhea and medication side effects.  She experiences persistent diarrhea lasting about eleven days following chemotherapy treatments, with episodes occurring five to six times a day, sometimes with urgency leading to incontinence. During these episodes, she has a lack of appetite and difficulty drinking fluids, although she manages to maintain hydration with assistance. She  uses Imodium, taking one tablet in the morning and one at  night, but finds this regimen insufficient.  She describes an itching sensation in the port area, attributing it to sensitivity to alcohol used during cleaning. Moisturizing helps alleviate the dryness and itching. She experienced a burning sensation during the administration of an IV anti-nausea medication, identified as Aprepitant, which was resolved by slowing the infusion rate. She has not experienced vomiting since the initial chemotherapy treatment, attributing this to the effectiveness of her anti-nausea regimen. She does not take Zyprexa due to adverse effects, specifically sedation leading to bedwetting, and instead uses Compazine for nausea management. She is very sensitive to medications, with Benadryl causing significant drowsiness during infusions.  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 She has a one-year-old child and did not ride during her pregnancy. She describes the horses as requiring significant care and attention, noting their tendency to injure themselves.  No tingling or numbness in her hands or feet, and no issues with breathing or urination.   Rest of the pertinent 10 point ROS reviewed and neg.  MEDICAL HISTORY:  Past Medical History:  Diagnosis Date   Atrial fibrillation with RVR (HCC)    GERD (gastroesophageal reflux disease)    no meds   High cholesterol    Menorrhagia 07/13/2013   Missed abortion    resolved - no surgery required   New onset atrial fibrillation (HCC) 08/04/2017   with RVR to the 160s; S/P hammertoe OR/notes 08/04/2017   S/P bunionectomy 08/04/2017   SVD (spontaneous vaginal delivery) X 2   Tachycardia 08/04/2017    SURGICAL HISTORY: Past Surgical History:  Procedure Laterality Date   ABDOMINAL HYSTERECTOMY     BACK SURGERY     BREAST BIOPSY Left 08/16/2023   [place] LT BREAST BX W LOC DEV 1ST LESION IMG BX SPEC US GUIDE 08/16/2023 [facility]MO[facility]   BREAST BIOPSY Left 09/12/2023   [place] LT  BREAST BX W LOC DEV 1ST LESION IMG BX SPEC US GUIDE 09/12/2023 [facility]AMMOGR[facility]   BUNIONECTOMY WITH HAMMERTOE RECONSTRUCTION Right 03/2017   BUNIONECTOMY WITH HAMMERTOE RECONSTRUCTION Left 08/04/2017   BUNIONECTOMY WITH HAMMERTOE RECONSTRUCTION Left 08/04/2017   Procedure: Left modified McBride and Lapidus bunion corrections; Left 2-3 Weil and hammertoe corrections;  Surgeon: Toni Arthurs, MD;  Location: [facility];  Service: Orthopedics;  Laterality: Left;   PORTACATH PLACEMENT Right 09/15/2023   Procedure: INSERTION PORT-A-CATH WITH ULTASOUND GUIDANCE;  Surgeon: Manus Rudd, MD;  Location: [facility];  Service: General;  Laterality: Right;  Leave port accessed   ROBOTIC ASSISTED TOTAL HYSTERECTOMY WITH BILATERAL SALPINGO OOPHERECTOMY N/A 07/13/2013   Procedure: ROBOTIC ASSISTED TOTAL HYSTERECTOMY WITH BILATERAL SALPINGECTOMY;  Surgeon: Esmeralda Arthur, MD;  Location: [facility];  Service: Gynecology;  Laterality: N/A;   SHOULDER ARTHROSCOPY W/ ROTATOR CUFF REPAIR Right 2010   SHOULDER ARTHROSCOPY WITH ROTATOR CUFF REPAIR Right 10/17/2014   Procedure: SHOULDER ARTHROSCOPY WITH ROTATOR CUFF REPAIR;  Surgeon: Mckinley Jewel, MD;  Location: [facility];  Service: Orthopedics;  Laterality: Right;   TUBAL LIGATION     WEIL OSTEOTOMY Left 08/04/2017   Procedure: Left 2nd and 3rd weil osteotomy;  Surgeon: Toni Arthurs, MD;  Location: [facility];  Service: Orthopedics;  Laterality: Left;   WISDOM TOOTH EXTRACTION      SOCIAL HISTORY: Social History   Socioeconomic History   Marital status: Married    Spouse name: Loraine Leriche  Number of children: Not on file   Years of education: Not on file   Highest education level: Not on file  Occupational History   Not on file  Tobacco Use   Smoking status: Never   Smokeless tobacco: Never  Vaping Use   Vaping status: Never Used  Substance and Sexual Activity   Alcohol use: No   Drug use: No   Sexual  activity: Not on file  Other Topics Concern   Not on file  Social History Narrative   Lives with husband   Right Handed   Drinks 1 cup caffeine daily   Social Drivers of Health   Financial Resource Strain: Not on file  Food Insecurity: Not on file  Transportation Needs: Not on file  Physical Activity: Not on file  Stress: Not on file  Social Connections: Unknown (12/22/2021)   Received from [facility]   Social Network    Social Network: Not on file  Intimate Partner Violence: Unknown (11/13/2021)   Received from [facility]   HITS    Physically Hurt: Not on file    Insult or Talk Down To: Not on file    Threaten Physical Harm: Not on file    Scream or Curse: Not on file    FAMILY HISTORY: Family History  Problem Relation Age of Onset   Hypertension Mother    Hypertension Father    Bone cancer Brother 68       or other primary?    ALLERGIES:  is allergic to azithromycin, morphine and codeine, and oxycodone.  MEDICATIONS:  Current Outpatient Medications  Medication Sig Dispense Refill   Ascorbic Acid (VITAMIN C) 1000 MG tablet Take 1,000 mg by mouth daily.     b complex vitamins capsule Take 1 capsule by mouth daily.     dexamethasone (DECADRON) 4 MG tablet Take 2 tabs by mouth 2 times daily starting day before chemo. Then take 2 tabs daily for 2 days starting day after chemo. Take with food. 30 tablet 1   escitalopram (LEXAPRO) 10 MG tablet Take 1 tablet (10 mg total) by mouth daily. 90 tablet 0   lidocaine-prilocaine (EMLA) cream Apply to affected area once 30 g 3   ondansetron (ZOFRAN) 8 MG tablet Take 1 tablet (8 mg total) by mouth every 8 (eight) hours as needed for nausea or vomiting. Start on the third day after chemotherapy. 30 tablet 1   prochlorperazine (COMPAZINE) 10 MG tablet Take 1 tablet (10 mg total) by mouth every 6 (six) hours as needed for nausea or vomiting. 30 tablet 1   VITAMIN D PO Take by mouth.     ZINC CITRATE PO  Take by mouth.     No current facility-administered medications for this visit.    REVIEW OF SYSTEMS:   Constitutional: Denies fevers, chills or abnormal night sweats Eyes: Denies blurriness of vision, double vision or watery eyes Ears, nose, mouth, throat, and face: Denies mucositis or sore throat Respiratory: Denies cough, dyspnea or wheezes Cardiovascular: Denies palpitation, chest discomfort or lower extremity swelling Gastrointestinal:  Denies nausea, heartburn or change in bowel habits Skin: Denies abnormal skin rashes Lymphatics: Denies new lymphadenopathy or easy bruising Neurological:Denies numbness, tingling or new weaknesses Behavioral/Psych: Mood is stable, no new changes  Breast: Denies any palpable lumps or discharge All other systems were reviewed with the patient and are negative.  PHYSICAL EXAMINATION: ECOG PERFORMANCE STATUS: 0 - Asymptomatic  Vitals:   11/18/23 0842  BP: 125/63  Pulse: 77  Resp: 17  Temp: (!) 97.3 F (36.3 C)  SpO2: 96%       Filed Weights   11/18/23 0842  Weight: 195 lb (88.5 kg)        GENERAL:alert, no distress and comfortable SKIN: skin color, texture, turgor are normal, no rashes or significant lesions Chest: CTA bilaterally Breast mass: Breast mass responding well. No LE edema  LABORATORY DATA:  I have reviewed the data as listed Lab Results  Component Value Date   WBC 7.1 11/18/2023   HGB 11.7 (L) 11/18/2023   HCT 34.8 (L) 11/18/2023   MCV 84.9 11/18/2023   PLT 254 11/18/2023   Lab Results  Component Value Date   NA 141 11/18/2023   K 3.3 (L) 11/18/2023   CL 109 11/18/2023   CO2 26 11/18/2023    RADIOGRAPHIC STUDIES: I have personally reviewed the radiological reports and agreed with the findings in the report.  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 On Neoadj TCHP. Baseline ECHO with EF of 65-70%. Assessment & Plan Breast cancer Undergoing chemotherapy with positive response; .  - Continue chemotherapy as planned. - Discussed surgical options post-chemotherapy, including lumpectomy and mastectomy. - Consider radiation therapy post-surgery - Anti estrogen therapy to follow radiation.  Chemotherapy-induced diarrhea Experiences diarrhea post-chemotherapy with urgency and incontinence. Current Imodium regimen ineffective. - Instruct to take two Imodium after the first episode of diarrhea.  Chemotherapy-induced nausea Nausea managed with Compazine. Zyprexa causes sedation and enuresis. Sensitive to medications. - Continue Compazine for nausea management. - Remove Zyprexa from the medication list. - Administer Benadryl with caution.  Chemotherapy port site irritation Itching and burning at port site likely due to alcohol use and dehydration. - Advise moisturizing the port site.  Chemotherapy-induced peripheral neuropathy No current symptoms of tingling or numbness.  Follow-up Scheduled for weekly follow-up appointments for hydration. - Continue weekly follow-up appointments for fluid administration.    All questions were answered. The patient knows to call the clinic with any problems, questions or concerns.    Rachel Moulds, MD 11/18/23

## 2023-11-18 ENCOUNTER — Inpatient Hospital Stay: Payer: 59 | Admitting: Hematology and Oncology

## 2023-11-18 ENCOUNTER — Inpatient Hospital Stay: Payer: 59 | Attending: Hematology and Oncology

## 2023-11-18 ENCOUNTER — Inpatient Hospital Stay: Payer: 59

## 2023-11-18 VITALS — BP 125/63 | HR 77 | Temp 97.3°F | Resp 17 | Wt 195.0 lb

## 2023-11-18 DIAGNOSIS — Z7952 Long term (current) use of systemic steroids: Secondary | ICD-10-CM | POA: Insufficient documentation

## 2023-11-18 DIAGNOSIS — C50412 Malignant neoplasm of upper-outer quadrant of left female breast: Secondary | ICD-10-CM

## 2023-11-18 DIAGNOSIS — T451X5A Adverse effect of antineoplastic and immunosuppressive drugs, initial encounter: Secondary | ICD-10-CM | POA: Insufficient documentation

## 2023-11-18 DIAGNOSIS — I4891 Unspecified atrial fibrillation: Secondary | ICD-10-CM | POA: Diagnosis not present

## 2023-11-18 DIAGNOSIS — E7800 Pure hypercholesterolemia, unspecified: Secondary | ICD-10-CM | POA: Diagnosis not present

## 2023-11-18 DIAGNOSIS — R630 Anorexia: Secondary | ICD-10-CM | POA: Diagnosis not present

## 2023-11-18 DIAGNOSIS — Z79899 Other long term (current) drug therapy: Secondary | ICD-10-CM | POA: Diagnosis not present

## 2023-11-18 DIAGNOSIS — Z170 Estrogen receptor positive status [ER+]: Secondary | ICD-10-CM | POA: Diagnosis not present

## 2023-11-18 DIAGNOSIS — E860 Dehydration: Secondary | ICD-10-CM

## 2023-11-18 DIAGNOSIS — K219 Gastro-esophageal reflux disease without esophagitis: Secondary | ICD-10-CM | POA: Insufficient documentation

## 2023-11-18 DIAGNOSIS — K521 Toxic gastroenteritis and colitis: Secondary | ICD-10-CM | POA: Insufficient documentation

## 2023-11-18 LAB — CBC WITH DIFFERENTIAL (CANCER CENTER ONLY)
Abs Immature Granulocytes: 0.02 10*3/uL (ref 0.00–0.07)
Basophils Absolute: 0 10*3/uL (ref 0.0–0.1)
Basophils Relative: 0 %
Eosinophils Absolute: 0 10*3/uL (ref 0.0–0.5)
Eosinophils Relative: 0 %
HCT: 34.8 % — ABNORMAL LOW (ref 36.0–46.0)
Hemoglobin: 11.7 g/dL — ABNORMAL LOW (ref 12.0–15.0)
Immature Granulocytes: 0 %
Lymphocytes Relative: 15 %
Lymphs Abs: 1 10*3/uL (ref 0.7–4.0)
MCH: 28.5 pg (ref 26.0–34.0)
MCHC: 33.6 g/dL (ref 30.0–36.0)
MCV: 84.9 fL (ref 80.0–100.0)
Monocytes Absolute: 0.3 10*3/uL (ref 0.1–1.0)
Monocytes Relative: 4 %
Neutro Abs: 5.8 10*3/uL (ref 1.7–7.7)
Neutrophils Relative %: 81 %
Platelet Count: 254 10*3/uL (ref 150–400)
RBC: 4.1 MIL/uL (ref 3.87–5.11)
RDW: 15.7 % — ABNORMAL HIGH (ref 11.5–15.5)
WBC Count: 7.1 10*3/uL (ref 4.0–10.5)
nRBC: 0 % (ref 0.0–0.2)

## 2023-11-18 LAB — CMP (CANCER CENTER ONLY)
ALT: 20 U/L (ref 0–44)
AST: 17 U/L (ref 15–41)
Albumin: 4.2 g/dL (ref 3.5–5.0)
Alkaline Phosphatase: 85 U/L (ref 38–126)
Anion gap: 6 (ref 5–15)
BUN: 16 mg/dL (ref 6–20)
CO2: 26 mmol/L (ref 22–32)
Calcium: 9.3 mg/dL (ref 8.9–10.3)
Chloride: 109 mmol/L (ref 98–111)
Creatinine: 0.6 mg/dL (ref 0.44–1.00)
GFR, Estimated: >60 mL/min (ref >60)
Glucose, Bld: 166 mg/dL — ABNORMAL HIGH (ref 70–99)
Potassium: 3.3 mmol/L — ABNORMAL LOW (ref 3.5–5.1)
Sodium: 141 mmol/L (ref 135–145)
Total Bilirubin: 0.5 mg/dL (ref 0.0–1.2)
Total Protein: 6.7 g/dL (ref 6.5–8.1)

## 2023-11-18 MED ORDER — SODIUM CHLORIDE 0.9% FLUSH
10.0000 mL | Freq: Once | INTRAVENOUS | Status: AC | PRN
  Administered 2023-11-18: 10 mL

## 2023-11-18 MED ORDER — DEXAMETHASONE SODIUM PHOSPHATE 100 MG/10ML IJ SOLN
10.0000 mg | Freq: Once | INTRAMUSCULAR | Status: DC
  Filled 2023-11-18: qty 1

## 2023-11-18 MED ORDER — PALONOSETRON HCL INJECTION 0.25 MG/5ML
0.2500 mg | Freq: Once | INTRAVENOUS | Status: AC
  Administered 2023-11-18: 0.25 mg via INTRAVENOUS
  Filled 2023-11-18: qty 5

## 2023-11-18 MED ORDER — SODIUM CHLORIDE 0.9 % IV SOLN
442.8000 mg | Freq: Once | INTRAVENOUS | Status: AC
  Administered 2023-11-18: 440 mg via INTRAVENOUS
  Filled 2023-11-18: qty 44

## 2023-11-18 MED ORDER — DIPHENHYDRAMINE HCL 25 MG PO CAPS
50.0000 mg | ORAL_CAPSULE | Freq: Once | ORAL | Status: AC
  Administered 2023-11-18: 50 mg via ORAL
  Filled 2023-11-18: qty 2

## 2023-11-18 MED ORDER — PERTUZUMAB CHEMO INJECTION 420 MG/14ML
420.0000 mg | Freq: Once | INTRAVENOUS | Status: AC
  Administered 2023-11-18: 420 mg via INTRAVENOUS
  Filled 2023-11-18: qty 14

## 2023-11-18 MED ORDER — HEPARIN SOD (PORK) LOCK FLUSH 100 UNIT/ML IV SOLN
500.0000 [IU] | Freq: Once | INTRAVENOUS | Status: AC | PRN
  Administered 2023-11-18: 500 [IU]

## 2023-11-18 MED ORDER — SODIUM CHLORIDE 0.9 % IV SOLN
150.0000 mg | Freq: Once | INTRAVENOUS | Status: AC
  Administered 2023-11-18: 150 mg via INTRAVENOUS
  Filled 2023-11-18: qty 150

## 2023-11-18 MED ORDER — TRASTUZUMAB-ANNS CHEMO 150 MG IV SOLR
6.0000 mg/kg | Freq: Once | INTRAVENOUS | Status: AC
  Administered 2023-11-18: 525 mg via INTRAVENOUS
  Filled 2023-11-18: qty 25

## 2023-11-18 MED ORDER — SODIUM CHLORIDE 0.9 % IV SOLN
10.0000 mg | Freq: Once | INTRAVENOUS | Status: AC
  Administered 2023-11-18: 10 mg via INTRAVENOUS
  Filled 2023-11-18: qty 10

## 2023-11-18 MED ORDER — SODIUM CHLORIDE 0.9% FLUSH
10.0000 mL | INTRAVENOUS | Status: DC | PRN
  Administered 2023-11-18: 10 mL

## 2023-11-18 MED ORDER — SODIUM CHLORIDE 0.9 % IV SOLN
60.0000 mg/m2 | Freq: Once | INTRAVENOUS | Status: AC
  Administered 2023-11-18: 119 mg via INTRAVENOUS
  Filled 2023-11-18: qty 11.9

## 2023-11-18 MED ORDER — ACETAMINOPHEN 325 MG PO TABS
650.0000 mg | ORAL_TABLET | Freq: Once | ORAL | Status: AC
  Administered 2023-11-18: 650 mg via ORAL
  Filled 2023-11-18: qty 2

## 2023-11-18 MED ORDER — SODIUM CHLORIDE 0.9 % IV SOLN: INTRAVENOUS | Status: DC

## 2023-11-18 MED ORDER — DEXAMETHASONE SODIUM PHOSPHATE 10 MG/ML IJ SOLN
10.0000 mg | Freq: Once | INTRAMUSCULAR | Status: DC
  Filled 2023-11-18: qty 1

## 2023-11-18 NOTE — Assessment & Plan Note (Addendum)
 Invasive Ductal Carcinoma, Left Breast Newly diagnosed, HER2 positive, estrogen receptor positive, with a primary mass measuring 2.1 cm and a satellite nodule of 4 mm. On Neoadj TCHP. Baseline ECHO with EF of 65-70%. Assessment & Plan Breast cancer Undergoing chemotherapy with positive response; .  - Continue chemotherapy as planned. - Discussed surgical options post-chemotherapy, including lumpectomy and mastectomy. - Consider radiation therapy post-surgery - Anti estrogen therapy to follow radiation.  Chemotherapy-induced diarrhea Experiences diarrhea post-chemotherapy with urgency and incontinence. Current Imodium regimen ineffective. - Instruct to take two Imodium after the first episode of diarrhea.  Chemotherapy-induced nausea Nausea managed with Compazine. Zyprexa causes sedation and enuresis. Sensitive to medications. - Continue Compazine for nausea management. - Remove Zyprexa from the medication list. - Administer Benadryl with caution.  Chemotherapy port site irritation Itching and burning at port site likely due to alcohol use and dehydration. - Advise moisturizing the port site.  Chemotherapy-induced peripheral neuropathy No current symptoms of tingling or numbness.  Follow-up Scheduled for weekly follow-up appointments for hydration. - Continue weekly follow-up appointments for fluid administration.

## 2023-11-18 NOTE — Patient Instructions (Signed)
 [facility] - A DEPT OF [facility]  Discharge Instructions: Thank you for choosing [facility] to provide your oncology and hematology care.   If you have a lab appointment with the Cancer Center, please go directly to the Cancer Center and check in at the registration area.   Wear comfortable clothing and clothing appropriate for easy access to any Portacath or PICC line.   We strive to give you quality time with your provider. You may need to reschedule your appointment if you arrive late (15 or more minutes).  Arriving late affects you and other patients whose appointments are after yours.  Also, if you miss three or more appointments without notifying the office, you may be dismissed from the clinic at the provider's discretion.      For prescription refill requests, have your pharmacy contact our office and allow 72 hours for refills to be completed.    Today you received the following chemotherapy and/or immunotherapy agents: Kanjinti/Perjeta/Docetaxel/Carboplatin      To help prevent nausea and vomiting after your treatment, we encourage you to take your nausea medication as directed.  BELOW ARE SYMPTOMS THAT SHOULD BE REPORTED IMMEDIATELY: *FEVER GREATER THAN 100.4 F (38 C) OR HIGHER *CHILLS OR SWEATING *NAUSEA AND VOMITING THAT IS NOT CONTROLLED WITH YOUR NAUSEA MEDICATION *UNUSUAL SHORTNESS OF BREATH *UNUSUAL BRUISING OR BLEEDING *URINARY PROBLEMS (pain or burning when urinating, or frequent urination) *BOWEL PROBLEMS (unusual diarrhea, constipation, pain near the anus) TENDERNESS IN MOUTH AND THROAT WITH OR WITHOUT PRESENCE OF ULCERS (sore throat, sores in mouth, or a toothache) UNUSUAL RASH, SWELLING OR PAIN  UNUSUAL VAGINAL DISCHARGE OR ITCHING   Items with * indicate a potential emergency and should be followed up as soon as possible or go to the Emergency Department if any problems should occur.  Please show the CHEMOTHERAPY  ALERT CARD or IMMUNOTHERAPY ALERT CARD at check-in to the Emergency Department and triage nurse.  Should you have questions after your visit or need to cancel or reschedule your appointment, please contact [facility] - A DEPT OF [facility]  Dept: [phone]  and follow the prompts.  Office hours are 8:00 a.m. to 4:30 p.m. Monday - Friday. Please note that voicemails left after 4:00 p.m. may not be returned until the following business day.  We are closed weekends and major holidays. You have access to a nurse at all times for urgent questions. Please call the main number to the clinic Dept: [phone] and follow the prompts.   For any non-urgent questions, you may also contact your provider using MyChart. We now offer e-Visits for anyone 50 and older to request care online for non-urgent symptoms. For details visit [email or web address].   Also download the MyChart app! Go to the app store, search "MyChart", open the app, select [facility], and log in with your MyChart username and password.

## 2023-11-21 ENCOUNTER — Inpatient Hospital Stay: Payer: 59

## 2023-11-21 VITALS — BP 109/66 | HR 70 | Temp 98.4°F | Resp 18

## 2023-11-21 DIAGNOSIS — E860 Dehydration: Secondary | ICD-10-CM

## 2023-11-21 DIAGNOSIS — Z170 Estrogen receptor positive status [ER+]: Secondary | ICD-10-CM

## 2023-11-21 DIAGNOSIS — C50412 Malignant neoplasm of upper-outer quadrant of left female breast: Secondary | ICD-10-CM | POA: Diagnosis not present

## 2023-11-21 MED ORDER — PEGFILGRASTIM-CBQV 6 MG/0.6ML ~~LOC~~ SOSY
6.0000 mg | PREFILLED_SYRINGE | Freq: Once | SUBCUTANEOUS | Status: AC
  Administered 2023-11-21: 6 mg via SUBCUTANEOUS
  Filled 2023-11-21: qty 0.6

## 2023-11-25 ENCOUNTER — Inpatient Hospital Stay: Payer: 59

## 2023-11-25 VITALS — BP 109/72 | HR 70 | Temp 99.1°F | Resp 16

## 2023-11-25 DIAGNOSIS — E860 Dehydration: Secondary | ICD-10-CM

## 2023-11-25 DIAGNOSIS — C50412 Malignant neoplasm of upper-outer quadrant of left female breast: Secondary | ICD-10-CM | POA: Diagnosis not present

## 2023-11-25 MED ORDER — SODIUM CHLORIDE 0.9 % IV SOLN
Freq: Once | INTRAVENOUS | Status: AC
Start: 2023-11-25 — End: 2023-11-25

## 2023-11-25 MED ORDER — ONDANSETRON HCL 4 MG/2ML IJ SOLN
8.0000 mg | Freq: Once | INTRAMUSCULAR | Status: AC
  Administered 2023-11-25: 8 mg via INTRAVENOUS
  Filled 2023-11-25: qty 4

## 2023-11-25 MED ORDER — ONDANSETRON HCL 40 MG/20ML IJ SOLN: Freq: Once | INTRAMUSCULAR | Status: DC

## 2023-11-25 MED ORDER — HEPARIN SOD (PORK) LOCK FLUSH 100 UNIT/ML IV SOLN
500.0000 [IU] | Freq: Once | INTRAVENOUS | Status: AC | PRN
  Administered 2023-11-25: 500 [IU]

## 2023-11-25 MED ORDER — SODIUM CHLORIDE 0.9% FLUSH
10.0000 mL | Freq: Once | INTRAVENOUS | Status: AC | PRN
  Administered 2023-11-25: 10 mL

## 2023-11-25 NOTE — Patient Instructions (Signed)
 Nausea, Adult Nausea is feeling like you may vomit. Feeling like you may vomit is usually not serious, but it may be an early sign of a more serious medical problem. Vomiting is when stomach contents forcefully come out of your mouth. If you vomit, or if you are not able to drink enough fluids, you may not have enough water  in your body (get dehydrated). If you do not have enough water  in your body, you may: Feel tired. Feel thirsty. Have a dry mouth. Have cracked lips. Pee (urinate) less often. Older adults and people who have other diseases or a weak body defense system (immune system) have a higher risk of not having enough water  in the body. The main goals of treating this condition are: To relieve your nausea. To ensure your nausea occurs less often. To prevent vomiting and losing too much fluid. Follow these instructions at home: Watch your symptoms for any changes. Tell your doctor about them. Eating and drinking     Take an ORS (oral rehydration solution). This is a drink that is sold at pharmacies and stores. Drink clear fluids in small amounts as you are able. These include: Water . Ice chips. Fruit juice that has water  added (diluted fruit juice). Low-calorie sports drinks. Eat bland, easy-to-digest foods in small amounts as you are able, such as: Bananas. Applesauce. Rice. Low-fat (lean) meats. Toast. Crackers. Avoid drinking fluids that have a lot of sugar or caffeine in them. This includes energy drinks, sports drinks, and soda. Avoid alcohol. Avoid spicy or fatty foods. General instructions Take over-the-counter and prescription medicines only as told by your doctor. Rest at home while you get better. Drink enough fluid to keep your pee (urine) pale yellow. Take slow and deep breaths when you feel like you may vomit. Avoid food or things that have strong smells. Wash your hands often with soap and water  for at least 20 seconds. If you cannot use soap and water ,  use hand sanitizer. Make sure that everyone in your home washes their hands well and often. Keep all follow-up visits. Contact a doctor if: You feel worse. You feel like you may vomit and this lasts for more than 2 days. You vomit. You are not able to drink fluids without vomiting. You have new symptoms. You have a fever. You have a headache. You have muscle cramps. You have a rash. You have pain while peeing. You feel light-headed or dizzy. Get help right away if: You have pain in your chest, neck, arm, or jaw. You feel very weak or you faint. You have vomit that is bright red or looks like coffee grounds. You have bloody or black poop (stools) or poop that looks like tar. You have a very bad headache, a stiff neck, or both. You have very bad pain, cramping, or bloating in your belly (abdomen). You have trouble breathing or you are breathing very quickly. Your heart is beating very quickly. Your skin feels cold and clammy. You feel confused. You have signs of losing too much water  in your body, such as: Dark pee, very little pee, or no pee. Cracked lips. Dry mouth. Sunken eyes. Sleepiness. Weakness. These symptoms may be an emergency. Get help right away. Call 911. Do not wait to see if the symptoms will go away. Do not drive yourself to the hospital. Summary Nausea is feeling like you are about vomit. If you vomit, or if you are not able to drink enough fluids, you may not have enough water  in  your body (get dehydrated). Eat and drink what your doctor tells you. Take over-the-counter and prescription medicines only as told by your doctor. Contact a doctor right away if your symptoms get worse or you have new symptoms. Keep all follow-up visits. This information is not intended to replace advice given to you by your health care provider. Make sure you discuss any questions you have with your health care provider. Rehydration, Adult Rehydration is the replacement of  fluids, salts, and minerals in the body (electrolytes) that are lost during dehydration. Dehydration is when there is not enough water  or other fluids in the body. This happens when you lose more fluids than you take in. Common causes of dehydration include: Not drinking enough fluids. This can occur when you are ill or doing activities that require a lot of energy, especially in hot weather. Conditions that cause loss of water  or other fluids. These include diarrhea, vomiting, sweating, and urinating a lot. Other illnesses, such as fever or infection. Certain medicines, such as those that remove excess fluid from the body (diuretics). Symptoms of mild or moderate dehydration may include thirst, dry lips and mouth, and dizziness. Symptoms of severe dehydration may include increased heart rate, confusion, fainting, and not urinating. In severe cases, you may need to get fluids through an IV at the hospital. For mild or moderate cases, you can usually rehydrate at home by drinking certain fluids as told by your health care provider. What are the risks? Your health care provider will talk with you about risks. Your health care provider will talk with you about risks. This may include taking in too much fluid (overhydration). This is rare. Overhydration can cause an imbalance of electrolytes in the body, kidney failure, or a decrease in salt (sodium) levels in the body. Supplies needed: You will need an oral rehydration solution (ORS) if your health care provider tells you to use one. This is a drink to treat dehydration. It can be found in pharmacies and retail stores. How to rehydrate Fluids Follow instructions from your health care provider about what to drink. The kind of fluid and the amount you should drink depend on your condition. In general, you should choose drinks that you prefer. If told by your health care provider, drink an ORS. Make an ORS by following instructions on the package. Start by  drinking small amounts, about  cup (120 mL) every 5-10 minutes. Slowly increase how much you drink until you have taken in the amount recommended by your health care provider. Drink enough clear fluids to keep your urine pale yellow. If you were told to drink an ORS, finish it first, then start slowly drinking other clear fluids. Drink fluids such as: Water . This includes sparkling and flavored water . Drinking only water  can lead to having too little sodium in your body (hyponatremia). Follow the advice of your health care provider. Water  from ice chips you suck on. Fruit juice with water  added to it (diluted). Sports drinks. Hot or cold herbal teas. Broth-based soups. Milk or milk products. Food Follow instructions from your health care provider about what to eat while you rehydrate. Your health care provider may recommend that you slowly begin eating regular foods in small amounts. Eat foods that contain a healthy balance of electrolytes, such as bananas, oranges, potatoes, tomatoes, and spinach. Avoid foods that are greasy or contain a lot of sugar. In some cases, you may get nutrition through a feeding tube that is passed through your nose and  into your stomach (nasogastric tube, or NG tube). This may be done if you have uncontrolled vomiting or diarrhea. Drinks to avoid  Certain drinks may make dehydration worse. While you rehydrate, avoid drinking alcohol. How to tell if you are recovering from dehydration You may be getting better if: You are urinating more often than before you started rehydrating. Your urine is pale yellow. Your energy level improves. You vomit less often. You have diarrhea less often. Your appetite improves or returns to normal. You feel less dizzy or light-headed. Your skin tone and color start to look more normal. Follow these instructions at home: Take over-the-counter and prescription medicines only as told by your health care provider. Do not take sodium  tablets. Doing this can lead to having too much sodium in your body (hypernatremia). Contact a health care provider if: You continue to have symptoms of mild or moderate dehydration, such as: Thirst. Dry lips. Slightly dry mouth. Dizziness. Dark urine or less urine than normal. Muscle cramps. You continue to vomit or have diarrhea. Get help right away if: You have symptoms of dehydration that get worse. You have a fever. You have a severe headache. You have been vomiting and have problems, such as: Your vomiting gets worse or does not go away. Your vomit includes blood or green matter (bile). You cannot eat or drink without vomiting. You have problems with urination or bowel movements, such as: Diarrhea that gets worse or does not go away. Blood in your stool (feces). This may cause stool to look black and tarry. Not urinating, or urinating only a small amount of very dark urine, within 6-8 hours. You have trouble breathing. You have symptoms that get worse with treatment. These symptoms may be an emergency. Get help right away. Call 911. Do not wait to see if the symptoms will go away. Do not drive yourself to the hospital. This information is not intended to replace advice given to you by your health care provider. Make sure you discuss any questions you have with your health care provider. Document Revised: 12/07/2021 Document Reviewed: 12/07/2021 Elsevier Patient Education  2024 Elsevier Inc. Document Revised: 01/30/2021 Document Reviewed: 01/30/2021 Elsevier Patient Education  2024 [employer].

## 2023-12-06 ENCOUNTER — Other Ambulatory Visit: Payer: Self-pay | Admitting: Hematology and Oncology

## 2023-12-08 MED FILL — Fosaprepitant Dimeglumine For IV Infusion 150 MG (Base Eq): INTRAVENOUS | Qty: 5 | Status: AC

## 2023-12-08 MED FILL — Dexamethasone Sodium Phosphate Inj 100 MG/10ML: INTRAMUSCULAR | Qty: 1 | Status: AC

## 2023-12-08 NOTE — Progress Notes (Signed)
 [facility] CONSULT NOTE  Patient Care Team: Errol Heaps, MD as PCP - General (Family Medicine) Martha Morin, DO as PCP - Cardiology (Cardiology) Martha Bo, RN as Oncology Nurse Navigator Martha Hsu, RN as Oncology Nurse Navigator Martha Arms, MD as Consulting Physician (Hematology and Oncology) Martha Ebbing, MD as Consulting Physician (General Surgery) Martha Dawes, MD as Attending Physician (Radiation Oncology)  CHIEF COMPLAINTS/PURPOSE OF CONSULTATION:  Newly diagnosed breast cancer  HISTORY OF PRESENTING ILLNESS:  Martha Washington 58 y.o. female is here because of recent diagnosis of left breast cancer  I reviewed her records extensively and collaborated the history with the patient.  SUMMARY OF ONCOLOGIC HISTORY: Oncology History  Malignant neoplasm of upper-outer quadrant of left breast in female, estrogen receptor positive (HCC)  08/04/2023 Mammogram   Further evaluation is suggested for calcifications in the left breast.  There is a group of coarse heterogeneous calcifications associated with an asymmetry in the UPPER OUTER QUADRANT at posterior depth spanning approximately 3.2 x 2.2 x 2.9 cm (AP x transverse x CC). There are linear and branching forms within the large group. There is a possible mass at the posterior margin of the calcifications measuring just under 1 cm in size.   Targeted ultrasound is performed, demonstrating an irregular hypoechoic mass with angular margins and associated calcifications at 2 o'clock 9 cm from the nipple measuring approximately 2.1 x 1.6 x 1.6 cm, demonstrating posterior acoustic shadowing and demonstrating internal power Doppler flow. There is a 0.4 x 0.3 x 0.2 cm satellite mass at 2 o'clock 7 cm from the nipple which is approximately 2 cm removed from the dominant mass.     08/16/2023 Pathology Results     08/22/2023 Initial Diagnosis   Malignant neoplasm of upper-outer quadrant of left breast  in female, estrogen receptor positive (HCC)   08/24/2023 Cancer Staging   Staging form: Breast, AJCC 8th Edition - Clinical stage from 08/24/2023: Stage IB (cT2, cN0, cM0, G2, ER+, PR+, HER2+) - Signed by Martha Arms, MD on 08/24/2023 Stage prefix: Initial diagnosis Histologic grading system: 3 grade system Laterality: Left Staged by: Pathologist and managing physician Stage used in treatment planning: Yes National guidelines used in treatment planning: Yes Type of national guideline used in treatment planning: NCCN   08/31/2023 Genetic Testing   Negative [facility] CancerNext+RNAinsight Panel.  Report date is 08/31/2023.   The [employer] CancerNext+RNAinsight Panel includes sequencing, rearrangement analysis, and RNA analysis for the following 39 genes: APC, ATM, BAP1, BARD1, BMPR1A, BRCA1, BRCA2, BRIP1, CDH1, CDKN2A, CHEK2, FH, FLCN, MET, MLH1, MSH2, MSH6, MUTYH, NF1, NTHL1, PALB2, PMS2, PTEN, RAD51C, RAD51D, SMAD4, STK11, TP53, TSC1, TSC2, and VHL (sequencing and deletion/duplication); AXIN2, HOXB13, MBD4, MSH3, POLD1 and POLE (sequencing only); EPCAM and GREM1 (deletion/duplication only).    09/16/2023 -  Chemotherapy   Patient is on Treatment Plan : BREAST  Docetaxel  + Carboplatin  + Trastuzumab  + Pertuzumab   (TCHP) q21d       Discussed the use of AI scribe software for clinical note transcription with the patient, who gave verbal consent to proceed. History of Present Illness  Martha Washington is a 58 year old female with breast cancer undergoing chemotherapy who presents for follow-up.  She is undergoing chemotherapy for breast cancer and has noticed an improvement in diarrhea without any new medication. The diarrhea is less urgent and not as explosive as before, although she experienced one episode of explosive diarrhea after a stressful event [place] a ballpark, which she attributes to stress.  This was an isolated incident.  She experiences nausea, which she manages with Compazine  for a few days  following chemotherapy treatments. Fatigue is noted, particularly after receiving a shot on Mondays, which she finds challenging.  There are no new symptoms or changes in her breast, and her port is functioning well without causing any issues such as fevers or chills. She interacts with her grandson, who occasionally bumps into her port area, but she does not report any complications from this.  She would like to delay her chemotherapy next cycle by about a week.   Rest of the pertinent 10 point ROS reviewed and neg.  MEDICAL HISTORY:  Past Medical History:  Diagnosis Date   Atrial fibrillation with RVR (HCC)    GERD (gastroesophageal reflux disease)    no meds   High cholesterol    Menorrhagia 07/13/2013   Missed abortion    resolved - no surgery required   New onset atrial fibrillation (HCC) 08/04/2017   with RVR to the 160s; S/P hammertoe OR/notes 08/04/2017   S/P bunionectomy 08/04/2017   SVD (spontaneous vaginal delivery) X 2   Tachycardia 08/04/2017    SURGICAL HISTORY: Past Surgical History:  Procedure Laterality Date   ABDOMINAL HYSTERECTOMY     BACK SURGERY     BREAST BIOPSY Left 08/16/2023   US  LT BREAST BX W LOC DEV 1ST LESION IMG BX SPEC US  GUIDE 08/16/2023 [facility]MOGRAP[facility]   BREAST BIOPSY Left 09/12/2023   US  LT BREAST BX W LOC DEV 1ST LESION IMG BX SPEC US  GUIDE 09/12/2023 [facility]AMMOGR[facility]   BUNIONECTOMY WITH HAMMERTOE RECONSTRUCTION Right 03/2017   BUNIONECTOMY WITH HAMMERTOE RECONSTRUCTION Left 08/04/2017   BUNIONECTOMY WITH HAMMERTOE RECONSTRUCTION Left 08/04/2017   Procedure: Left modified McBride and Lapidus bunion corrections; Left 2-3 Weil and hammertoe corrections;  Surgeon: Amada Backer, MD;  Location: [facility];  Service: Orthopedics;  Laterality: Left;   PORTACATH PLACEMENT Right 09/15/2023   Procedure: INSERTION PORT-A-CATH WITH ULTASOUND GUIDANCE;  Surgeon: Martha Ebbing, MD;  Location: [facility];  Service: General;   Laterality: Right;  Leave port accessed   ROBOTIC ASSISTED TOTAL HYSTERECTOMY WITH BILATERAL SALPINGO OOPHERECTOMY N/A 07/13/2013   Procedure: ROBOTIC ASSISTED TOTAL HYSTERECTOMY WITH BILATERAL SALPINGECTOMY;  Surgeon: Mckinley Spells, MD;  Location: [facility];  Service: Gynecology;  Laterality: N/A;   SHOULDER ARTHROSCOPY W/ ROTATOR CUFF REPAIR Right 2010   SHOULDER ARTHROSCOPY WITH ROTATOR CUFF REPAIR Right 10/17/2014   Procedure: SHOULDER ARTHROSCOPY WITH ROTATOR CUFF REPAIR;  Surgeon: Sandie Cross, MD;  Location: [facility];  Service: Orthopedics;  Laterality: Right;   TUBAL LIGATION     WEIL OSTEOTOMY Left 08/04/2017   Procedure: Left 2nd and 3rd weil osteotomy;  Surgeon: Amada Backer, MD;  Location: [facility];  Service: Orthopedics;  Laterality: Left;   WISDOM TOOTH EXTRACTION      SOCIAL HISTORY: Social History   Socioeconomic History   Marital status: Married    Spouse name: Lavonia Powers   Number of children: Not on file   Years of education: Not on file   Highest education level: Not on file  Occupational History   Not on file  Tobacco Use   Smoking status: Never   Smokeless tobacco: Never  Vaping Use   Vaping status: Never Used  Substance and Sexual Activity   Alcohol use: No   Drug use: No   Sexual activity: Not on file  Other Topics Concern   Not on file  Social  History Narrative   Lives with husband   Right Handed   Drinks 1 cup caffeine daily   Social Drivers of Corporate investment banker Strain: Not on file  Food Insecurity: Not on file  Transportation Needs: Not on file  Physical Activity: Not on file  Stress: Not on file  Social Connections: Unknown (12/22/2021)   Received [facility]   Social Network    Social Network: Not on file  Intimate Partner Violence: Unknown (11/13/2021)   Received from [facility], [facility]   HITS    Physically Hurt: Not on file    Insult or Talk Down To: Not on file     Threaten Physical Harm: Not on file    Scream or Curse: Not on file    FAMILY HISTORY: Family History  Problem Relation Age of Onset   Hypertension Mother    Hypertension Father    Bone cancer Brother 71       or other primary?    ALLERGIES:  is allergic to azithromycin, morphine and codeine, and oxycodone .  MEDICATIONS:  Current Outpatient Medications  Medication Sig Dispense Refill   Ascorbic Acid (VITAMIN C) 1000 MG tablet Take 1,000 mg by mouth daily.     b complex vitamins capsule Take 1 capsule by mouth daily.     dexamethasone  (DECADRON ) 4 MG tablet Take 2 tabs by mouth 2 times daily starting day before chemo. Then take 2 tabs daily for 2 days starting day after chemo. Take with food. 30 tablet 1   escitalopram  (LEXAPRO ) 10 MG tablet Take 1 tablet (10 mg total) by mouth daily. 90 tablet 0   lidocaine -prilocaine  (EMLA ) cream Apply to affected area once 30 g 3   ondansetron  (ZOFRAN ) 8 MG tablet Take 1 tablet (8 mg total) by mouth every 8 (eight) hours as needed for nausea or vomiting. Start on the third day after chemotherapy. 30 tablet 1   prochlorperazine  (COMPAZINE ) 10 MG tablet Take 1 tablet (10 mg total) by mouth every 6 (six) hours as needed for nausea or vomiting. 30 tablet 1   VITAMIN D PO Take by mouth.     ZINC CITRATE PO Take by mouth.     No current facility-administered medications for this visit.   Facility-Administered Medications Ordered in Other Visits  Medication Dose Route Frequency Provider Last Rate Last Admin   acetaminophen  (TYLENOL ) tablet 650 mg  650 mg Oral Once Kadejah Sandiford, MD       CARBOplatin  (PARAPLATIN ) 440 mg in sodium chloride  0.9 % 250 mL chemo infusion  440 mg Intravenous Once Quincy Prisco, MD       dexamethasone  (DECADRON ) 10 mg in sodium chloride  0.9 % 50 mL IVPB  10 mg Intravenous Once Arleatha Philipps, MD       diphenhydrAMINE  (BENADRYL ) capsule 50 mg  50 mg Oral Once Kobi Mario, MD       DOCEtaxel  (TAXOTERE ) 119 mg in sodium  chloride 0.9 % 250 mL chemo infusion  60 mg/m2 (Order-Specific) Intravenous Once Andra Heslin, MD       fosaprepitant  (EMEND) 150 mg in sodium chloride  0.9 % 145 mL IVPB  150 mg Intravenous Once Coston Mandato, MD       palonosetron  (ALOXI ) injection 0.25 mg  0.25 mg Intravenous Once Keyron Pokorski, MD       pertuzumab  (PERJETA ) 420 mg in sodium chloride  0.9 % 250 mL chemo infusion  420 mg Intravenous Once Hisae Decoursey, MD  trastuzumab -anns (KANJINTI ) 525 mg in sodium chloride  0.9 % 250 mL chemo infusion  6 mg/kg (Order-Specific) Intravenous Once Jacelynn Hayton, MD        REVIEW OF SYSTEMS:   Constitutional: Denies fevers, chills or abnormal night sweats Eyes: Denies blurriness of vision, double vision or watery eyes Ears, nose, mouth, throat, and face: Denies mucositis or sore throat Respiratory: Denies cough, dyspnea or wheezes Cardiovascular: Denies palpitation, chest discomfort or lower extremity swelling Gastrointestinal:  Denies nausea, heartburn or change in bowel habits Skin: Denies abnormal skin rashes Lymphatics: Denies new lymphadenopathy or easy bruising Neurological:Denies numbness, tingling or new weaknesses Behavioral/Psych: Mood is stable, no new changes  Breast: Denies any palpable lumps or discharge All other systems were reviewed with the patient and are negative.  PHYSICAL EXAMINATION: ECOG PERFORMANCE STATUS: 0 - Asymptomatic  Vitals:   12/09/23 0843  BP: 119/64  Pulse: 67  Resp: 16  Temp: 97.6 F (36.4 C)  SpO2: 98%    Filed Weights   12/09/23 0843  Weight: 196 lb 3.2 oz (89 kg)      GENERAL:alert, no distress and comfortable SKIN: skin color, texture, turgor are normal, no rashes or significant lesions Chest: CTA bilaterally Breast mass: No palpable breast mass or regional adenopathy No LE edema  LABORATORY DATA:  I have reviewed the data as listed Lab Results  Component Value Date   WBC 7.2 12/09/2023   HGB 11.2 (L) 12/09/2023    HCT 34.1 (L) 12/09/2023   MCV 86.5 12/09/2023   PLT 330 12/09/2023   Lab Results  Component Value Date   NA 141 12/09/2023   K 3.8 12/09/2023   CL 108 12/09/2023   CO2 26 12/09/2023    RADIOGRAPHIC STUDIES: I have personally reviewed the radiological reports and agreed with the findings in the report.  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 On Neoadj TCHP. Baseline ECHO with EF of 65-70%. Assessment and Plan Assessment & Plan Chemotherapy treatment schedule Her last chemotherapy treatment is scheduled for May 23rd, but due to [employer] insurance coverage limitations, which only cover one treatment per month, she requests to reschedule to June 2nd. - Reschedule last chemotherapy treatment to June 2nd to align with insurance coverage. - No change in dose needed, she is tolerating this very well.  Fatigue due to chemotherapy She experiences significant fatigue following neulasta , a common side effect of the chemotherapy regimen,   Nausea Nausea is managed with Compazine  post-treatment, with no new or worsening symptoms reported.  Diarrhea Diarrhea has improved without medication intervention. Previously experienced urgency and explosive episodes, but frequency and severity have decreased. Stress may have contributed to previous episodes.  FU with me before next cycle. Inbasket sent to Dr Eli Grizzle for care coordination.   All questions were answered. The patient knows to call the clinic with any problems, questions or concerns.    Martha Arms, MD 12/09/23

## 2023-12-08 NOTE — Assessment & Plan Note (Addendum)
 Invasive Ductal Carcinoma, Left Breast Newly diagnosed, HER2 positive, estrogen receptor positive, with a primary mass measuring 2.1 cm and a satellite nodule of 4 mm. On Neoadj TCHP. Baseline ECHO with EF of 65-70%. Assessment and Plan Assessment & Plan Chemotherapy treatment schedule Her last chemotherapy treatment is scheduled for May 23rd, but due to [employer] insurance coverage limitations, which only cover one treatment per month, she requests to reschedule to June 2nd. - Reschedule last chemotherapy treatment to June 2nd to align with insurance coverage. - No change in dose needed, she is tolerating this very well.  Fatigue due to chemotherapy She experiences significant fatigue following neulasta , a common side effect of the chemotherapy regimen,   Nausea Nausea is managed with Compazine  post-treatment, with no new or worsening symptoms reported.  Diarrhea Diarrhea has improved without medication intervention. Previously experienced urgency and explosive episodes, but frequency and severity have decreased. Stress may have contributed to previous episodes.  FU with me before next cycle. Inbasket sent to Dr Eli Grizzle for care coordination.

## 2023-12-09 ENCOUNTER — Inpatient Hospital Stay: Payer: 59

## 2023-12-09 ENCOUNTER — Encounter: Payer: Self-pay | Admitting: Hematology and Oncology

## 2023-12-09 ENCOUNTER — Other Ambulatory Visit: Payer: Self-pay | Admitting: *Deleted

## 2023-12-09 ENCOUNTER — Inpatient Hospital Stay: Payer: 59 | Attending: Hematology and Oncology

## 2023-12-09 ENCOUNTER — Inpatient Hospital Stay (HOSPITAL_BASED_OUTPATIENT_CLINIC_OR_DEPARTMENT_OTHER): Payer: 59 | Admitting: Hematology and Oncology

## 2023-12-09 VITALS — BP 119/64 | HR 67 | Temp 97.6°F | Resp 16 | Ht 64.0 in | Wt 196.2 lb

## 2023-12-09 DIAGNOSIS — C50412 Malignant neoplasm of upper-outer quadrant of left female breast: Secondary | ICD-10-CM | POA: Diagnosis present

## 2023-12-09 DIAGNOSIS — Z5111 Encounter for antineoplastic chemotherapy: Secondary | ICD-10-CM | POA: Insufficient documentation

## 2023-12-09 DIAGNOSIS — Z5112 Encounter for antineoplastic immunotherapy: Secondary | ICD-10-CM | POA: Diagnosis not present

## 2023-12-09 DIAGNOSIS — R001 Bradycardia, unspecified: Secondary | ICD-10-CM | POA: Diagnosis not present

## 2023-12-09 DIAGNOSIS — I4891 Unspecified atrial fibrillation: Secondary | ICD-10-CM | POA: Insufficient documentation

## 2023-12-09 DIAGNOSIS — Z809 Family history of malignant neoplasm, unspecified: Secondary | ICD-10-CM | POA: Insufficient documentation

## 2023-12-09 DIAGNOSIS — Z7952 Long term (current) use of systemic steroids: Secondary | ICD-10-CM | POA: Diagnosis not present

## 2023-12-09 DIAGNOSIS — E7800 Pure hypercholesterolemia, unspecified: Secondary | ICD-10-CM | POA: Diagnosis not present

## 2023-12-09 DIAGNOSIS — Z170 Estrogen receptor positive status [ER+]: Secondary | ICD-10-CM | POA: Diagnosis not present

## 2023-12-09 DIAGNOSIS — R197 Diarrhea, unspecified: Secondary | ICD-10-CM | POA: Insufficient documentation

## 2023-12-09 DIAGNOSIS — R110 Nausea: Secondary | ICD-10-CM | POA: Diagnosis not present

## 2023-12-09 DIAGNOSIS — R5383 Other fatigue: Secondary | ICD-10-CM | POA: Insufficient documentation

## 2023-12-09 DIAGNOSIS — K219 Gastro-esophageal reflux disease without esophagitis: Secondary | ICD-10-CM | POA: Diagnosis not present

## 2023-12-09 DIAGNOSIS — Z79899 Other long term (current) drug therapy: Secondary | ICD-10-CM | POA: Diagnosis not present

## 2023-12-09 DIAGNOSIS — N920 Excessive and frequent menstruation with regular cycle: Secondary | ICD-10-CM | POA: Diagnosis not present

## 2023-12-09 DIAGNOSIS — E860 Dehydration: Secondary | ICD-10-CM

## 2023-12-09 LAB — CMP (CANCER CENTER ONLY)
ALT: 14 U/L (ref 0–44)
AST: 13 U/L — ABNORMAL LOW (ref 15–41)
Albumin: 4 g/dL (ref 3.5–5.0)
Alkaline Phosphatase: 77 U/L (ref 38–126)
Anion gap: 7 (ref 5–15)
BUN: 17 mg/dL (ref 6–20)
CO2: 26 mmol/L (ref 22–32)
Calcium: 9.4 mg/dL (ref 8.9–10.3)
Chloride: 108 mmol/L (ref 98–111)
Creatinine: 0.56 mg/dL (ref 0.44–1.00)
GFR, Estimated: >60 mL/min (ref >60)
Glucose, Bld: 108 mg/dL — ABNORMAL HIGH (ref 70–99)
Potassium: 3.8 mmol/L (ref 3.5–5.1)
Sodium: 141 mmol/L (ref 135–145)
Total Bilirubin: 0.4 mg/dL (ref 0.0–1.2)
Total Protein: 6.5 g/dL (ref 6.5–8.1)

## 2023-12-09 LAB — CBC WITH DIFFERENTIAL (CANCER CENTER ONLY)
Abs Immature Granulocytes: 0.03 10*3/uL (ref 0.00–0.07)
Basophils Absolute: 0 10*3/uL (ref 0.0–0.1)
Basophils Relative: 0 %
Eosinophils Absolute: 0 10*3/uL (ref 0.0–0.5)
Eosinophils Relative: 0 %
HCT: 34.1 % — ABNORMAL LOW (ref 36.0–46.0)
Hemoglobin: 11.2 g/dL — ABNORMAL LOW (ref 12.0–15.0)
Immature Granulocytes: 0 %
Lymphocytes Relative: 12 %
Lymphs Abs: 0.8 10*3/uL (ref 0.7–4.0)
MCH: 28.4 pg (ref 26.0–34.0)
MCHC: 32.8 g/dL (ref 30.0–36.0)
MCV: 86.5 fL (ref 80.0–100.0)
Monocytes Absolute: 0.3 10*3/uL (ref 0.1–1.0)
Monocytes Relative: 5 %
Neutro Abs: 6 10*3/uL (ref 1.7–7.7)
Neutrophils Relative %: 83 %
Platelet Count: 330 10*3/uL (ref 150–400)
RBC: 3.94 MIL/uL (ref 3.87–5.11)
RDW: 15.6 % — ABNORMAL HIGH (ref 11.5–15.5)
WBC Count: 7.2 10*3/uL (ref 4.0–10.5)
nRBC: 0 % (ref 0.0–0.2)

## 2023-12-09 MED ORDER — ACETAMINOPHEN 325 MG PO TABS
650.0000 mg | ORAL_TABLET | Freq: Once | ORAL | Status: AC
  Administered 2023-12-09: 650 mg via ORAL
  Filled 2023-12-09: qty 2

## 2023-12-09 MED ORDER — SODIUM CHLORIDE 0.9 % IV SOLN
150.0000 mg | Freq: Once | INTRAVENOUS | Status: AC
  Administered 2023-12-09: 150 mg via INTRAVENOUS
  Filled 2023-12-09: qty 150

## 2023-12-09 MED ORDER — HEPARIN SOD (PORK) LOCK FLUSH 100 UNIT/ML IV SOLN
500.0000 [IU] | Freq: Once | INTRAVENOUS | Status: AC | PRN
  Administered 2023-12-09: 500 [IU]

## 2023-12-09 MED ORDER — SODIUM CHLORIDE 0.9 % IV SOLN
10.0000 mg | Freq: Once | INTRAVENOUS | Status: AC
  Administered 2023-12-09: 10 mg via INTRAVENOUS
  Filled 2023-12-09: qty 10

## 2023-12-09 MED ORDER — SODIUM CHLORIDE 0.9 % IV SOLN
60.0000 mg/m2 | Freq: Once | INTRAVENOUS | Status: AC
  Administered 2023-12-09: 119 mg via INTRAVENOUS
  Filled 2023-12-09: qty 11.9

## 2023-12-09 MED ORDER — SODIUM CHLORIDE 0.9 % IV SOLN: INTRAVENOUS | Status: DC

## 2023-12-09 MED ORDER — TRASTUZUMAB-ANNS CHEMO 150 MG IV SOLR
6.0000 mg/kg | Freq: Once | INTRAVENOUS | Status: AC
  Administered 2023-12-09: 525 mg via INTRAVENOUS
  Filled 2023-12-09: qty 25

## 2023-12-09 MED ORDER — SODIUM CHLORIDE 0.9% FLUSH
10.0000 mL | Freq: Once | INTRAVENOUS | Status: AC | PRN
  Administered 2023-12-09: 10 mL

## 2023-12-09 MED ORDER — SODIUM CHLORIDE 0.9 % IV SOLN
442.8000 mg | Freq: Once | INTRAVENOUS | Status: AC
  Administered 2023-12-09: 440 mg via INTRAVENOUS
  Filled 2023-12-09: qty 44

## 2023-12-09 MED ORDER — DIPHENHYDRAMINE HCL 25 MG PO CAPS
50.0000 mg | ORAL_CAPSULE | Freq: Once | ORAL | Status: AC
  Administered 2023-12-09: 50 mg via ORAL
  Filled 2023-12-09: qty 2

## 2023-12-09 MED ORDER — PALONOSETRON HCL INJECTION 0.25 MG/5ML
0.2500 mg | Freq: Once | INTRAVENOUS | Status: AC
  Administered 2023-12-09: 0.25 mg via INTRAVENOUS
  Filled 2023-12-09: qty 5

## 2023-12-09 MED ORDER — SODIUM CHLORIDE 0.9 % IV SOLN
420.0000 mg | Freq: Once | INTRAVENOUS | Status: AC
  Administered 2023-12-09: 420 mg via INTRAVENOUS
  Filled 2023-12-09: qty 14

## 2023-12-09 MED ORDER — SODIUM CHLORIDE 0.9% FLUSH
10.0000 mL | INTRAVENOUS | Status: DC | PRN
  Administered 2023-12-09: 10 mL

## 2023-12-09 NOTE — Patient Instructions (Signed)
 [facility] - A DEPT OF [facility]  Discharge Instructions: Thank you for choosing [facility] to provide your oncology and hematology care.   If you have a lab appointment with the Cancer Center, please go directly to the Cancer Center and check in at the registration area.   Wear comfortable clothing and clothing appropriate for easy access to any Portacath or PICC line.   We strive to give you quality time with your provider. You may need to reschedule your appointment if you arrive late (15 or more minutes).  Arriving late affects you and other patients whose appointments are after yours.  Also, if you miss three or more appointments without notifying the office, you may be dismissed from the clinic at the provider's discretion.      For prescription refill requests, have your pharmacy contact our office and allow 72 hours for refills to be completed.    Today you received the following chemotherapy and/or immunotherapy agents: Kanjinti/Perjeta/Docetaxel/Carboplatin      To help prevent nausea and vomiting after your treatment, we encourage you to take your nausea medication as directed.  BELOW ARE SYMPTOMS THAT SHOULD BE REPORTED IMMEDIATELY: *FEVER GREATER THAN 100.4 F (38 C) OR HIGHER *CHILLS OR SWEATING *NAUSEA AND VOMITING THAT IS NOT CONTROLLED WITH YOUR NAUSEA MEDICATION *UNUSUAL SHORTNESS OF BREATH *UNUSUAL BRUISING OR BLEEDING *URINARY PROBLEMS (pain or burning when urinating, or frequent urination) *BOWEL PROBLEMS (unusual diarrhea, constipation, pain near the anus) TENDERNESS IN MOUTH AND THROAT WITH OR WITHOUT PRESENCE OF ULCERS (sore throat, sores in mouth, or a toothache) UNUSUAL RASH, SWELLING OR PAIN  UNUSUAL VAGINAL DISCHARGE OR ITCHING   Items with * indicate a potential emergency and should be followed up as soon as possible or go to the Emergency Department if any problems should occur.  Please show the CHEMOTHERAPY  ALERT CARD or IMMUNOTHERAPY ALERT CARD at check-in to the Emergency Department and triage nurse.  Should you have questions after your visit or need to cancel or reschedule your appointment, please contact [facility] - A DEPT OF [facility]  Dept: [phone]  and follow the prompts.  Office hours are 8:00 a.m. to 4:30 p.m. Monday - Friday. Please note that voicemails left after 4:00 p.m. may not be returned until the following business day.  We are closed weekends and major holidays. You have access to a nurse at all times for urgent questions. Please call the main number to the clinic Dept: [phone] and follow the prompts.   For any non-urgent questions, you may also contact your provider using MyChart. We now offer e-Visits for anyone 50 and older to request care online for non-urgent symptoms. For details visit [email or web address].   Also download the MyChart app! Go to the app store, search "MyChart", open the app, select [facility], and log in with your MyChart username and password.

## 2023-12-10 ENCOUNTER — Other Ambulatory Visit: Payer: Self-pay

## 2023-12-12 ENCOUNTER — Telehealth: Payer: Self-pay | Admitting: Hematology and Oncology

## 2023-12-12 ENCOUNTER — Inpatient Hospital Stay: Payer: 59

## 2023-12-12 NOTE — Telephone Encounter (Signed)
 Patient called to reschedule injection appointment. The patient is aware of the changes made.

## 2023-12-13 ENCOUNTER — Encounter: Payer: Self-pay | Admitting: *Deleted

## 2023-12-13 ENCOUNTER — Inpatient Hospital Stay

## 2023-12-13 VITALS — BP 109/68 | HR 76 | Temp 98.6°F | Resp 18

## 2023-12-13 DIAGNOSIS — C50412 Malignant neoplasm of upper-outer quadrant of left female breast: Secondary | ICD-10-CM | POA: Diagnosis not present

## 2023-12-13 DIAGNOSIS — Z170 Estrogen receptor positive status [ER+]: Secondary | ICD-10-CM

## 2023-12-13 DIAGNOSIS — E860 Dehydration: Secondary | ICD-10-CM

## 2023-12-13 MED ORDER — PEGFILGRASTIM-CBQV 6 MG/0.6ML ~~LOC~~ SOSY
6.0000 mg | PREFILLED_SYRINGE | Freq: Once | SUBCUTANEOUS | Status: AC
  Administered 2023-12-13: 6 mg via SUBCUTANEOUS
  Filled 2023-12-13: qty 0.6

## 2023-12-16 ENCOUNTER — Inpatient Hospital Stay: Payer: 59

## 2023-12-16 VITALS — BP 116/69 | HR 82 | Resp 16

## 2023-12-16 DIAGNOSIS — C50412 Malignant neoplasm of upper-outer quadrant of left female breast: Secondary | ICD-10-CM | POA: Diagnosis not present

## 2023-12-16 DIAGNOSIS — E860 Dehydration: Secondary | ICD-10-CM

## 2023-12-16 MED ORDER — HEPARIN SOD (PORK) LOCK FLUSH 100 UNIT/ML IV SOLN
500.0000 [IU] | Freq: Once | INTRAVENOUS | Status: AC | PRN
  Administered 2023-12-16: 500 [IU]

## 2023-12-16 MED ORDER — SODIUM CHLORIDE 0.9% FLUSH
10.0000 mL | Freq: Once | INTRAVENOUS | Status: AC | PRN
  Administered 2023-12-16: 10 mL

## 2023-12-16 MED ORDER — SODIUM CHLORIDE 0.9 % IV SOLN: Freq: Once | INTRAVENOUS | Status: AC

## 2023-12-16 NOTE — Patient Instructions (Signed)

## 2023-12-19 ENCOUNTER — Encounter: Payer: Self-pay | Admitting: Hematology and Oncology

## 2023-12-21 ENCOUNTER — Encounter: Payer: Self-pay | Admitting: Hematology and Oncology

## 2023-12-30 ENCOUNTER — Ambulatory Visit: Payer: 59

## 2023-12-30 ENCOUNTER — Other Ambulatory Visit: Payer: 59

## 2023-12-30 ENCOUNTER — Ambulatory Visit: Payer: 59 | Admitting: Hematology and Oncology

## 2023-12-30 ENCOUNTER — Encounter: Payer: Self-pay | Admitting: Hematology and Oncology

## 2024-01-03 ENCOUNTER — Ambulatory Visit

## 2024-01-06 ENCOUNTER — Ambulatory Visit: Payer: 59

## 2024-01-09 ENCOUNTER — Telehealth: Payer: Self-pay

## 2024-01-09 NOTE — Telephone Encounter (Signed)
 Verbally confirmed appts for 6/3

## 2024-01-10 ENCOUNTER — Inpatient Hospital Stay: Attending: Hematology and Oncology

## 2024-01-10 ENCOUNTER — Encounter: Payer: Self-pay | Admitting: Hematology and Oncology

## 2024-01-10 ENCOUNTER — Inpatient Hospital Stay

## 2024-01-10 ENCOUNTER — Inpatient Hospital Stay (HOSPITAL_BASED_OUTPATIENT_CLINIC_OR_DEPARTMENT_OTHER): Admitting: Hematology and Oncology

## 2024-01-10 ENCOUNTER — Other Ambulatory Visit: Payer: Self-pay | Admitting: *Deleted

## 2024-01-10 VITALS — BP 112/73 | HR 68 | Temp 97.9°F | Resp 18 | Wt 189.6 lb

## 2024-01-10 DIAGNOSIS — E7800 Pure hypercholesterolemia, unspecified: Secondary | ICD-10-CM | POA: Diagnosis not present

## 2024-01-10 DIAGNOSIS — Z170 Estrogen receptor positive status [ER+]: Secondary | ICD-10-CM | POA: Insufficient documentation

## 2024-01-10 DIAGNOSIS — N920 Excessive and frequent menstruation with regular cycle: Secondary | ICD-10-CM | POA: Diagnosis not present

## 2024-01-10 DIAGNOSIS — K219 Gastro-esophageal reflux disease without esophagitis: Secondary | ICD-10-CM | POA: Insufficient documentation

## 2024-01-10 DIAGNOSIS — R197 Diarrhea, unspecified: Secondary | ICD-10-CM | POA: Insufficient documentation

## 2024-01-10 DIAGNOSIS — Z808 Family history of malignant neoplasm of other organs or systems: Secondary | ICD-10-CM | POA: Insufficient documentation

## 2024-01-10 DIAGNOSIS — C50412 Malignant neoplasm of upper-outer quadrant of left female breast: Secondary | ICD-10-CM

## 2024-01-10 DIAGNOSIS — R000 Tachycardia, unspecified: Secondary | ICD-10-CM | POA: Diagnosis not present

## 2024-01-10 DIAGNOSIS — E860 Dehydration: Secondary | ICD-10-CM

## 2024-01-10 DIAGNOSIS — I4891 Unspecified atrial fibrillation: Secondary | ICD-10-CM | POA: Diagnosis not present

## 2024-01-10 DIAGNOSIS — Z7952 Long term (current) use of systemic steroids: Secondary | ICD-10-CM | POA: Diagnosis not present

## 2024-01-10 DIAGNOSIS — Z5111 Encounter for antineoplastic chemotherapy: Secondary | ICD-10-CM | POA: Insufficient documentation

## 2024-01-10 DIAGNOSIS — Z5189 Encounter for other specified aftercare: Secondary | ICD-10-CM | POA: Diagnosis not present

## 2024-01-10 DIAGNOSIS — Z79899 Other long term (current) drug therapy: Secondary | ICD-10-CM | POA: Diagnosis not present

## 2024-01-10 LAB — CMP (CANCER CENTER ONLY)
ALT: 16 U/L (ref 0–44)
AST: 14 U/L — ABNORMAL LOW (ref 15–41)
Albumin: 4.2 g/dL (ref 3.5–5.0)
Alkaline Phosphatase: 77 U/L (ref 38–126)
Anion gap: 8 (ref 5–15)
BUN: 14 mg/dL (ref 6–20)
CO2: 26 mmol/L (ref 22–32)
Calcium: 9.5 mg/dL (ref 8.9–10.3)
Chloride: 107 mmol/L (ref 98–111)
Creatinine: 0.57 mg/dL (ref 0.44–1.00)
GFR, Estimated: >60 mL/min (ref >60)
Glucose, Bld: 145 mg/dL — ABNORMAL HIGH (ref 70–99)
Potassium: 3.7 mmol/L (ref 3.5–5.1)
Sodium: 141 mmol/L (ref 135–145)
Total Bilirubin: 0.5 mg/dL (ref 0.0–1.2)
Total Protein: 6.9 g/dL (ref 6.5–8.1)

## 2024-01-10 LAB — CBC WITH DIFFERENTIAL (CANCER CENTER ONLY)
Abs Immature Granulocytes: 0.02 10*3/uL (ref 0.00–0.07)
Basophils Absolute: 0 10*3/uL (ref 0.0–0.1)
Basophils Relative: 0 %
Eosinophils Absolute: 0 10*3/uL (ref 0.0–0.5)
Eosinophils Relative: 0 %
HCT: 36.6 % (ref 36.0–46.0)
Hemoglobin: 12.1 g/dL (ref 12.0–15.0)
Immature Granulocytes: 0 %
Lymphocytes Relative: 21 %
Lymphs Abs: 1 10*3/uL (ref 0.7–4.0)
MCH: 28.3 pg (ref 26.0–34.0)
MCHC: 33.1 g/dL (ref 30.0–36.0)
MCV: 85.5 fL (ref 80.0–100.0)
Monocytes Absolute: 0.3 10*3/uL (ref 0.1–1.0)
Monocytes Relative: 6 %
Neutro Abs: 3.5 10*3/uL (ref 1.7–7.7)
Neutrophils Relative %: 73 %
Platelet Count: 299 10*3/uL (ref 150–400)
RBC: 4.28 MIL/uL (ref 3.87–5.11)
RDW: 14.6 % (ref 11.5–15.5)
WBC Count: 4.9 10*3/uL (ref 4.0–10.5)
nRBC: 0 % (ref 0.0–0.2)

## 2024-01-10 MED ORDER — ACETAMINOPHEN 325 MG PO TABS
650.0000 mg | ORAL_TABLET | Freq: Once | ORAL | Status: AC
  Administered 2024-01-10: 650 mg via ORAL
  Filled 2024-01-10: qty 2

## 2024-01-10 MED ORDER — SODIUM CHLORIDE 0.9 % IV SOLN
60.0000 mg/m2 | Freq: Once | INTRAVENOUS | Status: AC
  Administered 2024-01-10: 119 mg via INTRAVENOUS
  Filled 2024-01-10: qty 11.9

## 2024-01-10 MED ORDER — PALONOSETRON HCL INJECTION 0.25 MG/5ML
0.2500 mg | Freq: Once | INTRAVENOUS | Status: AC
  Administered 2024-01-10: 0.25 mg via INTRAVENOUS
  Filled 2024-01-10: qty 5

## 2024-01-10 MED ORDER — TRASTUZUMAB-ANNS CHEMO 150 MG IV SOLR
6.0000 mg/kg | Freq: Once | INTRAVENOUS | Status: AC
  Administered 2024-01-10: 525 mg via INTRAVENOUS
  Filled 2024-01-10: qty 25

## 2024-01-10 MED ORDER — SODIUM CHLORIDE 0.9% FLUSH
10.0000 mL | Freq: Once | INTRAVENOUS | Status: AC | PRN
  Administered 2024-01-10: 10 mL

## 2024-01-10 MED ORDER — SODIUM CHLORIDE 0.9% FLUSH
10.0000 mL | INTRAVENOUS | Status: DC | PRN
  Administered 2024-01-10: 10 mL

## 2024-01-10 MED ORDER — HEPARIN SOD (PORK) LOCK FLUSH 100 UNIT/ML IV SOLN
500.0000 [IU] | Freq: Once | INTRAVENOUS | Status: AC | PRN
  Administered 2024-01-10: 500 [IU]

## 2024-01-10 MED ORDER — PROCHLORPERAZINE MALEATE 10 MG PO TABS
10.0000 mg | ORAL_TABLET | Freq: Four times a day (QID) | ORAL | 1 refills | Status: DC | PRN
Start: 2024-01-10 — End: 2024-03-19

## 2024-01-10 MED ORDER — SODIUM CHLORIDE 0.9 % IV SOLN
442.8000 mg | Freq: Once | INTRAVENOUS | Status: AC
  Administered 2024-01-10: 440 mg via INTRAVENOUS
  Filled 2024-01-10: qty 44

## 2024-01-10 MED ORDER — SODIUM CHLORIDE 0.9 % IV SOLN
10.0000 mg | Freq: Once | INTRAVENOUS | Status: AC
  Administered 2024-01-10: 10 mg via INTRAVENOUS
  Filled 2024-01-10: qty 10

## 2024-01-10 MED ORDER — SODIUM CHLORIDE 0.9 % IV SOLN
420.0000 mg | Freq: Once | INTRAVENOUS | Status: AC
  Administered 2024-01-10: 420 mg via INTRAVENOUS
  Filled 2024-01-10: qty 14

## 2024-01-10 MED ORDER — ONDANSETRON HCL 8 MG PO TABS: 8.0000 mg | ORAL_TABLET | Freq: Three times a day (TID) | ORAL | 1 refills | Status: DC | PRN

## 2024-01-10 MED ORDER — SODIUM CHLORIDE 0.9 % IV SOLN
150.0000 mg | Freq: Once | INTRAVENOUS | Status: AC
  Administered 2024-01-10: 150 mg via INTRAVENOUS
  Filled 2024-01-10: qty 150

## 2024-01-10 MED ORDER — SODIUM CHLORIDE 0.9 % IV SOLN: INTRAVENOUS | Status: DC

## 2024-01-10 MED ORDER — DIPHENHYDRAMINE HCL 25 MG PO CAPS
50.0000 mg | ORAL_CAPSULE | Freq: Once | ORAL | Status: AC
  Administered 2024-01-10: 50 mg via ORAL
  Filled 2024-01-10: qty 2
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
## 2024-01-10 NOTE — Patient Instructions (Signed)
 [facility] - A DEPT OF [facility]  Discharge Instructions: Thank you for choosing [facility] to provide your oncology and hematology care.   If you have a lab appointment with the Cancer Center, please go directly to the Cancer Center and check in at the registration area.   Wear comfortable clothing and clothing appropriate for easy access to any Portacath or PICC line.   We strive to give you quality time with your provider. You may need to reschedule your appointment if you arrive late (15 or more minutes).  Arriving late affects you and other patients whose appointments are after yours.  Also, if you miss three or more appointments without notifying the office, you may be dismissed from the clinic at the provider's discretion.      For prescription refill requests, have your pharmacy contact our office and allow 72 hours for refills to be completed.    Today you received the following chemotherapy and/or immunotherapy agents: Kanjinti/Perjeta/Docetaxel/Carboplatin      To help prevent nausea and vomiting after your treatment, we encourage you to take your nausea medication as directed.  BELOW ARE SYMPTOMS THAT SHOULD BE REPORTED IMMEDIATELY: *FEVER GREATER THAN 100.4 F (38 C) OR HIGHER *CHILLS OR SWEATING *NAUSEA AND VOMITING THAT IS NOT CONTROLLED WITH YOUR NAUSEA MEDICATION *UNUSUAL SHORTNESS OF BREATH *UNUSUAL BRUISING OR BLEEDING *URINARY PROBLEMS (pain or burning when urinating, or frequent urination) *BOWEL PROBLEMS (unusual diarrhea, constipation, pain near the anus) TENDERNESS IN MOUTH AND THROAT WITH OR WITHOUT PRESENCE OF ULCERS (sore throat, sores in mouth, or a toothache) UNUSUAL RASH, SWELLING OR PAIN  UNUSUAL VAGINAL DISCHARGE OR ITCHING   Items with * indicate a potential emergency and should be followed up as soon as possible or go to the Emergency Department if any problems should occur.  Please show the CHEMOTHERAPY  ALERT CARD or IMMUNOTHERAPY ALERT CARD at check-in to the Emergency Department and triage nurse.  Should you have questions after your visit or need to cancel or reschedule your appointment, please contact [facility] - A DEPT OF [facility]  Dept: [phone]  and follow the prompts.  Office hours are 8:00 a.m. to 4:30 p.m. Monday - Friday. Please note that voicemails left after 4:00 p.m. may not be returned until the following business day.  We are closed weekends and major holidays. You have access to a nurse at all times for urgent questions. Please call the main number to the clinic Dept: [phone] and follow the prompts.   For any non-urgent questions, you may also contact your provider using MyChart. We now offer e-Visits for anyone 50 and older to request care online for non-urgent symptoms. For details visit [email or web address].   Also download the MyChart app! Go to the app store, search "MyChart", open the app, select [facility], and log in with your MyChart username and password.

## 2024-01-10 NOTE — Progress Notes (Signed)
 [facility] CONSULT NOTE  Patient Care Team: Errol Heaps, MD as PCP - General (Family Medicine) Jerryl Morin, DO as PCP - Cardiology (Cardiology) Auther Bo, RN as Oncology Nurse Navigator Alane Hsu, RN as Oncology Nurse Navigator Murleen Arms, MD as Consulting Physician (Hematology and Oncology) Dareen Ebbing, MD as Consulting Physician (General Surgery) Colie Dawes, MD as Attending Physician (Radiation Oncology)  CHIEF COMPLAINTS/PURPOSE OF CONSULTATION:  Newly diagnosed breast cancer  HISTORY OF PRESENTING ILLNESS:  Martha Washington 58 y.o. female is here because of recent diagnosis of left breast cancer  I reviewed her records extensively and collaborated the history with the patient.  SUMMARY OF ONCOLOGIC HISTORY: Oncology History  Malignant neoplasm of upper-outer quadrant of left breast in female, estrogen receptor positive (HCC)  08/04/2023 Mammogram   Further evaluation is suggested for calcifications in the left breast.  There is a group of coarse heterogeneous calcifications associated with an asymmetry in the UPPER OUTER QUADRANT at posterior depth spanning approximately 3.2 x 2.2 x 2.9 cm (AP x transverse x CC). There are linear and branching forms within the large group. There is a possible mass at the posterior margin of the calcifications measuring just under 1 cm in size.   Targeted ultrasound is performed, demonstrating an irregular hypoechoic mass with angular margins and associated calcifications at 2 o'clock 9 cm from the nipple measuring approximately 2.1 x 1.6 x 1.6 cm, demonstrating posterior acoustic shadowing and demonstrating internal power Doppler flow. There is a 0.4 x 0.3 x 0.2 cm satellite mass at 2 o'clock 7 cm from the nipple which is approximately 2 cm removed from the dominant mass.     08/16/2023 Pathology Results     08/22/2023 Initial Diagnosis   Malignant neoplasm of upper-outer quadrant of left breast  in female, estrogen receptor positive (HCC)   08/24/2023 Cancer Staging   Staging form: Breast, AJCC 8th Edition - Clinical stage from 08/24/2023: Stage IB (cT2, cN0, cM0, G2, ER+, PR+, HER2+) - Signed by Murleen Arms, MD on 08/24/2023 Stage prefix: Initial diagnosis Histologic grading system: 3 grade system Laterality: Left Staged by: Pathologist and managing physician Stage used in treatment planning: Yes National guidelines used in treatment planning: Yes Type of national guideline used in treatment planning: NCCN   08/31/2023 Genetic Testing   Negative [facility] CancerNext+RNAinsight Panel.  Report date is 08/31/2023.   The [facility] CancerNext+RNAinsight Panel includes sequencing, rearrangement analysis, and RNA analysis for the following 39 genes: APC, ATM, BAP1, BARD1, BMPR1A, BRCA1, BRCA2, BRIP1, CDH1, CDKN2A, CHEK2, FH, FLCN, MET, MLH1, MSH2, MSH6, MUTYH, NF1, NTHL1, PALB2, PMS2, PTEN, RAD51C, RAD51D, SMAD4, STK11, TP53, TSC1, TSC2, and VHL (sequencing and deletion/duplication); AXIN2, HOXB13, MBD4, MSH3, POLD1 and POLE (sequencing only); EPCAM and GREM1 (deletion/duplication only).    09/16/2023 -  Chemotherapy   Patient is on Treatment Plan : BREAST  Docetaxel  + Carboplatin  + Trastuzumab  + Pertuzumab   (TCHP) q21d       Discussed the use of AI scribe software for clinical note transcription with the patient, who gave verbal consent to proceed. History of Present Illness   Martha Washington is a 58 year old female with breast cancer who presents for her last chemotherapy session.  She has been feeling well over the past few weeks and extended the chemotherapy session by two weeks to align it with June. She has not experienced any severe side effects from the last chemotherapy session, describing them as consistent with previous experiences. An MRI is scheduled  for June 8th, followed by a surgical consultation on June 16th.  She experiences frequent liquid bowel movements, approximately 5 a  day or more and describes them as resembling the food she consumes, such as blueberries. She takes Imodium inconsistently, as she dislikes it, and avoids taking it after her morning bowel movement. Despite this, she reports no other symptoms.  Her family history includes a brother with multiple myeloma undergoing chemotherapy and stem cell transplant, and an uncle with lung cancer in poor health.  Rest of the pertinent 10 point ROS reviewed and neg.  MEDICAL HISTORY:  Past Medical History:  Diagnosis Date   Atrial fibrillation with RVR (HCC)    GERD (gastroesophageal reflux disease)    no meds   High cholesterol    Menorrhagia 07/13/2013   Missed abortion    resolved - no surgery required   New onset atrial fibrillation (HCC) 08/04/2017   with RVR to the 160s; S/P hammertoe OR/notes 08/04/2017   S/P bunionectomy 08/04/2017   SVD (spontaneous vaginal delivery) X 2   Tachycardia 08/04/2017    SURGICAL HISTORY: Past Surgical History:  Procedure Laterality Date   ABDOMINAL HYSTERECTOMY     BACK SURGERY     BREAST BIOPSY Left 08/16/2023   US  LT BREAST BX W LOC DEV 1ST LESION IMG BX SPEC US  GUIDE 08/16/2023 [facility]   BREAST BIOPSY Left 09/12/2023   US  LT BREAST BX W LOC DEV 1ST LESION IMG BX SPEC US  GUIDE 09/12/2023 [facility]MO[facility]   BUNIONECTOMY WITH HAMMERTOE RECONSTRUCTION Right 03/2017   BUNIONECTOMY WITH HAMMERTOE RECONSTRUCTION Left 08/04/2017   BUNIONECTOMY WITH HAMMERTOE RECONSTRUCTION Left 08/04/2017   Procedure: Left modified McBride and Lapidus bunion corrections; Left 2-3 Weil and hammertoe corrections;  Surgeon: Amada Backer, MD;  Location: [facility];  Service: Orthopedics;  Laterality: Left;   PORTACATH PLACEMENT Right 09/15/2023   Procedure: INSERTION PORT-A-CATH WITH ULTASOUND GUIDANCE;  Surgeon: Dareen Ebbing, MD;  Location: [facility];  Service: General;  Laterality: Right;  Leave port accessed   ROBOTIC ASSISTED TOTAL HYSTERECTOMY  WITH BILATERAL SALPINGO OOPHERECTOMY N/A 07/13/2013   Procedure: ROBOTIC ASSISTED TOTAL HYSTERECTOMY WITH BILATERAL SALPINGECTOMY;  Surgeon: Mckinley Spells, MD;  Location: [facility];  Service: Gynecology;  Laterality: N/A;   SHOULDER ARTHROSCOPY W/ ROTATOR CUFF REPAIR Right 2010   SHOULDER ARTHROSCOPY WITH ROTATOR CUFF REPAIR Right 10/17/2014   Procedure: SHOULDER ARTHROSCOPY WITH ROTATOR CUFF REPAIR;  Surgeon: Sandie Cross, MD;  Location: [facility];  Service: Orthopedics;  Laterality: Right;   TUBAL LIGATION     WEIL OSTEOTOMY Left 08/04/2017   Procedure: Left 2nd and 3rd weil osteotomy;  Surgeon: Amada Backer, MD;  Location: [facility];  Service: Orthopedics;  Laterality: Left;   WISDOM TOOTH EXTRACTION      SOCIAL HISTORY: Social History   Socioeconomic History   Marital status: Married    Spouse name: Lavonia Powers   Number of children: Not on file   Years of education: Not on file   Highest education level: Not on file  Occupational History   Not on file  Tobacco Use   Smoking status: Never   Smokeless tobacco: Never  Vaping Use   Vaping status: Never Used  Substance and Sexual Activity   Alcohol use: No   Drug use: No   Sexual activity: Not on file  Other Topics Concern   Not on file  Social History Narrative   Lives with husband   Right Handed  Drinks 1 cup caffeine daily   Social Drivers of Corporate investment banker Strain: Not on file  Food Insecurity: Not on file  Transportation Needs: Not on file  Physical Activity: Not on file  Stress: Not on file  Social Connections: Unknown (12/22/2021)   Received from [facility], [facility]   Social Network    Social Network: Not on file  Intimate Partner Violence: Unknown (11/13/2021)   Received from [facility], [facility]   HITS    Physically Hurt: Not on file    Insult or Talk Down To: Not on file    Threaten Physical Harm: Not on file    Scream or Curse: Not on file    FAMILY  HISTORY: Family History  Problem Relation Age of Onset   Hypertension Mother    Hypertension Father    Bone cancer Brother 66       or other primary?    ALLERGIES:  is allergic to azithromycin, morphine and codeine, and oxycodone .  MEDICATIONS:  Current Outpatient Medications  Medication Sig Dispense Refill   Ascorbic Acid (VITAMIN C) 1000 MG tablet Take 1,000 mg by mouth daily.     b complex vitamins capsule Take 1 capsule by mouth daily.     dexamethasone  (DECADRON ) 4 MG tablet Take 2 tabs by mouth 2 times daily starting day before chemo. Then take 2 tabs daily for 2 days starting day after chemo. Take with food. 30 tablet 1   escitalopram  (LEXAPRO ) 10 MG tablet Take 1 tablet (10 mg total) by mouth daily. 90 tablet 0   lidocaine -prilocaine  (EMLA ) cream Apply to affected area once 30 g 3   ondansetron  (ZOFRAN ) 8 MG tablet Take 1 tablet (8 mg total) by mouth every 8 (eight) hours as needed for nausea or vomiting. Start on the third day after chemotherapy. 30 tablet 1   prochlorperazine  (COMPAZINE ) 10 MG tablet Take 1 tablet (10 mg total) by mouth every 6 (six) hours as needed for nausea or vomiting. 30 tablet 1   VITAMIN D PO Take by mouth.     ZINC CITRATE PO Take by mouth.     No current facility-administered medications for this visit.   Facility-Administered Medications Ordered in Other Visits  Medication Dose Route Frequency Provider Last Rate Last Admin   0.9 %  sodium chloride  infusion   Intravenous Continuous Heidee Audi, MD 10 mL/hr at 01/10/24 1018 New Bag at 01/10/24 1018   CARBOplatin  (PARAPLATIN ) 440 mg in sodium chloride  0.9 % 250 mL chemo infusion  440 mg Intravenous Once Rakeisha Nyce, MD       DOCEtaxel  (TAXOTERE ) 119 mg in sodium chloride  0.9 % 250 mL chemo infusion  60 mg/m2 (Order-Specific) Intravenous Once Darianny Momon, MD       fosaprepitant  (EMEND) 150 mg in sodium chloride  0.9 % 145 mL IVPB  150 mg Intravenous Once Nassir Neidert, MD 450 mL/hr at  01/10/24 1051 150 mg at 01/10/24 1051   heparin  lock flush 100 unit/mL  500 Units Intracatheter Once PRN Kyri Dai, MD       pertuzumab  (PERJETA ) 420 mg in sodium chloride  0.9 % 250 mL chemo infusion  420 mg Intravenous Once Lorilei Horan, MD       sodium chloride  flush (NS) 0.9 % injection 10 mL  10 mL Intracatheter PRN Albert Devaul, MD       trastuzumab -anns (KANJINTI ) 525 mg in sodium chloride  0.9 % 250 mL chemo infusion  6 mg/kg (Order-Specific) Intravenous Once Niesha Bame,  Bernis Brisker, MD        REVIEW OF SYSTEMS:   Constitutional: Denies fevers, chills or abnormal night sweats Eyes: Denies blurriness of vision, double vision or watery eyes Ears, nose, mouth, throat, and face: Denies mucositis or sore throat Respiratory: Denies cough, dyspnea or wheezes Cardiovascular: Denies palpitation, chest discomfort or lower extremity swelling Gastrointestinal:  Denies nausea, heartburn or change in bowel habits Skin: Denies abnormal skin rashes Lymphatics: Denies new lymphadenopathy or easy bruising Neurological:Denies numbness, tingling or new weaknesses Behavioral/Psych: Mood is stable, no new changes  Breast: Denies any palpable lumps or discharge All other systems were reviewed with the patient and are negative.  PHYSICAL EXAMINATION: ECOG PERFORMANCE STATUS: 0 - Asymptomatic  Vitals:   01/10/24 0936  BP: 112/73  Pulse: 68  Resp: 18  Temp: 97.9 F (36.6 C)  SpO2: 100%    Filed Weights   01/10/24 0936  Weight: 189 lb 9.6 oz (86 kg)    GENERAL:alert, no distress and comfortable SKIN: skin color, texture, turgor are normal, no rashes or significant lesions Chest: CTA bilaterally Breast mass: No palpable breast mass or regional adenopathy No LE edema  LABORATORY DATA:  I have reviewed the data as listed Lab Results  Component Value Date   WBC 4.9 01/10/2024   HGB 12.1 01/10/2024   HCT 36.6 01/10/2024   MCV 85.5 01/10/2024   PLT 299 01/10/2024   Lab Results   Component Value Date   NA 141 01/10/2024   K 3.7 01/10/2024   CL 107 01/10/2024   CO2 26 01/10/2024    RADIOGRAPHIC STUDIES: I have personally reviewed the radiological reports and agreed with the findings in the report.  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 On Neoadj TCHP. Baseline ECHO with EF of 65-70%.  Assessment and Plan Assessment & Plan Breast cancer Completing last chemotherapy session TCHP with good response and no significant side effects. MRI and surgical consultation scheduled. Plan to wait one month post-chemotherapy for immune reconstitution before surgery. - Complete last chemotherapy session today. - She had complete clinical response. - Order MRI on June 8th. - Attend surgical consultation on June 16th. - Surgical plans per Dr Eli Grizzle  Diarrhea Experiencing diarrhea with more than five liquid bowel movements daily. Taking Imodium inconsistently. Agreed to tolerate current dose for this session. - Continue Imodium as needed, especially after the first loose bowel movement.  Overdue for echocardiogram, no cardiac symptoms, ok to proceed. Will repeat ECHO in the next couple weeks.   All questions were answered. The patient knows to call the clinic with any problems, questions or concerns.    Murleen Arms, MD 01/10/24

## 2024-01-10 NOTE — Assessment & Plan Note (Addendum)
 Invasive Ductal Carcinoma, Left Breast Newly diagnosed, HER2 positive, estrogen receptor positive, with a primary mass measuring 2.1 cm and a satellite nodule of 4 mm. On Neoadj TCHP. Baseline ECHO with EF of 65-70%.  Assessment and Plan Assessment & Plan Breast cancer Completing last chemotherapy session TCHP with good response and no significant side effects. MRI and surgical consultation scheduled. Plan to wait one month post-chemotherapy for immune reconstitution before surgery. - Complete last chemotherapy session today. - She had complete clinical response. - Order MRI on June 8th. - Attend surgical consultation on June 16th. - Surgical plans per Dr Eli Grizzle  Diarrhea Experiencing diarrhea with more than five liquid bowel movements daily. Taking Imodium inconsistently. Agreed to tolerate current dose for this session. - Continue Imodium as needed, especially after the first loose bowel movement.  Overdue for echocardiogram, no cardiac symptoms, ok to proceed. Will repeat ECHO in the next couple weeks.

## 2024-01-11 ENCOUNTER — Other Ambulatory Visit: Payer: Self-pay

## 2024-01-13 ENCOUNTER — Inpatient Hospital Stay

## 2024-01-13 VITALS — BP 103/68 | HR 59 | Temp 98.3°F | Resp 18

## 2024-01-13 DIAGNOSIS — Z170 Estrogen receptor positive status [ER+]: Secondary | ICD-10-CM

## 2024-01-13 DIAGNOSIS — E860 Dehydration: Secondary | ICD-10-CM

## 2024-01-13 DIAGNOSIS — C50412 Malignant neoplasm of upper-outer quadrant of left female breast: Secondary | ICD-10-CM | POA: Diagnosis not present

## 2024-01-13 MED ORDER — PEGFILGRASTIM-CBQV 6 MG/0.6ML ~~LOC~~ SOSY
6.0000 mg | PREFILLED_SYRINGE | Freq: Once | SUBCUTANEOUS | Status: AC
  Administered 2024-01-13: 6 mg via SUBCUTANEOUS
  Filled 2024-01-13: qty 0.6

## 2024-01-15 ENCOUNTER — Ambulatory Visit
Admission: RE | Admit: 2024-01-15 | Discharge: 2024-01-15 | Disposition: A | Discharge status: Home / Self Care | Source: Ambulatory Visit | Attending: Hematology and Oncology | Admitting: Hematology and Oncology

## 2024-01-15 DIAGNOSIS — C50412 Malignant neoplasm of upper-outer quadrant of left female breast: Secondary | ICD-10-CM

## 2024-01-15 MED ORDER — GADOPICLENOL 0.5 MMOL/ML IV SOLN
9.0000 mL | Freq: Once | INTRAVENOUS | Status: AC | PRN
  Administered 2024-01-15: 9 mL via INTRAVENOUS

## 2024-01-17 ENCOUNTER — Inpatient Hospital Stay

## 2024-01-17 ENCOUNTER — Encounter: Payer: Self-pay | Admitting: *Deleted

## 2024-01-17 VITALS — BP 119/71 | HR 80 | Temp 98.4°F | Resp 15

## 2024-01-17 DIAGNOSIS — C50412 Malignant neoplasm of upper-outer quadrant of left female breast: Secondary | ICD-10-CM | POA: Diagnosis not present

## 2024-01-17 DIAGNOSIS — E860 Dehydration: Secondary | ICD-10-CM

## 2024-01-17 MED ORDER — SODIUM CHLORIDE 0.9 % IV SOLN: Freq: Once | INTRAVENOUS | Status: AC

## 2024-01-19 ENCOUNTER — Ambulatory Visit (HOSPITAL_COMMUNITY)
Admission: RE | Admit: 2024-01-19 | Discharge: 2024-01-19 | Disposition: A | Discharge status: Home / Self Care | Source: Ambulatory Visit | Attending: Hematology and Oncology | Admitting: Hematology and Oncology

## 2024-01-19 DIAGNOSIS — Z0189 Encounter for other specified special examinations: Secondary | ICD-10-CM

## 2024-01-19 DIAGNOSIS — Z170 Estrogen receptor positive status [ER+]: Secondary | ICD-10-CM | POA: Diagnosis present

## 2024-01-19 DIAGNOSIS — C50412 Malignant neoplasm of upper-outer quadrant of left female breast: Secondary | ICD-10-CM | POA: Insufficient documentation

## 2024-01-19 LAB — ECHOCARDIOGRAM COMPLETE
Area-P 1/2: 3.77 cm2
Calc EF: 61.3 %
S' Lateral: 3.2 cm
Single Plane A2C EF: 58.7 %
Single Plane A4C EF: 64.5 %

## 2024-01-23 ENCOUNTER — Ambulatory Visit: Payer: Self-pay | Admitting: Surgery

## 2024-01-23 ENCOUNTER — Telehealth: Payer: Self-pay

## 2024-01-23 DIAGNOSIS — C50912 Malignant neoplasm of unspecified site of left female breast: Secondary | ICD-10-CM

## 2024-01-23 NOTE — Telephone Encounter (Signed)
   Pre-operative Risk Assessment    Patient Name: Martha Washington  DOB: 1966-03-11 MRN: [id number]   Date of last office visit: 09/02/23 Lawana Pray, NP Date of next office visit: NONE   Request for Surgical Clearance    Procedure:  LUMPECTOMY  Date of Surgery:  Clearance TBD                                Surgeon:  Dareen Ebbing, MD Surgeon's Group or Practice Name:  [facility] Phone number:  [phone] Fax number:  [phone]  ATTN: Columbus Debar, LPN   Type of Clearance Requested:   - Medical    Type of Anesthesia:  General    Additional requests/questions:    Signed, Collin Deal   01/23/2024, 12:12 PM

## 2024-01-23 NOTE — H&P (Signed)
 Subjective   Chief Complaint: Follow-up (hx of Br Ca, fu after tx completion and MRI)     History of Present Illness: Martha Washington is a 58 y.o. female who is seen today for breast cancer follow-up.  Breast [facility] 08/24/23 [place] [place]   History of Present Illness: Martha Washington is a 58 y.o. female who is seen today as an office consultation at the request of Dr. Arno Bibles for evaluation of Breast Cancer .   This is a 58 year old female who recently underwent screening mammogram.  She was recalled for calcifications in the left upper outer quadrant.  She underwent further examination including diagnostic mammogram and ultrasound.  This revealed a 3.2 x 2.2 x 2.9 cm area of calcifications.  Ultrasound showed an irregular hypoechoic mass located at 2:00, 9 cm from the nipple.  This measured 2.1 x 1.6 x 1.6 cm.  There is a satellite mass approximately 2 cm away located at 2:00, 7 cm from the nipple.  This measures 0.4 x 0.3 x 0.2 cm.  Ultrasound of the axilla was negative.   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  The patient recently completed a course of neoadjuvant chemotherapy.  She underwent imaging that showed no sign of residual cancer.  She presents now to discuss surgical options.  She voiced some concerns about radiation therapy but she does not remember much of her discussion with Dr. Lurena Sally at her initial visit.  She has been involved in some online breast cancer patient forearms.  Her last chemotherapy treatment was 2 weeks ago. Medical History: Past Medical History:  Diagnosis Date   Anxiety    Arthritis    GERD (gastroesophageal reflux disease)    History of cancer     Patient Active Problem List  Diagnosis   Allergic rhinitis   Atrial fibrillation with RVR (CMS/HHS-HCC)   Cervicalgia   Chronic low back pain   Degenerative spondylolisthesis   Depressive disorder   Gastroesophageal reflux disease    High cholesterol   Lumbar radiculopathy    Past Surgical History:  Procedure Laterality Date   HYSTERECTOMY     JOINT REPLACEMENT       Allergies  Allergen Reactions   Opioids - Morphine Analogues Dizziness, Nausea And Vomiting, Other (See Comments), Tinnitus and Vomiting    Made patient pass out  passed out when taking after last surgery   Azithromycin Abdominal Pain and Other (See Comments)    Stomach Cramps   Erythromycin Other (See Comments)    Other reaction(s): Other, stomach upset   Other Nausea And Vomiting    Current Outpatient Medications on File Prior to Visit  Medication Sig Dispense Refill   b complex vitamins capsule Take 1 capsule by mouth once daily     escitalopram  oxalate (LEXAPRO ) 10 MG tablet Take 1 tablet by mouth once daily     omeprazole (PRILOSEC) 40 MG DR capsule TAKE 1 CAPSULE BY MOUTH EVERY MORNING 30MINUTES BEFORE BREAKFAST FOR 30 DAYS for 90 days     ondansetron  (ZOFRAN ) 8 MG tablet Take 8 mg by mouth     prochlorperazine  (COMPAZINE ) 10 MG tablet Take 10 mg by mouth every 6 (six) hours as needed for Nausea or Vomiting     ZEPBOUND  2.5 mg/0.5 mL pen injector Inject 2.5 mg subcutaneously     ZEPBOUND  5 mg/0.5 mL pen injector Inject 5 mg subcutaneously     No current facility-administered medications on file prior to  visit.    Family History  Problem Relation Age of Onset   Hyperlipidemia (Elevated cholesterol) Mother    High blood pressure (Hypertension) Mother    Obesity Father    Coronary Artery Disease (Blocked arteries around heart) Father    High blood pressure (Hypertension) Father    Hyperlipidemia (Elevated cholesterol) Father      Social History   Tobacco Use  Smoking Status Never  Smokeless Tobacco Never     Social History   Socioeconomic History   Marital status: Married  Tobacco Use   Smoking status: Never   Smokeless tobacco: Never  Substance and Sexual Activity   Alcohol use: Not Currently   Drug use: Not  Currently   Social Drivers of Health    Received from [employer]   Social Network  Housing Stability: Unknown (01/23/2024)   Housing Stability Vital Sign    Homeless in the Last Year: No    Objective:    Vitals:   01/23/24 1057  PainSc: 0-No pain  PainLoc: Breast     Physical Exam    Constitutional:  WDWN in NAD, conversant, no obvious deformities; lying in bed comfortably Eyes:  Pupils equal, round; sclera anicteric; moist conjunctiva; no lid lag HENT:  Oral mucosa moist; good dentition  Neck:  No masses palpated, trachea midline; no thyromegaly Lungs:  CTA bilaterally; normal respiratory effort Breasts:  symmetric, no nipple changes; no palpable masses or lymphadenopathy on either side  CV:  Regular rate and rhythm; no murmurs; extremities well-perfused with no edema Abd:  +bowel sounds, soft, non-tender, no palpable organomegaly; no palpable hernias Musc:  Normal gait; no apparent clubbing or cyanosis in extremities Lymphatic:  No palpable cervical or axillary lymphadenopathy Skin:  Warm, dry; no sign of jaundice Psychiatric - alert and oriented x 4; calm mood and affect   Labs, Imaging and Diagnostic Testing:  CLINICAL DATA:  Assess treatment response to neoadjuvant chemotherapy. Patient diagnosed with left breast malignancy on ultrasound-guided core needle biopsy performed on 08/16/2023. She underwent a subsequent biopsy of an adjacent smaller left breast mass on 09/12/2023 with benign concordant results.   EXAM: BILATERAL BREAST MRI WITH AND WITHOUT CONTRAST   TECHNIQUE: Multiplanar, multisequence MR images of both breasts were obtained prior to and following the intravenous administration of 9 ml of Vueway .   Three-dimensional MR images were rendered by post-processing of the original MR data on an independent workstation. The three-dimensional MR images were interpreted, and findings are reported in the following complete MRI report for this study.  Three dimensional images were evaluated at the independent interpreting workstation using the DynaCAD thin client.   COMPARISON:  Prior exams.  Previous breast MRI dated 08/31/2023.   FINDINGS: Breast composition: b. Scattered fibroglandular tissue.   Background parenchymal enhancement: Minimal   Right breast: No mass or abnormal enhancement.   Left breast: There has been near complete resolution of the abnormal enhancement and associated mass in the posterior, upper outer left breast. A 7 mm minimal focus of residual enhancement is seen approximately 1.4 cm anterior to the heart shaped post biopsy marker clip. Mammographically stable, small benign enhancing oval mass in the lateral breast, mid depth, unchanged from the prior MRI. There are no other areas of abnormal left breast enhancement. More pronounced susceptibility artifact from the coil shaped post biopsy marker clip lies anterior and superior to the heart shaped clip.   Lymph nodes: No abnormal appearing lymph nodes.   Ancillary findings:  None.   IMPRESSION: 1.  Marked positive response to neoadjuvant chemotherapy with a near complete resolution of the abnormal enhancement and associated mass in the posterior, upper outer left breast. 2. No new abnormalities.   RECOMMENDATION: 1. Continue treatment as planned for the known left breast malignancy.   BI-RADS CATEGORY  6: Known biopsy-proven malignancy.     Electronically Signed   By: Amanda Jungling M.D.   On: 01/16/2024 13:35    Assessment and Plan:  Diagnoses and all orders for this visit:  Invasive ductal carcinoma of breast, left (CMS/HHS-HCC)    S/p neoadjuvant chemotherapy with complete radiologic response  I had a long discussion with the patient and her husband regarding her treatment options.  She had an excellent response to neoadjuvant chemotherapy.  I tried alleviate her concerns about radiation therapy and the effect on her cardiopulmonary  function.  We discussed the surgical options which include mastectomy versus breast conservation.  We discussed left breast radioactive seed localized lumpectomy with sentinel of node biopsy versus mastectomy with or without reconstruction and sentinel lymph node biopsy.  After a very long discussion, the patient has opted for breast conservation.  We will proceed with scheduling a left breast radioactive seed localized lumpectomy with left axillary deep sentinel lymph node biopsy.The surgical procedure has been discussed with the patient.  Potential risks, benefits, alternative treatments, and expected outcomes have been explained.  All of the patient's questions at this time have been answered.  The likelihood of reaching the patient's treatment goal is good.  The patient understands the proposed surgical procedure and wishes to proceed.   Zack Crager Jannelle Memory, MD   01/23/2024 11:52 AM

## 2024-01-24 ENCOUNTER — Telehealth: Payer: Self-pay | Admitting: *Deleted

## 2024-01-24 NOTE — Telephone Encounter (Signed)
I left a message for the patient to call our office to schedule a tele visit for pre-op 

## 2024-01-24 NOTE — Telephone Encounter (Signed)
   Name: Martha Washington  DOB: 07-04-1966  MRN: [id number]  Primary Cardiologist: Kardie Tobb, DO   Preoperative team, please contact this patient and set up a phone call appointment for further preoperative risk assessment. Please obtain consent and complete medication review. Thank you for your help.  I confirm that guidance regarding antiplatelet and oral anticoagulation therapy has been completed and, if necessary, noted below.  None requested.   I also confirmed the patient resides in the state of [place] . As per [facility] telemedicine laws, the patient must reside in the state in which the provider is licensed.   Morey Ar, NP 01/24/2024, 11:47 AM [facility]

## 2024-01-24 NOTE — Telephone Encounter (Signed)
 Pt calling to schedule a tele visit

## 2024-01-24 NOTE — Telephone Encounter (Signed)
 Pt has been scheduled tele preop appt 01/25/24. Pt procedure is for a lumpectomy. Pt states Dr. Eli Grizzle is waiting on clearance before scheduling procedure.   Med rec and consent are done.

## 2024-01-24 NOTE — Telephone Encounter (Signed)
 Pt has been scheduled tele preop appt 01/25/24. Pt procedure is for a lumpectomy. Pt states Dr. Eli Grizzle is waiting on clearance before scheduling procedure.   Med rec and consent are done.     Patient Consent for Virtual Visit        Martha Washington has provided verbal consent on 01/24/2024 for a virtual visit (video or telephone).   CONSENT FOR VIRTUAL VISIT FOR:  Martha Washington  By participating in this virtual visit I agree to the following:  I hereby voluntarily request, consent and authorize [facility] and its employed or contracted physicians, physician assistants, nurse practitioners or other licensed health care professionals (the Practitioner), to provide me with telemedicine health care services (the "Services) as deemed necessary by the treating Practitioner. I acknowledge and consent to receive the Services by the Practitioner via telemedicine. I understand that the telemedicine visit will involve communicating with the Practitioner through live audiovisual communication technology and the disclosure of certain medical information by electronic transmission. I acknowledge that I have been given the opportunity to request an in-person assessment or other available alternative prior to the telemedicine visit and am voluntarily participating in the telemedicine visit.  I understand that I have the right to withhold or withdraw my consent to the use of telemedicine in the course of my care at any time, without affecting my right to future care or treatment, and that the Practitioner or I may terminate the telemedicine visit at any time. I understand that I have the right to inspect all information obtained and/or recorded in the course of the telemedicine visit and may receive copies of available information for a reasonable fee.  I understand that some of the potential risks of receiving the Services via telemedicine include:  Delay or interruption in medical evaluation due  to technological equipment failure or disruption; Information transmitted may not be sufficient (e.g. poor resolution of images) to allow for appropriate medical decision making by the Practitioner; and/or  In rare instances, security protocols could fail, causing a breach of personal health information.  Furthermore, I acknowledge that it is my responsibility to provide information about my medical history, conditions and care that is complete and accurate to the best of my ability. I acknowledge that Practitioner's advice, recommendations, and/or decision may be based on factors not within their control, such as incomplete or inaccurate data provided by me or distortions of diagnostic images or specimens that may result from electronic transmissions. I understand that the practice of medicine is not an exact science and that Practitioner makes no warranties or guarantees regarding treatment outcomes. I acknowledge that a copy of this consent can be made available to me via my patient portal [facility] MyChart), or I can request a printed copy by calling the office of [facility].    I understand that my insurance will be billed for this visit.   I have read or had this consent read to me. I understand the contents of this consent, which adequately explains the benefits and risks of the Services being provided via telemedicine.  I have been provided ample opportunity to ask questions regarding this consent and the Services and have had my questions answered to my satisfaction. I give my informed consent for the services to be provided through the use of telemedicine in my medical care

## 2024-01-25 ENCOUNTER — Ambulatory Visit: Attending: Cardiology | Admitting: Student

## 2024-01-25 DIAGNOSIS — Z01810 Encounter for preprocedural cardiovascular examination: Secondary | ICD-10-CM | POA: Diagnosis not present

## 2024-01-25 NOTE — Progress Notes (Signed)
 Virtual Visit via Telephone Note   Because of Martha Washington's co-morbid illnesses, she is at least at moderate risk for complications without adequate follow up.  This format is felt to be most appropriate for this patient at this time.  The patient did not have access to video technology/had technical difficulties with video requiring transitioning to audio format only (telephone).  All issues noted in this document were discussed and addressed.  No physical exam could be performed with this format.  Please refer to the patient's chart for her consent to telehealth for [facility]  Evaluation Performed:  Preoperative cardiovascular risk assessment _____________   Date:  01/25/2024   Patient ID:  Martha Washington, DOB October 28, 1965, MRN [id number] Patient Location:  Home Provider location:   Office  Primary Care Provider:  Errol Heaps, MD Primary Cardiologist:  Kardie Tobb, DO  Chief Complaint / Patient Profile   58 y.o. y/o female with a h/o PAF, GERD, breast cancer who is pending lumpectomy by Dr. Eli Grizzle and presents today for telephonic preoperative cardiovascular risk assessment.  History of Present Illness    Martha Washington is a 58 y.o. female who presents via audio/video conferencing for a telehealth visit today.  Pt was last seen in cardiology clinic on 09/02/2023 by Lawana Pray, NP.  At that time Martha Washington was stable from a cardiac standpoint.  The patient is now pending procedure as outlined above. Since her last visit, she  is doing well. Patient denies shortness of breath, dyspnea on exertion, lower extremity edema, orthopnea or PND. No chest pain, pressure, or tightness. No palpitations. She has been walking 30 minutes a day for exercise.   Past Medical History    Past Medical History:  Diagnosis Date   Atrial fibrillation with RVR (HCC)    GERD (gastroesophageal reflux disease)    no meds   High cholesterol    Menorrhagia 07/13/2013   Missed  abortion    resolved - no surgery required   New onset atrial fibrillation (HCC) 08/04/2017   with RVR to the 160s; S/P hammertoe OR/notes 08/04/2017   S/P bunionectomy 08/04/2017   SVD (spontaneous vaginal delivery) X 2   Tachycardia 08/04/2017   Past Surgical History:  Procedure Laterality Date   ABDOMINAL HYSTERECTOMY     BACK SURGERY     BREAST BIOPSY Left 08/16/2023   US  LT BREAST BX W LOC DEV 1ST LESION IMG BX SPEC US  GUIDE 08/16/2023 [facility]   BREAST BIOPSY Left 09/12/2023   US  LT BREAST BX W LOC DEV 1ST LESION IMG BX SPEC US  GUIDE 09/12/2023 [facility]   BUNIONECTOMY WITH HAMMERTOE RECONSTRUCTION Right 03/2017   BUNIONECTOMY WITH HAMMERTOE RECONSTRUCTION Left 08/04/2017   BUNIONECTOMY WITH HAMMERTOE RECONSTRUCTION Left 08/04/2017   Procedure: Left modified McBride and Lapidus bunion corrections; Left 2-3 Weil and hammertoe corrections;  Surgeon: Amada Backer, MD;  Location: [facility];  Service: Orthopedics;  Laterality: Left;   PORTACATH PLACEMENT Right 09/15/2023   Procedure: INSERTION PORT-A-CATH WITH ULTASOUND GUIDANCE;  Surgeon: Dareen Ebbing, MD;  Location: [facility];  Service: General;  Laterality: Right;  Leave port accessed   ROBOTIC ASSISTED TOTAL HYSTERECTOMY WITH BILATERAL SALPINGO OOPHERECTOMY N/A 07/13/2013   Procedure: ROBOTIC ASSISTED TOTAL HYSTERECTOMY WITH BILATERAL SALPINGECTOMY;  Surgeon: Mckinley Spells, MD;  Location: [facility];  Service: Gynecology;  Laterality: N/A;   SHOULDER ARTHROSCOPY W/ ROTATOR CUFF REPAIR Right 2010   SHOULDER ARTHROSCOPY WITH ROTATOR CUFF REPAIR Right  10/17/2014   Procedure: SHOULDER ARTHROSCOPY WITH ROTATOR CUFF REPAIR;  Surgeon: Sandie Cross, MD;  Location: [facility];  Service: Orthopedics;  Laterality: Right;   TUBAL LIGATION     WEIL OSTEOTOMY Left 08/04/2017   Procedure: Left 2nd and 3rd weil osteotomy;  Surgeon: Amada Backer, MD;  Location: [facility];   Service: Orthopedics;  Laterality: Left;   WISDOM TOOTH EXTRACTION      Allergies  Allergies  Allergen Reactions   Azithromycin     Stomach Cramps   Morphine And Codeine Nausea And Vomiting   Oxycodone      passed out when taking after last surgery    Home Medications    Prior to Admission medications   Medication Sig Start Date End Date Taking? Authorizing Provider  Ascorbic Acid (VITAMIN C) 1000 MG tablet Take 1,000 mg by mouth daily.    [provider]  b complex vitamins capsule Take 1 capsule by mouth daily.    [provider]  dexamethasone  (DECADRON ) 4 MG tablet Take 2 tabs by mouth 2 times daily starting day before chemo. Then take 2 tabs daily for 2 days starting day after chemo. Take with food. 08/31/23   Iruku, Praveena, MD  escitalopram  (LEXAPRO ) 10 MG tablet Take 1 tablet (10 mg total) by mouth daily. 08/21/23     lidocaine -prilocaine  (EMLA ) cream Apply to affected area once 08/31/23   Iruku, Praveena, MD  ondansetron  (ZOFRAN ) 8 MG tablet Take 1 tablet (8 mg total) by mouth every 8 (eight) hours as needed for nausea or vomiting. Start on the third day after chemotherapy. 01/10/24   Iruku, Praveena, MD  prochlorperazine  (COMPAZINE ) 10 MG tablet Take 1 tablet (10 mg total) by mouth every 6 (six) hours as needed for nausea or vomiting. 01/10/24   Iruku, Praveena, MD  VITAMIN D PO Take by mouth.    [provider]  ZINC CITRATE PO Take by mouth.    [provider]    Physical Exam    Vital Signs:  Martha Washington does not have vital signs available for review today.  Given telephonic nature of communication, physical exam is limited. AAOx3. NAD. Normal affect.  Speech and respirations are unlabored.   Assessment & Plan    Primary Cardiologist: Kardie Tobb, DO  Preoperative cardiovascular risk assessment.  Lumpectomy by Dr. Eli Grizzle.  Chart reviewed as part of pre-operative protocol coverage. According to the RCRI, patient has a  0.4-3.9% risk of MACE. Patient reports activity equivalent to 4.0 METS (walking 30 minutes daily).   Given past medical history and time since last visit, based on ACC/AHA guidelines, Martha Washington would be at acceptable risk for the planned procedure without further cardiovascular testing.   Patient was advised that if she develops new symptoms prior to surgery to contact our office to arrange a follow-up appointment.  she verbalized understanding.  I will route this recommendation to the requesting party via Epic fax function.  Please call with questions.  Time:   Today, I have spent 5 minutes with the patient with telehealth technology discussing medical history, symptoms, and management plan.     Morey Ar, NP  01/25/2024, 9:38 AM

## 2024-01-28 ENCOUNTER — Other Ambulatory Visit: Payer: Self-pay

## 2024-02-03 ENCOUNTER — Encounter: Payer: Self-pay | Admitting: *Deleted

## 2024-02-06 ENCOUNTER — Encounter: Payer: Self-pay | Admitting: *Deleted

## 2024-02-06 ENCOUNTER — Other Ambulatory Visit: Payer: Self-pay | Admitting: Surgery

## 2024-02-06 ENCOUNTER — Ambulatory Visit: Payer: Self-pay | Admitting: Surgery

## 2024-02-06 DIAGNOSIS — C50412 Malignant neoplasm of upper-outer quadrant of left female breast: Secondary | ICD-10-CM

## 2024-02-06 DIAGNOSIS — C50912 Malignant neoplasm of unspecified site of left female breast: Secondary | ICD-10-CM

## 2024-02-07 ENCOUNTER — Other Ambulatory Visit: Payer: Self-pay

## 2024-02-07 ENCOUNTER — Encounter: Payer: Self-pay | Admitting: Hematology and Oncology

## 2024-02-08 ENCOUNTER — Other Ambulatory Visit: Payer: Self-pay

## 2024-02-09 ENCOUNTER — Other Ambulatory Visit (HOSPITAL_BASED_OUTPATIENT_CLINIC_OR_DEPARTMENT_OTHER): Payer: Self-pay | Admitting: Family Medicine

## 2024-02-09 ENCOUNTER — Other Ambulatory Visit: Payer: Self-pay

## 2024-02-09 DIAGNOSIS — Z780 Asymptomatic menopausal state: Secondary | ICD-10-CM

## 2024-02-25 ENCOUNTER — Other Ambulatory Visit: Payer: Self-pay

## 2024-02-29 ENCOUNTER — Other Ambulatory Visit: Payer: Self-pay

## 2024-02-29 ENCOUNTER — Encounter (HOSPITAL_BASED_OUTPATIENT_CLINIC_OR_DEPARTMENT_OTHER): Payer: Self-pay | Admitting: Surgery

## 2024-02-29 NOTE — Progress Notes (Signed)
   02/29/24 1603  PAT Phone Screen  Is the patient taking a GLP-1 receptor agonist? No  Do You Have Diabetes? No  Do You Have Hypertension? No  Have You Ever Been to the ER for Asthma? No  Have You Taken Oral Steroids in the Past 3 Months? No  Do you Take Phenteramine or any Other Diet Drugs? No  Recent  Lab Work, EKG, CXR? Yes  Where was this test performed? EKG 09/02/23 [facility]  Do you have a history of heart problems? Yes  Cardiologist Name Dr Sheena, Cardiac clearance 01/25/24 placed on chart  Have you ever had tests on your heart? Yes  What cardiac tests were performed? Echo  What date/year were cardiac tests completed? EF 60-65% 01/19/24  Results viewable: [facility] Media Tab  Any Recent Hospitalizations? No  Height 5' 4 (1.626 m)  Weight 86 kg  Pat Appointment Scheduled No  Reason for No Appointment Not Needed

## 2024-03-02 MED ORDER — CHLORHEXIDINE GLUCONATE CLOTH 2 % EX PADS: 6.0000 | MEDICATED_PAD | Freq: Once | CUTANEOUS | Status: DC

## 2024-03-02 NOTE — Progress Notes (Signed)

## 2024-03-05 ENCOUNTER — Ambulatory Visit (HOSPITAL_BASED_OUTPATIENT_CLINIC_OR_DEPARTMENT_OTHER)
Admission: RE | Admit: 2024-03-05 | Discharge: 2024-03-05 | Disposition: A | Discharge status: Home / Self Care | Source: Ambulatory Visit | Attending: Family Medicine | Admitting: Family Medicine

## 2024-03-05 DIAGNOSIS — Z780 Asymptomatic menopausal state: Secondary | ICD-10-CM | POA: Insufficient documentation

## 2024-03-06 ENCOUNTER — Ambulatory Visit
Admission: RE | Admit: 2024-03-06 | Discharge: 2024-03-06 | Disposition: A | Discharge status: Home / Self Care | Source: Ambulatory Visit | Attending: Surgery | Admitting: Surgery

## 2024-03-06 HISTORY — PX: BREAST BIOPSY: SHX20

## 2024-03-07 ENCOUNTER — Ambulatory Visit (HOSPITAL_COMMUNITY)
Admission: RE | Admit: 2024-03-07 | Discharge: 2024-03-07 | Disposition: A | Discharge status: Home / Self Care | Source: Ambulatory Visit | Attending: Surgery | Admitting: Surgery

## 2024-03-07 ENCOUNTER — Ambulatory Visit (HOSPITAL_BASED_OUTPATIENT_CLINIC_OR_DEPARTMENT_OTHER): Admitting: Anesthesiology

## 2024-03-07 ENCOUNTER — Ambulatory Visit
Admission: RE | Admit: 2024-03-07 | Discharge: 2024-03-07 | Disposition: A | Discharge status: Home / Self Care | Source: Ambulatory Visit | Attending: Surgery | Admitting: Surgery

## 2024-03-07 ENCOUNTER — Encounter (HOSPITAL_BASED_OUTPATIENT_CLINIC_OR_DEPARTMENT_OTHER)
Admission: RE | Disposition: A | Discharge status: Home / Self Care | Payer: Self-pay | Source: Home / Self Care | Attending: Surgery

## 2024-03-07 ENCOUNTER — Other Ambulatory Visit: Payer: Self-pay

## 2024-03-07 ENCOUNTER — Encounter (HOSPITAL_BASED_OUTPATIENT_CLINIC_OR_DEPARTMENT_OTHER): Payer: Self-pay | Admitting: Surgery

## 2024-03-07 ENCOUNTER — Ambulatory Visit (HOSPITAL_BASED_OUTPATIENT_CLINIC_OR_DEPARTMENT_OTHER)
Admission: RE | Admit: 2024-03-07 | Discharge: 2024-03-07 | Disposition: A | Discharge status: Home / Self Care | Attending: Surgery | Admitting: Surgery

## 2024-03-07 DIAGNOSIS — Z170 Estrogen receptor positive status [ER+]: Secondary | ICD-10-CM | POA: Diagnosis not present

## 2024-03-07 DIAGNOSIS — K219 Gastro-esophageal reflux disease without esophagitis: Secondary | ICD-10-CM | POA: Insufficient documentation

## 2024-03-07 DIAGNOSIS — Z1721 Progesterone receptor positive status: Secondary | ICD-10-CM | POA: Diagnosis not present

## 2024-03-07 DIAGNOSIS — C50912 Malignant neoplasm of unspecified site of left female breast: Secondary | ICD-10-CM

## 2024-03-07 DIAGNOSIS — Z9221 Personal history of antineoplastic chemotherapy: Secondary | ICD-10-CM | POA: Diagnosis not present

## 2024-03-07 DIAGNOSIS — C50412 Malignant neoplasm of upper-outer quadrant of left female breast: Secondary | ICD-10-CM | POA: Insufficient documentation

## 2024-03-07 DIAGNOSIS — I4891 Unspecified atrial fibrillation: Secondary | ICD-10-CM | POA: Insufficient documentation

## 2024-03-07 DIAGNOSIS — Z1731 Human epidermal growth factor receptor 2 positive status: Secondary | ICD-10-CM | POA: Diagnosis not present

## 2024-03-07 HISTORY — PX: BREAST LUMPECTOMY WITH RADIOACTIVE SEED LOCALIZATION: SHX6424

## 2024-03-07 HISTORY — PX: AXILLARY SENTINEL NODE BIOPSY: SHX5738

## 2024-03-07 SURGERY — BREAST LUMPECTOMY WITH RADIOACTIVE SEED LOCALIZATION
Anesthesia: Regional | Site: Breast | Laterality: Left

## 2024-03-07 MED ORDER — FENTANYL CITRATE (PF) 100 MCG/2ML IJ SOLN
100.0000 ug | Freq: Once | INTRAMUSCULAR | Status: AC
  Administered 2024-03-07: 100 ug via INTRAVENOUS

## 2024-03-07 MED ORDER — PROPOFOL 10 MG/ML IV BOLUS
INTRAVENOUS | Status: DC | PRN
Start: 2024-03-07 — End: 2024-03-07
  Administered 2024-03-07: 200 mg via INTRAVENOUS

## 2024-03-07 MED ORDER — MIDAZOLAM HCL 5 MG/5ML IJ SOLN
INTRAMUSCULAR | Status: DC | PRN
Start: 2024-03-07 — End: 2024-03-07
  Administered 2024-03-07: 1 mg via INTRAVENOUS

## 2024-03-07 MED ORDER — KETOROLAC TROMETHAMINE 30 MG/ML IJ SOLN
INTRAMUSCULAR | Status: AC
  Filled 2024-03-07: qty 1

## 2024-03-07 MED ORDER — PHENYLEPHRINE 80 MCG/ML (10ML) SYRINGE FOR IV PUSH (FOR BLOOD PRESSURE SUPPORT)
PREFILLED_SYRINGE | INTRAVENOUS | Status: DC | PRN
  Administered 2024-03-07: 160 ug via INTRAVENOUS

## 2024-03-07 MED ORDER — LACTATED RINGERS IV SOLN: INTRAVENOUS | Status: DC | PRN

## 2024-03-07 MED ORDER — LACTATED RINGERS IV SOLN: INTRAVENOUS | Status: DC

## 2024-03-07 MED ORDER — PROPOFOL 500 MG/50ML IV EMUL
INTRAVENOUS | Status: DC | PRN
Start: 2024-03-07 — End: 2024-03-07
  Administered 2024-03-07: 125 ug/kg/min via INTRAVENOUS

## 2024-03-07 MED ORDER — DEXAMETHASONE SODIUM PHOSPHATE 10 MG/ML IJ SOLN
INTRAMUSCULAR | Status: DC | PRN
Start: 2024-03-07 — End: 2024-03-07
  Administered 2024-03-07: 10 mg via INTRAVENOUS

## 2024-03-07 MED ORDER — 0.9 % SODIUM CHLORIDE (POUR BTL) OPTIME
TOPICAL | Status: DC | PRN
  Administered 2024-03-07: 100 mL

## 2024-03-07 MED ORDER — ACETAMINOPHEN 500 MG PO TABS
ORAL_TABLET | ORAL | Status: AC
  Filled 2024-03-07: qty 2

## 2024-03-07 MED ORDER — FENTANYL CITRATE (PF) 100 MCG/2ML IJ SOLN: 25.0000 ug | INTRAMUSCULAR | Status: DC | PRN

## 2024-03-07 MED ORDER — FENTANYL CITRATE (PF) 100 MCG/2ML IJ SOLN
INTRAMUSCULAR | Status: AC
  Filled 2024-03-07: qty 2

## 2024-03-07 MED ORDER — FENTANYL CITRATE (PF) 100 MCG/2ML IJ SOLN
INTRAMUSCULAR | Status: AC
Start: 2024-03-07 — End: 2024-03-07
  Filled 2024-03-07: qty 2

## 2024-03-07 MED ORDER — CEFAZOLIN SODIUM-DEXTROSE 2-4 GM/100ML-% IV SOLN
2.0000 g | INTRAVENOUS | Status: AC
  Administered 2024-03-07: 2 g via INTRAVENOUS

## 2024-03-07 MED ORDER — ROPIVACAINE HCL 5 MG/ML IJ SOLN
INTRAMUSCULAR | Status: DC | PRN
  Administered 2024-03-07: 30 mL via PERINEURAL

## 2024-03-07 MED ORDER — TECHNETIUM TC 99M TILMANOCEPT KIT
1.0000 | PACK | Freq: Once | INTRAVENOUS | Status: AC | PRN
  Administered 2024-03-07: 1 via INTRADERMAL

## 2024-03-07 MED ORDER — LIDOCAINE 2% (20 MG/ML) 5 ML SYRINGE
INTRAMUSCULAR | Status: AC
  Filled 2024-03-07: qty 5

## 2024-03-07 MED ORDER — KETOROLAC TROMETHAMINE 15 MG/ML IJ SOLN
INTRAMUSCULAR | Status: DC | PRN
  Administered 2024-03-07: 15 mg via INTRAVENOUS

## 2024-03-07 MED ORDER — LIDOCAINE 2% (20 MG/ML) 5 ML SYRINGE: INTRAMUSCULAR | Status: DC | PRN

## 2024-03-07 MED ORDER — AMISULPRIDE (ANTIEMETIC) 5 MG/2ML IV SOLN: 10.0000 mg | Freq: Once | INTRAVENOUS | Status: DC | PRN

## 2024-03-07 MED ORDER — TRAMADOL HCL 50 MG PO TABS: 50.0000 mg | ORAL_TABLET | Freq: Four times a day (QID) | ORAL | 0 refills | Status: AC | PRN

## 2024-03-07 MED ORDER — BUPIVACAINE-EPINEPHRINE 0.25% -1:200000 IJ SOLN
INTRAMUSCULAR | Status: DC | PRN
  Administered 2024-03-07: 10 mL

## 2024-03-07 MED ORDER — LIDOCAINE 2% (20 MG/ML) 5 ML SYRINGE
INTRAMUSCULAR | Status: DC | PRN
  Administered 2024-03-07: 20 mg via INTRAVENOUS

## 2024-03-07 MED ORDER — PHENYLEPHRINE 80 MCG/ML (10ML) SYRINGE FOR IV PUSH (FOR BLOOD PRESSURE SUPPORT)
PREFILLED_SYRINGE | INTRAVENOUS | Status: AC
  Filled 2024-03-07: qty 10

## 2024-03-07 MED ORDER — PROPOFOL 500 MG/50ML IV EMUL
INTRAVENOUS | Status: AC
  Filled 2024-03-07: qty 100

## 2024-03-07 MED ORDER — ONDANSETRON HCL 4 MG/2ML IJ SOLN
INTRAMUSCULAR | Status: AC
  Filled 2024-03-07: qty 2

## 2024-03-07 MED ORDER — DEXMEDETOMIDINE HCL IN NACL 80 MCG/20ML IV SOLN
INTRAVENOUS | Status: AC
  Filled 2024-03-07: qty 20

## 2024-03-07 MED ORDER — DEXAMETHASONE SODIUM PHOSPHATE 10 MG/ML IJ SOLN
INTRAMUSCULAR | Status: AC
  Filled 2024-03-07: qty 1

## 2024-03-07 MED ORDER — PHENYLEPHRINE HCL-NACL 20-0.9 MG/250ML-% IV SOLN
INTRAVENOUS | Status: DC | PRN
Start: 2024-03-07 — End: 2024-03-07
  Administered 2024-03-07: 20 ug/min via INTRAVENOUS

## 2024-03-07 MED ORDER — BUPIVACAINE-EPINEPHRINE (PF) 0.25% -1:200000 IJ SOLN
INTRAMUSCULAR | Status: AC
  Filled 2024-03-07: qty 30

## 2024-03-07 MED ORDER — ONDANSETRON HCL 4 MG/2ML IJ SOLN
INTRAMUSCULAR | Status: DC | PRN
  Administered 2024-03-07: 4 mg via INTRAVENOUS

## 2024-03-07 MED ORDER — MIDAZOLAM HCL 2 MG/2ML IJ SOLN
2.0000 mg | Freq: Once | INTRAMUSCULAR | Status: AC
  Administered 2024-03-07: 2 mg via INTRAVENOUS

## 2024-03-07 MED ORDER — ACETAMINOPHEN 500 MG PO TABS
1000.0000 mg | ORAL_TABLET | ORAL | Status: AC
  Administered 2024-03-07: 1000 mg via ORAL

## 2024-03-07 MED ORDER — FENTANYL CITRATE (PF) 100 MCG/2ML IJ SOLN
INTRAMUSCULAR | Status: DC | PRN
  Administered 2024-03-07: 25 ug via INTRAVENOUS
  Administered 2024-03-07: 50 ug via INTRAVENOUS
  Administered 2024-03-07: 25 ug via INTRAVENOUS

## 2024-03-07 MED ORDER — MIDAZOLAM HCL 2 MG/2ML IJ SOLN
INTRAMUSCULAR | Status: AC
Start: 2024-03-07 — End: 2024-03-07
  Filled 2024-03-07: qty 2

## 2024-03-07 MED ORDER — MIDAZOLAM HCL 2 MG/2ML IJ SOLN
INTRAMUSCULAR | Status: AC
  Filled 2024-03-07: qty 2

## 2024-03-07 MED ORDER — ACETAMINOPHEN 500 MG PO TABS: 1000.0000 mg | ORAL_TABLET | Freq: Once | ORAL | Status: DC

## 2024-03-07 SURGICAL SUPPLY — 47 items
BENZOIN TINCTURE PRP APPL 2/3 (GAUZE/BANDAGES/DRESSINGS) ×2 IMPLANT
BLADE CLIPPER SURG (BLADE) IMPLANT
BLADE HEX COATED 2.75 (ELECTRODE) ×2 IMPLANT
BLADE SURG 15 STRL LF DISP TIS (BLADE) ×2 IMPLANT
BNDG COHESIVE 3X5 TAN ST LF (GAUZE/BANDAGES/DRESSINGS) IMPLANT
CANISTER SUC SOCK COL 7IN (MISCELLANEOUS) IMPLANT
CANISTER SUCT 1200ML W/VALVE (MISCELLANEOUS) ×2 IMPLANT
CHLORAPREP W/TINT 26 (MISCELLANEOUS) ×2 IMPLANT
CLIP APPLIE 9.375 MED OPEN (MISCELLANEOUS) ×2 IMPLANT
COVER BACK TABLE 60X90IN (DRAPES) ×2 IMPLANT
COVER MAYO STAND STRL (DRAPES) ×2 IMPLANT
COVER PROBE CYLINDRICAL 5X96 (MISCELLANEOUS) ×2 IMPLANT
DRAPE LAPAROSCOPIC ABDOMINAL (DRAPES) ×2 IMPLANT
DRAPE LAPAROTOMY 100X72 PEDS (DRAPES) ×2 IMPLANT
DRAPE UTILITY XL STRL (DRAPES) ×2 IMPLANT
DRSG TEGADERM 4X4.75 (GAUZE/BANDAGES/DRESSINGS) ×4 IMPLANT
ELECTRODE REM PT RTRN 9FT ADLT (ELECTROSURGICAL) ×2 IMPLANT
GAUZE SPONGE 2X2 STRL 8-PLY (GAUZE/BANDAGES/DRESSINGS) IMPLANT
GAUZE SPONGE 4X4 12PLY STRL LF (GAUZE/BANDAGES/DRESSINGS) ×2 IMPLANT
GLOVE BIO SURGEON STRL SZ7 (GLOVE) ×2 IMPLANT
GLOVE BIOGEL PI IND STRL 7.0 (GLOVE) IMPLANT
GLOVE BIOGEL PI IND STRL 7.5 (GLOVE) ×2 IMPLANT
GLOVE SURG SS PI 7.0 STRL IVOR (GLOVE) IMPLANT
GOWN STRL REUS W/ TWL LRG LVL3 (GOWN DISPOSABLE) ×4 IMPLANT
KIT MARKER MARGIN INK (KITS) ×2 IMPLANT
NDL HYPO 25X1 1.5 SAFETY (NEEDLE) ×4 IMPLANT
NDL SAFETY ECLIPSE 18X1.5 (NEEDLE) ×2 IMPLANT
NEEDLE HYPO 25X1 1.5 SAFETY (NEEDLE) ×2 IMPLANT
NS IRRIG 1000ML POUR BTL (IV SOLUTION) ×2 IMPLANT
PACK BASIN DAY SURGERY FS (CUSTOM PROCEDURE TRAY) ×2 IMPLANT
PENCIL SMOKE EVACUATOR (MISCELLANEOUS) ×2 IMPLANT
SHEET MEDIUM DRAPE 40X70 STRL (DRAPES) IMPLANT
SLEEVE SCD COMPRESS KNEE MED (STOCKING) ×2 IMPLANT
SPIKE FLUID TRANSFER (MISCELLANEOUS) IMPLANT
SPONGE T-LAP 4X18 ~~LOC~~+RFID (SPONGE) ×2 IMPLANT
STOCKINETTE IMPERVIOUS LG (DRAPES) IMPLANT
STRIP CLOSURE SKIN 1/2X4 (GAUZE/BANDAGES/DRESSINGS) ×2 IMPLANT
SUT MON AB 4-0 PC3 18 (SUTURE) ×2 IMPLANT
SUT SILK 2 0 SH (SUTURE) IMPLANT
SUT VIC AB 3-0 SH 27X BRD (SUTURE) ×2 IMPLANT
SYR BULB EAR ULCER 3OZ GRN STR (SYRINGE) IMPLANT
SYR CONTROL 10ML LL (SYRINGE) ×4 IMPLANT
TOWEL GREEN STERILE FF (TOWEL DISPOSABLE) ×2 IMPLANT
TRACER MAGTRACE VIAL (MISCELLANEOUS) IMPLANT
TRAY FAXITRON CT DISP (TRAY / TRAY PROCEDURE) ×2 IMPLANT
TUBE CONNECTING 20X1/4 (TUBING) ×2 IMPLANT
YANKAUER SUCT BULB TIP NO VENT (SUCTIONS) ×2 IMPLANT

## 2024-03-07 NOTE — Anesthesia Preprocedure Evaluation (Addendum)
 Anesthesia Evaluation  Patient identified by MRN, date of birth, ID band Patient awake    Reviewed: Allergy & Precautions, NPO status , Patient's Chart, lab work & pertinent test results  Airway Mallampati: II  TM Distance: >3 FB Neck ROM: Full    Dental no notable dental hx. (+) Teeth Intact, Dental Advisory Given   Pulmonary neg pulmonary ROS   Pulmonary exam normal breath sounds clear to auscultation       Cardiovascular Normal cardiovascular exam+ dysrhythmias Atrial Fibrillation  Rhythm:Regular Rate:Normal  TTE 2025 1. Left ventricular ejection fraction, by estimation, is 60 to 65%. The  left ventricle has normal function. The left ventricle has no regional  wall motion abnormalities. Left ventricular diastolic parameters were  normal. The average left ventricular  global longitudinal strain is -18.0 %. The global longitudinal strain is  normal.   2. Right ventricular systolic function is normal. The right ventricular  size is normal. There is normal pulmonary artery systolic pressure.   3. The mitral valve is normal in structure. Trivial mitral valve  regurgitation. No evidence of mitral stenosis.   4. The aortic valve is tricuspid. Aortic valve regurgitation is not  visualized. No aortic stenosis is present.   5. The inferior vena cava is normal in size with greater than 50%  respiratory variability, suggesting right atrial pressure of 3 mmHg.     Neuro/Psych negative neurological ROS  negative psych ROS   GI/Hepatic Neg liver ROS,GERD  ,,  Endo/Other  negative endocrine ROS    Renal/GU negative Renal ROS  negative genitourinary   Musculoskeletal negative musculoskeletal ROS (+)    Abdominal   Peds  Hematology negative hematology ROS (+)   Anesthesia Other Findings   Reproductive/Obstetrics                              Anesthesia Physical Anesthesia Plan  ASA:  2  Anesthesia Plan: General and Regional   Post-op Pain Management: Regional block* and Tylenol  PO (pre-op)*   Induction: Intravenous  PONV Risk Score and Plan: 3 and Ondansetron , Dexamethasone  and Midazolam   Airway Management Planned: LMA  Additional Equipment:   Intra-op Plan:   Post-operative Plan: Extubation in OR  Informed Consent: I have reviewed the patients History and Physical, chart, labs and discussed the procedure including the risks, benefits and alternatives for the proposed anesthesia with the patient or authorized representative who has indicated his/her understanding and acceptance.     Dental advisory given  Plan Discussed with: CRNA  Anesthesia Plan Comments:          Anesthesia Quick Evaluation

## 2024-03-07 NOTE — H&P (Signed)
 Subjective    Chief Complaint: Follow-up (hx of Br Ca, fu after tx completion and MRI)       History of Present Illness: Martha Washington is a 58 y.o. female who is seen today for breast cancer follow-up.   Breast MDC 08/24/23 Iruku/ [place]   History of Present Illness: Martha Washington is a 58 y.o. female who is seen today as an office consultation at the request of Dr. Loretha for evaluation of Breast Cancer .   This is a 58 year old female who recently underwent screening mammogram.  She was recalled for calcifications in the left upper outer quadrant.  She underwent further examination including diagnostic mammogram and ultrasound.  This revealed a 3.2 x 2.2 x 2.9 cm area of calcifications.  Ultrasound showed an irregular hypoechoic mass located at 2:00, 9 cm from the nipple.  This measured 2.1 x 1.6 x 1.6 cm.  There is a satellite mass approximately 2 cm away located at 2:00, 7 cm from the nipple.  This measures 0.4 x 0.3 x 0.2 cm.  Ultrasound of the axilla was negative.   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   The patient recently completed a course of neoadjuvant chemotherapy.  She underwent imaging that showed no sign of residual cancer.  She presents now to discuss surgical options.  She voiced some concerns about radiation therapy but she does not remember much of her discussion with Dr. Izell at her initial visit.  She has been involved in some online breast cancer patient forearms.   Her last chemotherapy treatment was 2 weeks ago. Medical History:     Past Medical History:  Diagnosis Date   Anxiety     Arthritis     GERD (gastroesophageal reflux disease)     History of cancer           Patient Active Problem List  Diagnosis   Allergic rhinitis   Atrial fibrillation with RVR (CMS/HHS-HCC)   Cervicalgia   Chronic low back pain   Degenerative spondylolisthesis   Depressive disorder    Gastroesophageal reflux disease   High cholesterol   Lumbar radiculopathy           Past Surgical History:  Procedure Laterality Date   HYSTERECTOMY       JOINT REPLACEMENT               Allergies  Allergen Reactions   Opioids - Morphine Analogues Dizziness, Nausea And Vomiting, Other (See Comments), Tinnitus and Vomiting      Made patient pass out   passed out when taking after last surgery   Azithromycin Abdominal Pain and Other (See Comments)      Stomach Cramps   Erythromycin Other (See Comments)      Other reaction(s): Other, stomach upset   Other Nausea And Vomiting            Current Outpatient Medications on File Prior to Visit  Medication Sig Dispense Refill   b complex vitamins capsule Take 1 capsule by mouth once daily       escitalopram  oxalate (LEXAPRO ) 10 MG tablet Take 1 tablet by mouth once daily       omeprazole (PRILOSEC) 40 MG DR capsule TAKE 1 CAPSULE BY MOUTH EVERY MORNING 30MINUTES BEFORE BREAKFAST FOR 30 DAYS for 90 days       ondansetron  (ZOFRAN ) 8 MG tablet Take 8 mg by mouth       prochlorperazine  (  COMPAZINE ) 10 MG tablet Take 10 mg by mouth every 6 (six) hours as needed for Nausea or Vomiting       ZEPBOUND  2.5 mg/0.5 mL pen injector Inject 2.5 mg subcutaneously       ZEPBOUND  5 mg/0.5 mL pen injector Inject 5 mg subcutaneously        No current facility-administered medications on file prior to visit.           Family History  Problem Relation Age of Onset   Hyperlipidemia (Elevated cholesterol) Mother     High blood pressure (Hypertension) Mother     Obesity Father     Coronary Artery Disease (Blocked arteries around heart) Father     High blood pressure (Hypertension) Father     Hyperlipidemia (Elevated cholesterol) Father        Social History       Tobacco Use  Smoking Status Never  Smokeless Tobacco Never      Social History        Socioeconomic History   Marital status: Married  Tobacco Use   Smoking status: Never    Smokeless tobacco: Never  Substance and Sexual Activity   Alcohol use: Not Currently   Drug use: Not Currently    Social Drivers of Health          Received from [employer]    Social Network  Housing Stability: Unknown (01/23/2024)    Housing Stability Vital Sign     Homeless in the Last Year: No      Objective:         Vitals:    01/23/24 1057  PainSc: 0-No pain  PainLoc: Breast      Physical Exam      Constitutional:  WDWN in NAD, conversant, no obvious deformities; lying in bed comfortably Eyes:  Pupils equal, round; sclera anicteric; moist conjunctiva; no lid lag HENT:  Oral mucosa moist; good dentition  Neck:  No masses palpated, trachea midline; no thyromegaly Lungs:  CTA bilaterally; normal respiratory effort Breasts:  symmetric, no nipple changes; no palpable masses or lymphadenopathy on either side  CV:  Regular rate and rhythm; no murmurs; extremities well-perfused with no edema Abd:  +bowel sounds, soft, non-tender, no palpable organomegaly; no palpable hernias Musc:  Normal gait; no apparent clubbing or cyanosis in extremities Lymphatic:  No palpable cervical or axillary lymphadenopathy Skin:  Warm, dry; no sign of jaundice Psychiatric - alert and oriented x 4; calm mood and affect     Labs, Imaging and Diagnostic Testing:   CLINICAL DATA:  Assess treatment response to neoadjuvant chemotherapy. Patient diagnosed with left breast malignancy on ultrasound-guided core needle biopsy performed on 08/16/2023. She underwent a subsequent biopsy of an adjacent smaller left breast mass on 09/12/2023 with benign concordant results.   EXAM: BILATERAL BREAST MRI WITH AND WITHOUT CONTRAST   TECHNIQUE: Multiplanar, multisequence MR images of both breasts were obtained prior to and following the intravenous administration of 9 ml of Vueway .   Three-dimensional MR images were rendered by post-processing of the original MR data on an independent workstation.  The three-dimensional MR images were interpreted, and findings are reported in the following complete MRI report for this study. Three dimensional images were evaluated at the independent interpreting workstation using the DynaCAD thin client.   COMPARISON:  Prior exams.  Previous breast MRI dated 08/31/2023.   FINDINGS: Breast composition: b. Scattered fibroglandular tissue.   Background parenchymal enhancement: Minimal   Right breast: No mass or abnormal enhancement.  Left breast: There has been near complete resolution of the abnormal enhancement and associated mass in the posterior, upper outer left breast. A 7 mm minimal focus of residual enhancement is seen approximately 1.4 cm anterior to the heart shaped post biopsy marker clip. Mammographically stable, small benign enhancing oval mass in the lateral breast, mid depth, unchanged from the prior MRI. There are no other areas of abnormal left breast enhancement. More pronounced susceptibility artifact from the coil shaped post biopsy marker clip lies anterior and superior to the heart shaped clip.   Lymph nodes: No abnormal appearing lymph nodes.   Ancillary findings:  None.   IMPRESSION: 1. Marked positive response to neoadjuvant chemotherapy with a near complete resolution of the abnormal enhancement and associated mass in the posterior, upper outer left breast. 2. No new abnormalities.   RECOMMENDATION: 1. Continue treatment as planned for the known left breast malignancy.   BI-RADS CATEGORY  6: Known biopsy-proven malignancy.     Electronically Signed   By: Alm Parkins M.D.   On: 01/16/2024 13:35       Assessment and Plan:  Diagnoses and all orders for this visit:   Invasive ductal carcinoma of breast, left (CMS/HHS-HCC)     S/p neoadjuvant chemotherapy with complete radiologic response   I had a long discussion with the patient and her husband regarding her treatment options.  She had an excellent  response to neoadjuvant chemotherapy.  I tried alleviate her concerns about radiation therapy and the effect on her cardiopulmonary function.  We discussed the surgical options which include mastectomy versus breast conservation.  We discussed left breast radioactive seed localized lumpectomy with sentinel of node biopsy versus mastectomy with or without reconstruction and sentinel lymph node biopsy.  After a very long discussion, the patient has opted for breast conservation.  We will proceed with scheduling a left breast radioactive seed localized lumpectomy with left axillary deep sentinel lymph node biopsy.The surgical procedure has been discussed with the patient.  Potential risks, benefits, alternative treatments, and expected outcomes have been explained.  All of the patient's questions at this time have been answered.  The likelihood of reaching the patient's treatment goal is good.  The patient understands the proposed surgical procedure and wishes to proceed.    Donnice POUR. Belinda, MD, [facility]  General Surgery   03/07/2024 9:58 AM

## 2024-03-07 NOTE — Anesthesia Procedure Notes (Addendum)
 Anesthesia Regional Block: Pectoralis block   Pre-Anesthetic Checklist: , timeout performed,  Correct Patient, Correct Site, Correct Laterality,  Correct Procedure, Correct Position, site marked,  Risks and benefits discussed,  Pre-op evaluation,  At surgeon's request and post-op pain management  Laterality: Left  Prep: Maximum Sterile Barrier Precautions used, chloraprep       Needles:  Injection technique: Single-shot  Needle Type: Echogenic Stimulator Needle     Needle Length: 9cm  Needle Gauge: 21     Additional Needles:   Procedures:,,,, ultrasound used (permanent image in chart),,    Narrative:  Start time: 03/07/2024 9:15 AM End time: 03/07/2024 9:20 AM Injection made incrementally with aspirations every 5 mL. Anesthesiologist: Niels Marien CROME, MD

## 2024-03-07 NOTE — Discharge Instructions (Addendum)
 [facility] Office Phone Number [phone]  BREAST BIOPSY/ PARTIAL MASTECTOMY: POST OP INSTRUCTIONS  Always review your discharge instruction sheet given to you by the facility where your surgery was performed.  IF YOU HAVE DISABILITY OR FAMILY LEAVE FORMS, YOU MUST BRING THEM TO THE OFFICE FOR PROCESSING.  DO NOT GIVE THEM TO YOUR DOCTOR.  A prescription for pain medication may be given to you upon discharge.  Take your pain medication as prescribed, if needed.  If narcotic pain medicine is not needed, then you may take acetaminophen  (Tylenol ) or ibuprofen  (Advil ) as needed. Take your usually prescribed medications unless otherwise directed If you need a refill on your pain medication, please contact your pharmacy.  They will contact our office to request authorization.  Prescriptions will not be filled after 5pm or on week-ends. You should eat very light the first 24 hours after surgery, such as soup, crackers, pudding, etc.  Resume your normal diet the day after surgery. Most patients will experience some swelling and bruising in the breast.  Ice packs and a good support bra will help.  Swelling and bruising can take several days to resolve.  It is common to experience some constipation if taking pain medication after surgery.  Increasing fluid intake and taking a stool softener will usually help or prevent this problem from occurring.  A mild laxative (Milk of Magnesia or Miralax ) should be taken according to package directions if there are no bowel movements after 48 hours. Unless discharge instructions indicate otherwise, you may remove your bandages 24-48 hours after surgery, and you may shower at that time.  You may have steri-strips (small skin tapes) in place directly over the incision.  These strips should be left on the skin for 7-10 days.  If your surgeon used skin glue on the incision, you may shower in 24 hours.  The glue will flake off over the next 2-3 weeks.  Any  sutures or staples will be removed at the office during your follow-up visit. ACTIVITIES:  You may resume regular daily activities (gradually increasing) beginning the next day.  Wearing a good support bra or sports bra minimizes pain and swelling.  You may have sexual intercourse when it is comfortable. You may drive when you no longer are taking prescription pain medication, you can comfortably wear a seatbelt, and you can safely maneuver your car and apply brakes. RETURN TO WORK:  ______________________________________________________________________________________ Rosine should see your doctor in the office for a follow-up appointment approximately two weeks after your surgery.  Your doctor's nurse will typically make your follow-up appointment when she calls you with your pathology report.  Expect your pathology report 2-3 business days after your surgery.  You may call to check if you do not hear from us  after three days. OTHER INSTRUCTIONS: _______________________________________________________________________________________________ _____________________________________________________________________________________________________________________________________ _____________________________________________________________________________________________________________________________________ _____________________________________________________________________________________________________________________________________  WHEN TO CALL YOUR DOCTOR: Fever over 101.0 Nausea and/or vomiting. Extreme swelling or bruising. Continued bleeding from incision. Increased pain, redness, or drainage from the incision.  The clinic staff is available to answer your questions during regular business hours.  Please don't hesitate to call and ask to speak to one of the nurses for clinical concerns.  If you have a medical emergency, go to the nearest emergency room or call 911.  A surgeon from [facility] is always on call at the hospital.  For further questions, please visit [email or web address]     Post Anesthesia Home Care Instructions  Activity: Get plenty of rest for the remainder  of the day. A responsible individual must stay with you for 24 hours following the procedure.  For the next 24 hours, DO NOT: -Drive a car -Advertising copywriter -Drink alcoholic beverages -Take any medication unless instructed by your physician -Make any legal decisions or sign important papers.  Meals: Start with liquid foods such as gelatin or soup. Progress to regular foods as tolerated. Avoid greasy, spicy, heavy foods. If nausea and/or vomiting occur, drink only clear liquids until the nausea and/or vomiting subsides. Call your physician if vomiting continues.  Special Instructions/Symptoms: Your throat may feel dry or sore from the anesthesia or the breathing tube placed in your throat during surgery. If this causes discomfort, gargle with warm salt water . The discomfort should disappear within 24 hours.  If you had a scopolamine  patch placed behind your ear for the management of post- operative nausea and/or vomiting:  1. The medication in the patch is effective for 72 hours, after which it should be removed.  Wrap patch in a tissue and discard in the trash. Wash hands thoroughly with soap and water . 2. You may remove the patch earlier than 72 hours if you experience unpleasant side effects which may include dry mouth, dizziness or visual disturbances. 3. Avoid touching the patch. Wash your hands with soap and water  after contact with the patch.   Regional Anesthesia Blocks  1. You may not be able to move or feel the blocked extremity after a regional anesthetic block. This may last may last from 3-48 hours after placement, but it will go away. The length of time depends on the medication injected and your individual response to the medication. As the nerves start to wake up,  you may experience tingling as the movement and feeling returns to your extremity. If the numbness and inability to move your extremity has not gone away after 48 hours, please call your surgeon.   2. The extremity that is blocked will need to be protected until the numbness is gone and the strength has returned. Because you cannot feel it, you will need to take extra care to avoid injury. Because it may be weak, you may have difficulty moving it or using it. You may not know what position it is in without looking at it while the block is in effect.  3. For blocks in the legs and feet, returning to weight bearing and walking needs to be done carefully. You will need to wait until the numbness is entirely gone and the strength has returned. You should be able to move your leg and foot normally before you try and bear weight or walk. You will need someone to be with you when you first try to ensure you do not fall and possibly risk injury.  4. Bruising and tenderness at the needle site are common side effects and will resolve in a few days.  5. Persistent numbness or new problems with movement should be communicated to the surgeon or the [facility] [phone] [facility] [phone]).  No tylenol  until after 2:42pm today.  No ibuprofen  until after 7:10pm.

## 2024-03-07 NOTE — Progress Notes (Signed)
Assisted Dr. Woodrum with left, pectoralis, ultrasound guided block. Side rails up, monitors on throughout procedure. See vital signs in flow sheet. Tolerated Procedure well. 

## 2024-03-07 NOTE — Anesthesia Postprocedure Evaluation (Signed)
 Anesthesia Post Note  Patient: DEYRA PERDOMO  Procedure(s) Performed: BREAST LUMPECTOMY WITH RADIOACTIVE SEED LOCALIZATION (Left: Breast) BIOPSY, LYMPH NODE, SENTINEL, AXILLARY (Left: Axilla)     Patient location during evaluation: PACU Anesthesia Type: Regional and General Level of consciousness: awake and alert Pain management: pain level controlled Vital Signs Assessment: post-procedure vital signs reviewed and stable Respiratory status: spontaneous breathing, nonlabored ventilation, respiratory function stable and patient connected to nasal cannula oxygen Cardiovascular status: blood pressure returned to baseline and stable Postop Assessment: no apparent nausea or vomiting Anesthetic complications: no   No notable events documented.  Last Vitals:  Vitals:   03/07/24 1200 03/07/24 1229  BP: 128/73 123/72  Pulse: 70 65  Resp: 16 16  Temp:  (!) 36.1 C  SpO2: 96% 94%    Last Pain:  Vitals:   03/07/24 1229  TempSrc:   PainSc: 0-No pain                 Saw Mendenhall L Farheen Pfahler

## 2024-03-07 NOTE — Op Note (Signed)
 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 Anesthesia:  GEN - LMA/ PEC block Indications:  This is a 57 year old female who recently underwent screening mammogram.  She was recalled for calcifications in the left upper outer quadrant.  She underwent further examination including diagnostic mammogram and ultrasound.  This revealed a 3.2 x 2.2 x 2.9 cm area of calcifications.  Ultrasound showed an irregular hypoechoic mass located at 2:00, 9 cm from the nipple.  This measured 2.1 x 1.6 x 1.6 cm.  There is a satellite mass approximately 2 cm away located at 2:00, 7 cm from the nipple.  This measures 0.4 x 0.3 x 0.2 cm.  Ultrasound of the axilla was negative.   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   The patient recently completed a course of neoadjuvant chemotherapy.  She underwent imaging that showed no sign of residual cancer.  Description of procedure: The patient is brought to the operating room placed in supine position on the operating room table. After an adequate level of general anesthesia was obtained, her left breast was prepped with ChloraPrep and draped in sterile fashion. A timeout was taken to ensure the proper patient and proper procedure. We interrogated the breast with the neoprobe. We made a transverse incision in the lateral breast after infiltrating with 0.25% Marcaine . Dissection was carried down in the breast tissue with cautery. We used the neoprobe to guide us  towards the radioactive seed. We excised an area of tissue around the radioactive seed 2 cm in diameter. The specimen was removed and was oriented with a paint kit. Specimen mammogram showed the radioactive seed as well as the biopsy clip within the specimen. This was  sent for pathologic examination. There is no residual radioactivity within the biopsy cavity. Clips were placed in all five margins.  We inspected carefully for hemostasis. The wound was thoroughly irrigated.   We then turned our attention to the axilla.  The settings were adjusted on the Neoprobe and we interrogated the axilla.  I made a transverse incision over the area of activity.  We dissected into the axilla and identified a radioactive deep lymph node.  This was dissected free and sent as sentinel lymph node #1.  We interrogated the axilla again and a second node was identified.  This was sent as sentinel lymph node #2.  There was minimal background activity.  The wounds were irrigated and inspected for hemostasis.  The wounds were closed with a deep layer of 3-0 Vicryl and a subcuticular layer of 4-0 Monocryl. Benzoin Steri-Strips were applied. The patient was then extubated and brought to the recovery room in stable condition. All sponge, instrument, and needle counts are correct.  DONNICE POUR. BELINDA, MD, [facility]  General Surgery   03/07/2024 11:26 AM

## 2024-03-07 NOTE — Transfer of Care (Signed)
 Immediate Anesthesia Transfer of Care Note  Patient: Martha Washington  Procedure(s) Performed: BREAST LUMPECTOMY WITH RADIOACTIVE SEED LOCALIZATION (Left: Breast) BIOPSY, LYMPH NODE, SENTINEL, AXILLARY (Left: Axilla)  Patient Location: PACU  Anesthesia Type:GA combined with regional for post-op pain  Level of Consciousness: drowsy  Airway & Oxygen Therapy: Patient Spontanous Breathing and Patient connected to face mask oxygen  Post-op Assessment: Report given to RN and Post -op Vital signs reviewed and stable  Post vital signs: Reviewed and stable  Last Vitals:  Vitals Value Taken Time  BP 118/72 03/07/24 11:28  Temp    Pulse 66 03/07/24 11:29  Resp 10 03/07/24 11:29  SpO2 99 % 03/07/24 11:29  Vitals shown include unfiled device data.  Last Pain:  Vitals:   03/07/24 0840  TempSrc: Tympanic  PainSc: 0-No pain      Patients Stated Pain Goal: 6 (03/07/24 0840)  Complications: No notable events documented.

## 2024-03-07 NOTE — Anesthesia Procedure Notes (Signed)
 Procedure Name: LMA Insertion Date/Time: 03/07/2024 10:17 AM  Performed by: Denton Niels CROME, CRNAPre-anesthesia Checklist: Patient identified, Emergency Drugs available, Suction available, Patient being monitored and Timeout performed Patient Re-evaluated:Patient Re-evaluated prior to induction Oxygen Delivery Method: Circle system utilized Preoxygenation: Pre-oxygenation with 100% oxygen Induction Type: IV induction Ventilation: Mask ventilation without difficulty LMA: LMA inserted LMA Size: 4.0 Number of attempts: 2 Placement Confirmation: positive ETCO2 Tube secured with: Tape Dental Injury: Teeth and Oropharynx as per pre-operative assessment

## 2024-03-07 NOTE — Progress Notes (Signed)
 Nuc med staff performed nuc med injection. Pt tol well with no additional sedation/pain medication.

## 2024-03-08 ENCOUNTER — Encounter (HOSPITAL_BASED_OUTPATIENT_CLINIC_OR_DEPARTMENT_OTHER): Payer: Self-pay | Admitting: Surgery

## 2024-03-12 ENCOUNTER — Encounter: Payer: Self-pay | Admitting: *Deleted

## 2024-03-13 ENCOUNTER — Ambulatory Visit: Payer: Self-pay | Admitting: Surgery

## 2024-03-13 LAB — SURGICAL PATHOLOGY

## 2024-03-19 ENCOUNTER — Encounter: Payer: Self-pay | Admitting: Hematology and Oncology

## 2024-03-19 ENCOUNTER — Other Ambulatory Visit: Payer: Self-pay | Admitting: Pharmacist

## 2024-03-19 ENCOUNTER — Inpatient Hospital Stay: Attending: Hematology and Oncology | Admitting: Hematology and Oncology

## 2024-03-19 VITALS — BP 110/61 | HR 66 | Temp 98.2°F | Resp 14 | Wt 191.6 lb

## 2024-03-19 DIAGNOSIS — Z170 Estrogen receptor positive status [ER+]: Secondary | ICD-10-CM

## 2024-03-19 DIAGNOSIS — I4891 Unspecified atrial fibrillation: Secondary | ICD-10-CM | POA: Diagnosis not present

## 2024-03-19 DIAGNOSIS — N6489 Other specified disorders of breast: Secondary | ICD-10-CM | POA: Insufficient documentation

## 2024-03-19 DIAGNOSIS — C50412 Malignant neoplasm of upper-outer quadrant of left female breast: Secondary | ICD-10-CM | POA: Insufficient documentation

## 2024-03-19 DIAGNOSIS — Z5112 Encounter for antineoplastic immunotherapy: Secondary | ICD-10-CM | POA: Diagnosis not present

## 2024-03-19 DIAGNOSIS — E860 Dehydration: Secondary | ICD-10-CM

## 2024-03-19 DIAGNOSIS — R000 Tachycardia, unspecified: Secondary | ICD-10-CM | POA: Diagnosis not present

## 2024-03-19 DIAGNOSIS — Z79899 Other long term (current) drug therapy: Secondary | ICD-10-CM | POA: Diagnosis not present

## 2024-03-19 DIAGNOSIS — K219 Gastro-esophageal reflux disease without esophagitis: Secondary | ICD-10-CM | POA: Insufficient documentation

## 2024-03-19 DIAGNOSIS — E7800 Pure hypercholesterolemia, unspecified: Secondary | ICD-10-CM | POA: Insufficient documentation

## 2024-03-19 NOTE — Progress Notes (Signed)
 [facility] CONSULT NOTE  Patient Care Team: Default, Provider, MD as PCP - General Tobb, Kardie, DO as PCP - Cardiology (Cardiology) Glean Stephane BROCKS, RN (Inactive) as Oncology Nurse Navigator Tyree Nanetta SAILOR, RN as Oncology Nurse Navigator Loretha Ash, MD as Consulting Physician (Hematology and Oncology) Belinda Cough, MD as Consulting Physician (General Surgery) Izell Domino, MD as Attending Physician (Radiation Oncology)  CHIEF COMPLAINTS/PURPOSE OF CONSULTATION:  Newly diagnosed breast cancer  HISTORY OF PRESENTING ILLNESS:  Martha Washington 58 y.o. female is here because of recent diagnosis of left breast cancer  I reviewed her records extensively and collaborated the history with the patient.  SUMMARY OF ONCOLOGIC HISTORY: Oncology History  Malignant neoplasm of upper-outer quadrant of left breast in female, estrogen receptor positive (HCC)  08/04/2023 Mammogram   Further evaluation is suggested for calcifications in the left breast.  There is a group of coarse heterogeneous calcifications associated with an asymmetry in the UPPER OUTER QUADRANT at posterior depth spanning approximately 3.2 x 2.2 x 2.9 cm (AP x transverse x CC). There are linear and branching forms within the large group. There is a possible mass at the posterior margin of the calcifications measuring just under 1 cm in size.   Targeted ultrasound is performed, demonstrating an irregular hypoechoic mass with angular margins and associated calcifications at 2 o'clock 9 cm from the nipple measuring approximately 2.1 x 1.6 x 1.6 cm, demonstrating posterior acoustic shadowing and demonstrating internal power Doppler flow. There is a 0.4 x 0.3 x 0.2 cm satellite mass at 2 o'clock 7 cm from the nipple which is approximately 2 cm removed from the dominant mass.     08/16/2023 Pathology Results     08/22/2023 Initial Diagnosis   Malignant neoplasm of upper-outer quadrant of left breast in  female, estrogen receptor positive (HCC)   08/24/2023 Cancer Staging   Staging form: Breast, AJCC 8th Edition - Clinical stage from 08/24/2023: Stage IB (cT2, cN0, cM0, G2, ER+, PR+, HER2+) - Signed by Loretha Ash, MD on 08/24/2023 Stage prefix: Initial diagnosis Histologic grading system: 3 grade system Laterality: Left Staged by: Pathologist and managing physician Stage used in treatment planning: Yes National guidelines used in treatment planning: Yes Type of national guideline used in treatment planning: NCCN   08/31/2023 Genetic Testing   Negative [facility] CancerNext+RNAinsight Panel.  Report date is 08/31/2023.   The [employer] CancerNext+RNAinsight Panel includes sequencing, rearrangement analysis, and RNA analysis for the following 39 genes: APC, ATM, BAP1, BARD1, BMPR1A, BRCA1, BRCA2, BRIP1, CDH1, CDKN2A, CHEK2, FH, FLCN, MET, MLH1, MSH2, MSH6, MUTYH, NF1, NTHL1, PALB2, PMS2, PTEN, RAD51C, RAD51D, SMAD4, STK11, TP53, TSC1, TSC2, and VHL (sequencing and deletion/duplication); AXIN2, HOXB13, MBD4, MSH3, POLD1 and POLE (sequencing only); EPCAM and GREM1 (deletion/duplication only).    09/16/2023 - 01/13/2024 Chemotherapy   Patient is on Treatment Plan : BREAST  Docetaxel  + Carboplatin  + Trastuzumab  + Pertuzumab   (TCHP) q21d      04/02/2024 -  Chemotherapy   Patient is on Treatment Plan : BREAST Trastuzumab  + Pertuzumab  q21d x 11 cycles      Discussed the use of AI scribe software for clinical note transcription with the patient, who gave verbal consent to proceed. History of Present Illness  Martha Washington is a 58 year old female with HER2-positive breast cancer who presents for follow-up after lumpectomy and chemotherapy. She is accompanied by her daughter, Vernell.  She underwent a lumpectomy following chemotherapy for HER2-positive breast cancer. 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 Two lymph  nodes were clear, indicating a complete pathologic response. She is currently not undergoing chemotherapy but will continue with antibody treatments, specifically Herceptin  and Perjeta , for a year.  She experiences some swelling at the surgical site, which has improved over time. She reports some swelling at the surgical site, which has improved over time. She remained active after surgery, including caring for her grandson. She reports that the surgery was the easiest she has had, with minimal side effects from anesthesia, which she attributes to pre-surgical preparation with Ensure.  She is also hoping to work with integrative medicine doctor down the lane.  Rest of the pertinent 10 point ROS reviewed and neg.  MEDICAL HISTORY:  Past Medical History:  Diagnosis Date   Atrial fibrillation with RVR (HCC)    GERD (gastroesophageal reflux disease)    no meds   High cholesterol    Menorrhagia 07/13/2013   Missed abortion    resolved - no surgery required   New onset atrial fibrillation (HCC) 08/04/2017   with RVR to the 160s; S/P hammertoe OR/notes 08/04/2017   S/P bunionectomy 08/04/2017   SVD (spontaneous vaginal delivery) X 2   Tachycardia 08/04/2017    SURGICAL HISTORY: Past Surgical History:  Procedure Laterality Date   ABDOMINAL HYSTERECTOMY     AXILLARY SENTINEL NODE BIOPSY Left 03/07/2024   Procedure: BIOPSY, LYMPH NODE, SENTINEL, AXILLARY;  Surgeon: Belinda Cough, MD;  Location: [facility];  Service: General;  Laterality: Left;  LEFT BREAST RADIOACTIVE SEED LOCALIZED LUMPECTOMY LEFT AXILLARY DEEP SENTINEL LYMPH NODE BIOPSY LMA w/PEC BLOCK   BACK SURGERY     BREAST BIOPSY Left 08/16/2023   US  LT BREAST BX W LOC DEV 1ST LESION IMG BX SPEC US  GUIDE 08/16/2023 [facility]MO[facility]   BREAST BIOPSY Left 09/12/2023   US  LT BREAST BX W LOC DEV 1ST LESION IMG BX SPEC US  GUIDE 09/12/2023 [facility]MO[facility]   BREAST BIOPSY  03/06/2024   MM LT RADIOACTIVE SEED LOC MAMMO GUIDE  03/06/2024 [facility]MO[facility]   BREAST LUMPECTOMY WITH RADIOACTIVE SEED LOCALIZATION Left 03/07/2024   Procedure: BREAST LUMPECTOMY WITH RADIOACTIVE SEED LOCALIZATION;  Surgeon: Belinda Cough, MD;  Location: [facility];  Service: General;  Laterality: Left;   BUNIONECTOMY WITH HAMMERTOE RECONSTRUCTION Right 03/2017   BUNIONECTOMY WITH HAMMERTOE RECONSTRUCTION Left 08/04/2017   BUNIONECTOMY WITH HAMMERTOE RECONSTRUCTION Left 08/04/2017   Procedure: Left modified McBride and Lapidus bunion corrections; Left 2-3 Weil and hammertoe corrections;  Surgeon: Kit Rush, MD;  Location: [facility];  Service: Orthopedics;  Laterality: Left;   PORTACATH PLACEMENT Right 09/15/2023   Procedure: INSERTION PORT-A-CATH WITH ULTASOUND GUIDANCE;  Surgeon: Belinda Cough, MD;  Location: [facility];  Service: General;  Laterality: Right;  Leave port accessed   ROBOTIC ASSISTED TOTAL HYSTERECTOMY WITH BILATERAL SALPINGO OOPHERECTOMY N/A 07/13/2013   Procedure: ROBOTIC ASSISTED TOTAL HYSTERECTOMY WITH BILATERAL SALPINGECTOMY;  Surgeon: Nena DELENA App, MD;  Location: [facility];  Service: Gynecology;  Laterality: N/A;   SHOULDER ARTHROSCOPY W/ ROTATOR CUFF REPAIR Right 2010   SHOULDER ARTHROSCOPY WITH ROTATOR CUFF REPAIR Right 10/17/2014   Procedure: SHOULDER ARTHROSCOPY WITH ROTATOR CUFF REPAIR;  Surgeon: Toribio Chancy, MD;  Location:  SURGERY CENTER;  Service: Orthopedics;  Laterality: Right;   TUBAL LIGATION     WEIL OSTEOTOMY Left 08/04/2017   Procedure: Left 2nd and 3rd weil osteotomy;  Surgeon: Kit Rush, MD;  Location: [facility];  Service: Orthopedics;  Laterality: Left;   WISDOM TOOTH EXTRACTION      SOCIAL HISTORY: Social History   Socioeconomic History   Marital status: Married    Spouse name: Merchant navy officer   Number of children: Not on file   Years of education: Not on file   Highest education level: Not on file  Occupational History   Not on  file  Tobacco Use   Smoking status: Never   Smokeless tobacco: Never  Vaping Use   Vaping status: Never Used  Substance and Sexual Activity   Alcohol use: No   Drug use: No   Sexual activity: Not on file  Other Topics Concern   Not on file  Social History Narrative   Lives with husband   Right Handed   Drinks 1 cup caffeine daily   Social Drivers of Corporate investment banker Strain: Not on file  Food Insecurity: Not on file  Transportation Needs: Not on file  Physical Activity: Not on file  Stress: Not on file  Social Connections: Unknown (12/22/2021)   [facility]   Social Network    Social Network: Not on file  Intimate Partner Violence: Unknown (11/13/2021)   Received from [facility]   HITS    Physically Hurt: Not on file    Insult or Talk Down To: Not on file    Threaten Physical Harm: Not on file    Scream or Curse: Not on file    FAMILY HISTORY: Family History  Problem Relation Age of Onset   Hypertension Mother    Hypertension Father    Bone cancer Brother 26       or other primary?    ALLERGIES:  is allergic to azithromycin, morphine and codeine, and oxycodone .  MEDICATIONS:  Current Outpatient Medications  Medication Sig Dispense Refill   Ascorbic Acid (VITAMIN C) 1000 MG tablet Take 1,000 mg by mouth daily.     b complex vitamins capsule Take 1 capsule by mouth daily.     escitalopram  (LEXAPRO ) 10 MG tablet Take 1 tablet (10 mg total) by mouth daily. 90 tablet 0   traMADol  (ULTRAM ) 50 MG tablet Take 1-2 tablets (50-100 mg total) by mouth every 6 (six) hours as needed for moderate pain (pain score 4-6) or severe pain (pain score 7-10). 20 tablet 0   VITAMIN D PO Take by mouth.     ZINC CITRATE PO Take by mouth.     No current facility-administered medications for this visit.    REVIEW OF SYSTEMS:   Constitutional: Denies fevers, chills or abnormal night sweats Eyes: Denies blurriness of vision, double vision or watery  eyes Ears, nose, mouth, throat, and face: Denies mucositis or sore throat Respiratory: Denies cough, dyspnea or wheezes Cardiovascular: Denies palpitation, chest discomfort or lower extremity swelling Gastrointestinal:  Denies nausea, heartburn or change in bowel habits Skin: Denies abnormal skin rashes Lymphatics: Denies new lymphadenopathy or easy bruising Neurological:Denies numbness, tingling or new weaknesses Behavioral/Psych: Mood is stable, no new changes  Breast: Denies any palpable lumps or discharge All other systems were reviewed with the patient and are negative.  PHYSICAL EXAMINATION: ECOG PERFORMANCE STATUS: 0 - Asymptomatic  Vitals:   03/19/24 0901  BP: 110/61  Pulse: 66  Resp: 14  Temp: 98.2 F (36.8 C)    Filed Weights   03/19/24 0901  Weight: 191 lb 9.6 oz (86.9 kg)    GENERAL:alert, no distress and comfortable Left breast  surgical lesion healing well. Some post op seroma noted.  LABORATORY DATA:  I have reviewed the data as listed Lab Results  Component Value Date   WBC 4.9 01/10/2024   HGB 12.1 01/10/2024   HCT 36.6 01/10/2024   MCV 85.5 01/10/2024   PLT 299 01/10/2024   Lab Results  Component Value Date   NA 141 01/10/2024   K 3.7 01/10/2024   CL 107 01/10/2024   CO2 26 01/10/2024    RADIOGRAPHIC STUDIES: I have personally reviewed the radiological reports and agreed with the findings in the report.  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 On Neoadj TCHP. Baseline ECHO with EF of 65-70%.  Assessment and Plan Assessment & Plan HER2-positive, ER/PR-positive breast cancer, post-lumpectomy, on adjuvant therapy Post-lumpectomy with complete pathologic response to chemotherapy. Residual DCIS  present. Continued adjuvant therapy with HP maintenance. HER2-positive status necessitates Herceptin  and Perjeta . Radiation therapy recommended.  - Continue Herceptin  and Perjeta  for one year, every three weeks. - Initiate radiation therapy. - Discuss antiestrogen therapy post-radiation. - Coordinate with integrative health provider ensuring no estrogen or progesterone use.  Seroma at surgical site, post-lumpectomy Mild seroma likely due to insufficient post-operative rest. - Advise wearing a supportive sports bra to compress the area and reduce fluid accumulation.    All questions were answered. The patient knows to call the clinic with any problems, questions or concerns.    Amber Stalls, MD 03/19/24

## 2024-03-19 NOTE — Progress Notes (Signed)
 Per Dr. Loretha, added maintenance pertuzumab  + trastuzumab  plan to start in two weeks. Signed appointment requests so she can be scheduled. Plan sent to Dr. Loretha for review and inquiry if she wants patient reloaded. Last received pertuzumab  + trastuzumab  in combination with chemotherapy on 01/10/24.  Slade Pierpoint A. Lucila, PharmD, BCOP, CPP 03/19/2024 9:59 AM

## 2024-03-19 NOTE — Assessment & Plan Note (Signed)
 Invasive Ductal Carcinoma, Left Breast Newly diagnosed, HER2 positive, estrogen receptor positive, with a primary mass measuring 2.1 cm and a satellite nodule of 4 mm. On Neoadj TCHP. Baseline ECHO with EF of 65-70%.  Assessment and Plan Assessment & Plan HER2-positive, ER/PR-positive breast cancer, post-lumpectomy, on adjuvant therapy Post-lumpectomy with complete pathologic response to chemotherapy. Residual DCIS present. Continued adjuvant therapy with HP maintenance. HER2-positive status necessitates Herceptin  and Perjeta . Radiation therapy recommended.  - Continue Herceptin  and Perjeta  for one year, every three weeks. - Initiate radiation therapy. - Discuss antiestrogen therapy post-radiation. - Coordinate with integrative health provider ensuring no estrogen or progesterone use.  Seroma at surgical site, post-lumpectomy Mild seroma likely due to insufficient post-operative rest. - Advise wearing a supportive sports bra to compress the area and reduce fluid accumulation.

## 2024-03-20 ENCOUNTER — Encounter: Payer: Self-pay | Admitting: Hematology and Oncology

## 2024-03-20 ENCOUNTER — Other Ambulatory Visit: Payer: Self-pay

## 2024-03-20 ENCOUNTER — Other Ambulatory Visit: Payer: Self-pay | Admitting: Hematology and Oncology

## 2024-03-22 ENCOUNTER — Encounter: Payer: Self-pay | Admitting: *Deleted

## 2024-03-23 ENCOUNTER — Other Ambulatory Visit: Payer: Self-pay

## 2024-03-30 NOTE — Progress Notes (Signed)
 Location of Breast Cancer: Malignant neoplasm of upper-outer quadrant of left breast in female, estrogen receptor positive (HCC)  Histology per Pathology Report:    Receptor Status: ER(Positive), PR (Positive), Her2-neu (Positive), Ki-67(25%)  Did patient present with symptoms (if so, please note symptoms) or was this found on screening mammography?:  Mammogram   Past/Anticipated interventions by surgeon, if any: 03/07/2024 Tseui, MD Left Radioactive Seed Localized Lumpectomy    Past/Anticipated interventions by medical oncology, if any:     Lymphedema issues, if any:   None   Pain issues, if any:  None   SAFETY ISSUES: Prior radiation? None Pacemaker/ICD? None Possible current pregnancy?None Is the patient on methotrexate? None  Current Complaints / other details:   None

## 2024-04-02 ENCOUNTER — Ambulatory Visit: Admitting: Hematology and Oncology

## 2024-04-02 ENCOUNTER — Ambulatory Visit

## 2024-04-02 ENCOUNTER — Inpatient Hospital Stay (HOSPITAL_BASED_OUTPATIENT_CLINIC_OR_DEPARTMENT_OTHER): Admitting: Hematology and Oncology

## 2024-04-02 ENCOUNTER — Inpatient Hospital Stay

## 2024-04-02 VITALS — BP 105/62 | HR 59 | Temp 97.7°F | Resp 15 | Wt 194.8 lb

## 2024-04-02 DIAGNOSIS — Z170 Estrogen receptor positive status [ER+]: Secondary | ICD-10-CM | POA: Diagnosis not present

## 2024-04-02 DIAGNOSIS — C50412 Malignant neoplasm of upper-outer quadrant of left female breast: Secondary | ICD-10-CM

## 2024-04-02 DIAGNOSIS — E860 Dehydration: Secondary | ICD-10-CM

## 2024-04-02 MED ORDER — ACETAMINOPHEN 325 MG PO TABS
650.0000 mg | ORAL_TABLET | Freq: Once | ORAL | Status: AC
  Administered 2024-04-02: 650 mg via ORAL
  Filled 2024-04-02: qty 2

## 2024-04-02 MED ORDER — SODIUM CHLORIDE 0.9 % IV SOLN
840.0000 mg | Freq: Once | INTRAVENOUS | Status: AC
  Administered 2024-04-02: 840 mg via INTRAVENOUS
  Filled 2024-04-02: qty 28

## 2024-04-02 MED ORDER — DIPHENHYDRAMINE HCL 25 MG PO CAPS
50.0000 mg | ORAL_CAPSULE | Freq: Once | ORAL | Status: AC
  Administered 2024-04-02: 50 mg via ORAL
  Filled 2024-04-02: qty 2

## 2024-04-02 MED ORDER — SODIUM CHLORIDE 0.9 % IV SOLN: INTRAVENOUS | Status: DC

## 2024-04-02 MED ORDER — TRASTUZUMAB-ANNS CHEMO 150 MG IV SOLR
8.0000 mg/kg | Freq: Once | INTRAVENOUS | Status: AC
  Administered 2024-04-02: 693 mg via INTRAVENOUS
  Filled 2024-04-02: qty 33

## 2024-04-02 NOTE — Assessment & Plan Note (Signed)
 Invasive Ductal Carcinoma, Left Breast Newly diagnosed, HER2 positive, estrogen receptor positive, with a primary mass measuring 2.1 cm and a satellite nodule of 4 mm. On Neoadj TCHP. Baseline ECHO with EF of 65-70%.  Assessment and Plan Assessment & Plan HER2-positive, ER/PR-positive breast cancer, post-lumpectomy, on adjuvant therapy Post-lumpectomy with complete pathologic response to chemotherapy. Residual DCIS present. Continued adjuvant therapy with HP maintenance. HER2-positive status necessitates Herceptin  and Perjeta . Radiation therapy recommended.  - Administer trastuzumab  and pertuzumab . - Adjust appointment length for future sessions.  Surveillance for cardiotoxicity due to adjuvant HER2-targeted therapy Ongoing surveillance for cardiotoxicity with regular echocardiograms every three months. - Order echocardiogram in a couple of weeks.  Anti estrogen therapy to follow adj radiation.

## 2024-04-02 NOTE — Progress Notes (Signed)
 [facility] CONSULT NOTE  Patient Care Team: Default, Provider, MD as PCP - General Tobb, Kardie, DO as PCP - Cardiology (Cardiology) Glean Stephane BROCKS, RN (Inactive) as Oncology Nurse Navigator Tyree Nanetta SAILOR, RN as Oncology Nurse Navigator Loretha Ash, MD as Consulting Physician (Hematology and Oncology) Belinda Cough, MD as Consulting Physician (General Surgery) Izell Domino, MD as Attending Physician (Radiation Oncology)  CHIEF COMPLAINTS/PURPOSE OF CONSULTATION:  Newly diagnosed breast cancer  HISTORY OF PRESENTING ILLNESS:  Martha Washington 58 y.o. female is here because of recent diagnosis of left breast cancer  I reviewed her records extensively and collaborated the history with the patient.  SUMMARY OF ONCOLOGIC HISTORY: Oncology History  Malignant neoplasm of upper-outer quadrant of left breast in female, estrogen receptor positive (HCC)  08/04/2023 Mammogram   Further evaluation is suggested for calcifications in the left breast.  There is a group of coarse heterogeneous calcifications associated with an asymmetry in the UPPER OUTER QUADRANT at posterior depth spanning approximately 3.2 x 2.2 x 2.9 cm (AP x transverse x CC). There are linear and branching forms within the large group. There is a possible mass at the posterior margin of the calcifications measuring just under 1 cm in size.   Targeted ultrasound is performed, demonstrating an irregular hypoechoic mass with angular margins and associated calcifications at 2 o'clock 9 cm from the nipple measuring approximately 2.1 x 1.6 x 1.6 cm, demonstrating posterior acoustic shadowing and demonstrating internal power Doppler flow. There is a 0.4 x 0.3 x 0.2 cm satellite mass at 2 o'clock 7 cm from the nipple which is approximately 2 cm removed from the dominant mass.     08/16/2023 Pathology Results     08/22/2023 Initial Diagnosis   Malignant neoplasm of upper-outer quadrant of left breast in  female, estrogen receptor positive (HCC)   08/24/2023 Cancer Staging   Staging form: Breast, AJCC 8th Edition - Clinical stage from 08/24/2023: Stage IB (cT2, cN0, cM0, G2, ER+, PR+, HER2+) - Signed by Loretha Ash, MD on 08/24/2023 Stage prefix: Initial diagnosis Histologic grading system: 3 grade system Laterality: Left Staged by: Pathologist and managing physician Stage used in treatment planning: Yes National guidelines used in treatment planning: Yes Type of national guideline used in treatment planning: NCCN   08/31/2023 Genetic Testing   Negative [facility] CancerNext+RNAinsight Panel.  Report date is 08/31/2023.   The [employer] CancerNext+RNAinsight Panel includes sequencing, rearrangement analysis, and RNA analysis for the following 39 genes: APC, ATM, BAP1, BARD1, BMPR1A, BRCA1, BRCA2, BRIP1, CDH1, CDKN2A, CHEK2, FH, FLCN, MET, MLH1, MSH2, MSH6, MUTYH, NF1, NTHL1, PALB2, PMS2, PTEN, RAD51C, RAD51D, SMAD4, STK11, TP53, TSC1, TSC2, and VHL (sequencing and deletion/duplication); AXIN2, HOXB13, MBD4, MSH3, POLD1 and POLE (sequencing only); EPCAM and GREM1 (deletion/duplication only).    09/16/2023 - 01/13/2024 Chemotherapy   Patient is on Treatment Plan : BREAST  Docetaxel  + Carboplatin  + Trastuzumab  + Pertuzumab   (TCHP) q21d      04/02/2024 -  Chemotherapy   Patient is on Treatment Plan : BREAST Trastuzumab  + Pertuzumab  q21d x 11 cycles      Discussed the use of AI scribe software for clinical note transcription with the patient, who gave verbal consent to proceed. History of Present Illness  Martha Washington is a 58 year old female with HER2-positive breast cancer who presents for follow-up after lumpectomy and chemotherapy.   Discussed the use of AI scribe software for clinical note transcription with the patient, who gave verbal consent to proceed.  History  of Present Illness  She is undergoing treatment for breast cancer and is here for her first infusion of Herceptin  and pertuzumab   following surgery. This is her first session of this treatment regimen post-surgery. She has a seroma in the left axilla, which has been getting smaller.  She is involved in her grandchildren's activities, such as attending baseball tournaments.  She denies any other major medical issues.  Last ECHO in June satisfactory  Rest of the pertinent 10 point ROS reviewed and neg.  MEDICAL HISTORY:  Past Medical History:  Diagnosis Date   Atrial fibrillation with RVR (HCC)    GERD (gastroesophageal reflux disease)    no meds   High cholesterol    Menorrhagia 07/13/2013   Missed abortion    resolved - no surgery required   New onset atrial fibrillation (HCC) 08/04/2017   with RVR to the 160s; S/P hammertoe OR/notes 08/04/2017   S/P bunionectomy 08/04/2017   SVD (spontaneous vaginal delivery) X 2   Tachycardia 08/04/2017    SURGICAL HISTORY: Past Surgical History:  Procedure Laterality Date   ABDOMINAL HYSTERECTOMY     AXILLARY SENTINEL NODE BIOPSY Left 03/07/2024   Procedure: BIOPSY, LYMPH NODE, SENTINEL, AXILLARY;  Surgeon: Belinda Cough, MD;  Location: [facility];  Service: General;  Laterality: Left;  LEFT BREAST RADIOACTIVE SEED LOCALIZED LUMPECTOMY LEFT AXILLARY DEEP SENTINEL LYMPH NODE BIOPSY LMA w/PEC BLOCK   BACK SURGERY     BREAST BIOPSY Left 08/16/2023   US  LT BREAST BX W LOC DEV 1ST LESION IMG BX SPEC US  GUIDE 08/16/2023 [facility]   BREAST BIOPSY Left 09/12/2023   US  LT BREAST BX W LOC DEV 1ST LESION IMG BX SPEC US  GUIDE 09/12/2023 [facility]MO[facility]   BREAST BIOPSY  03/06/2024   MM LT RADIOACTIVE SEED LOC MAMMO GUIDE 03/06/2024 [facility]MO[facility]   BREAST LUMPECTOMY WITH RADIOACTIVE SEED LOCALIZATION Left 03/07/2024   Procedure: BREAST LUMPECTOMY WITH RADIOACTIVE SEED LOCALIZATION;  Surgeon: Belinda Cough, MD;  Location: [facility];  Service: General;  Laterality: Left;   BUNIONECTOMY WITH HAMMERTOE RECONSTRUCTION Right 03/2017   BUNIONECTOMY  WITH HAMMERTOE RECONSTRUCTION Left 08/04/2017   BUNIONECTOMY WITH HAMMERTOE RECONSTRUCTION Left 08/04/2017   Procedure: Left modified McBride and Lapidus bunion corrections; Left 2-3 Weil and hammertoe corrections;  Surgeon: Kit Rush, MD;  Location: [facility];  Service: Orthopedics;  Laterality: Left;   PORTACATH PLACEMENT Right 09/15/2023   Procedure: INSERTION PORT-A-CATH WITH ULTASOUND GUIDANCE;  Surgeon: Belinda Cough, MD;  Location: [facility];  Service: General;  Laterality: Right;  Leave port accessed   ROBOTIC ASSISTED TOTAL HYSTERECTOMY WITH BILATERAL SALPINGO OOPHERECTOMY N/A 07/13/2013   Procedure: ROBOTIC ASSISTED TOTAL HYSTERECTOMY WITH BILATERAL SALPINGECTOMY;  Surgeon: Nena DELENA App, MD;  Location: [facility];  Service: Gynecology;  Laterality: N/A;   SHOULDER ARTHROSCOPY W/ ROTATOR CUFF REPAIR Right 2010   SHOULDER ARTHROSCOPY WITH ROTATOR CUFF REPAIR Right 10/17/2014   Procedure: SHOULDER ARTHROSCOPY WITH ROTATOR CUFF REPAIR;  Surgeon: Toribio Chancy, MD;  Location: [facility];  Service: Orthopedics;  Laterality: Right;   TUBAL LIGATION     WEIL OSTEOTOMY Left 08/04/2017   Procedure: Left 2nd and 3rd weil osteotomy;  Surgeon: Kit Rush, MD;  Location: [facility];  Service: Orthopedics;  Laterality: Left;   WISDOM TOOTH EXTRACTION      SOCIAL HISTORY: Social History   Socioeconomic History   Marital status: Married    Spouse name: Oneil   Number of children: Not  on file   Years of education: Not on file   Highest education level: Not on file  Occupational History   Not on file  Tobacco Use   Smoking status: Never   Smokeless tobacco: Never  Vaping Use   Vaping status: Never Used  Substance and Sexual Activity   Alcohol use: No   Drug use: No   Sexual activity: Not on file  Other Topics Concern   Not on file  Social History Narrative   Lives with husband   Right Handed   Drinks 1 cup caffeine daily    Social Drivers of Health   Financial Resource Strain: Not on file  Food Insecurity: Not on file  Transportation Needs: Not on file  Physical Activity: Not on file  Stress: Not on file  Social Connections: Unknown (12/22/2021)   Received from [facility]   Social Network    Social Network: Not on file  Intimate Partner Violence: Unknown (11/13/2021)   Received from [facility]   HITS    Physically Hurt: Not on file    Insult or Talk Down To: Not on file    Threaten Physical Harm: Not on file    Scream or Curse: Not on file    FAMILY HISTORY: Family History  Problem Relation Age of Onset   Hypertension Mother    Hypertension Father    Bone cancer Brother 63       or other primary?    ALLERGIES:  is allergic to azithromycin, morphine and codeine, and oxycodone .  MEDICATIONS:  Current Outpatient Medications  Medication Sig Dispense Refill   celecoxib (CELEBREX) 100 MG capsule Take 100 mg by mouth daily.     dipyridamole (PERSANTINE) 75 MG tablet Take 75 mg by mouth in the morning and at bedtime.     escitalopram  (LEXAPRO ) 10 MG tablet Take 1 tablet (10 mg total) by mouth daily. 90 tablet 0   Hydroxychloroquine Sulfate 100 MG TABS Take 100 mg by mouth in the morning and at bedtime.     metFORMIN (GLUCOPHAGE) 500 MG tablet Take 500 mg by mouth 2 (two) times daily with a meal.     simvastatin (ZOCOR) 20 MG tablet Take 20 mg by mouth daily at 6 PM.     VITAMIN D PO Take by mouth.     Ascorbic Acid (VITAMIN C) 1000 MG tablet Take 1,000 mg by mouth daily.     b complex vitamins capsule Take 1 capsule by mouth daily.     traMADol  (ULTRAM ) 50 MG tablet Take 1-2 tablets (50-100 mg total) by mouth every 6 (six) hours as needed for moderate pain (pain score 4-6) or severe pain (pain score 7-10). 20 tablet 0   ZINC CITRATE PO Take by mouth.     No current facility-administered medications for this visit.   Facility-Administered Medications Ordered in Other Visits  Medication  Dose Route Frequency Provider Last Rate Last Admin   0.9 %  sodium chloride  infusion   Intravenous Continuous Kymari Nuon, MD       acetaminophen  (TYLENOL ) tablet 650 mg  650 mg Oral Once Tavarius Grewe, MD       diphenhydrAMINE  (BENADRYL ) capsule 50 mg  50 mg Oral Once Alayasia Breeding, MD       pertuzumab  (PERJETA ) 840 mg in sodium chloride  0.9 % 250 mL chemo infusion  840 mg Intravenous Once Freddye Cardamone, MD       trastuzumab -anns (KANJINTI ) 693 mg in sodium chloride  0.9 % 250 mL  chemo infusion  8 mg/kg (Treatment Plan Recorded) Intravenous Once Breshay Ilg, MD        REVIEW OF SYSTEMS:   Constitutional: Denies fevers, chills or abnormal night sweats Eyes: Denies blurriness of vision, double vision or watery eyes Ears, nose, mouth, throat, and face: Denies mucositis or sore throat Respiratory: Denies cough, dyspnea or wheezes Cardiovascular: Denies palpitation, chest discomfort or lower extremity swelling Gastrointestinal:  Denies nausea, heartburn or change in bowel habits Skin: Denies abnormal skin rashes Lymphatics: Denies new lymphadenopathy or easy bruising Neurological:Denies numbness, tingling or new weaknesses Behavioral/Psych: Mood is stable, no new changes  Breast: Denies any palpable lumps or discharge All other systems were reviewed with the patient and are negative.  PHYSICAL EXAMINATION: ECOG PERFORMANCE STATUS: 0 - Asymptomatic  Vitals:   04/02/24 0954  BP: 105/62  Pulse: (!) 59  Resp: 15  Temp: 97.7 F (36.5 C)  SpO2: 99%    Filed Weights   04/02/24 0954  Weight: 194 lb 12.8 oz (88.4 kg)    GENERAL:alert, no distress and comfortable Left breast surgical lesion healing well. Some post op seroma noted.  LABORATORY DATA:  I have reviewed the data as listed Lab Results  Component Value Date   WBC 4.9 01/10/2024   HGB 12.1 01/10/2024   HCT 36.6 01/10/2024   MCV 85.5 01/10/2024   PLT 299 01/10/2024   Lab Results  Component Value Date   NA  141 01/10/2024   K 3.7 01/10/2024   CL 107 01/10/2024   CO2 26 01/10/2024    RADIOGRAPHIC STUDIES: I have personally reviewed the radiological reports and agreed with the findings in the report.  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 On Neoadj TCHP. Baseline ECHO with EF of 65-70%.  Assessment and Plan Assessment & Plan HER2-positive, ER/PR-positive breast cancer, post-lumpectomy, on adjuvant therapy Post-lumpectomy with complete pathologic response to chemotherapy. Residual DCIS present. Continued adjuvant therapy with HP maintenance. HER2-positive status necessitates Herceptin  and Perjeta . Radiation therapy recommended.  - Administer trastuzumab  and pertuzumab . - Adjust appointment length for future sessions.  Surveillance for cardiotoxicity due to adjuvant HER2-targeted therapy Ongoing surveillance for cardiotoxicity with regular echocardiograms every three months. - Order echocardiogram in a couple of weeks.  Anti estrogen therapy to follow adj radiation.      All questions were answered. The patient knows to call the clinic with any problems, questions or concerns.    Amber Stalls, MD 04/02/24

## 2024-04-02 NOTE — Patient Instructions (Signed)
 [facility] - A DEPT OF [facility]  Discharge Instructions: Thank you for choosing [facility] to provide your oncology and hematology care.   If you have a lab appointment with the Cancer Center, please go directly to the Cancer Center and check in at the registration area.   Wear comfortable clothing and clothing appropriate for easy access to any Portacath or PICC line.   We strive to give you quality time with your provider. You may need to reschedule your appointment if you arrive late (15 or more minutes).  Arriving late affects you and other patients whose appointments are after yours.  Also, if you miss three or more appointments without notifying the office, you may be dismissed from the clinic at the provider's discretion.      For prescription refill requests, have your pharmacy contact our office and allow 72 hours for refills to be completed.    Today you received the following chemotherapy and/or immunotherapy agents: trastuzumab  and pertuzumab       To help prevent nausea and vomiting after your treatment, we encourage you to take your nausea medication as directed.  BELOW ARE SYMPTOMS THAT SHOULD BE REPORTED IMMEDIATELY: *FEVER GREATER THAN 100.4 F (38 C) OR HIGHER *CHILLS OR SWEATING *NAUSEA AND VOMITING THAT IS NOT CONTROLLED WITH YOUR NAUSEA MEDICATION *UNUSUAL SHORTNESS OF BREATH *UNUSUAL BRUISING OR BLEEDING *URINARY PROBLEMS (pain or burning when urinating, or frequent urination) *BOWEL PROBLEMS (unusual diarrhea, constipation, pain near the anus) TENDERNESS IN MOUTH AND THROAT WITH OR WITHOUT PRESENCE OF ULCERS (sore throat, sores in mouth, or a toothache) UNUSUAL RASH, SWELLING OR PAIN  UNUSUAL VAGINAL DISCHARGE OR ITCHING   Items with * indicate a potential emergency and should be followed up as soon as possible or go to the Emergency Department if any problems should occur.  Please show the CHEMOTHERAPY ALERT CARD  or IMMUNOTHERAPY ALERT CARD at check-in to the Emergency Department and triage nurse.  Should you have questions after your visit or need to cancel or reschedule your appointment, please contact [facility] - A DEPT OF [facility]  Dept: [phone]  and follow the prompts.  Office hours are 8:00 a.m. to 4:30 p.m. Monday - Friday. Please note that voicemails left after 4:00 p.m. may not be returned until the following business day.  We are closed weekends and major holidays. You have access to a nurse at all times for urgent questions. Please call the main number to the clinic Dept: [phone] and follow the prompts.   For any non-urgent questions, you may also contact your provider using MyChart. We now offer e-Visits for anyone 30 and older to request care online for non-urgent symptoms. For details visit [email or web address].   Also download the MyChart app! Go to the app store, search "MyChart", open the app, select [facility], and log in with your MyChart username and password.

## 2024-04-05 NOTE — Progress Notes (Signed)
 Radiation Oncology         [phone]) [phone] ________________________________  Name: Martha [place] MRN: [id number]  Date: 04/06/2024  DOB: [birth year]  Follow-Up Visit Note  Outpatient  CC: Default, Provider, MD  Loretha Ash, MD  Diagnosis:   No diagnosis found.   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    She opted to pursue genetic testing on her [facility] consultation date of 08/24/23. Results showed no clinically significant variants detected by BRCA1/2 analyses with +RNAinsight analyses.  Since her breast clinic consultation date of 08/24/23 , she underwent the following imaging (dates and results as follows):  -- Bilateral breast MRI on 08/31/23 demonstrated: the biopsy proven malignancy in the upper-outer posterior left breast measuring 2.8 x 2.1 x 1.6 cm, and an additional suspicious 0.6 cm enhancing mass located superomedial to the dominant mass, likely corresponding to the additional sonographically identified 0.4 cm mass seen in the 2 o'clock left breast, located 7 cm from nipple.   Breast or nodal biopsies, since consultation, involved (dates and results as follows):  -- Based on MRI findings, a biopsy of the 2 o'clock left breast mass was accordingly obtained on 09/12/23. Pathology showed no evidence of malignancy with fibrocystic changes present.   Systemic therapy, if applicable, involved (dates and therapy as follows): -- Based on her Her2 positive status, she began neoadjuvant chemotherapy consisting of TCHP on 09/16/23 under Dr. Loretha. Chemo toxicities encountered by the patient throughout the course of therapy  included: nausea (managed with compazine ), diarrhea, and significant fatigue following neulasta . She also experienced itching and burning at her port site, which may have been exacerbated by alcohol use and dehydration. She otherwise tolerated chemotherapy relatively well and received her final cycle on 01/10/24.   A restaging MRI was also performed on 01/15/24 which demonstrated a positive response to neoadjuvant chemotherapy, characterized by near complete resolution of the abnormal enhancement and associated mass in the posterior, upper outer left breast. No new abnormalities were demonstrated overall.   She was then referred back to general surgery and opted to proceed with a left breast lumpectomy with SLN excisions on 03/07/24 under the care of Dr. Belinda. Pathology from the procedure revealed: residual intermediate grade DCIS measuring 1.5 mm in the greatest extent; negative for residual invasive carcinoma; all margins negative for in situ carcinoma; margin status to in situ disease of 3 mm from the anterior/inferior margin; nodal status of 2/2 left axillary SLN excisions negative for carcinoma.  ER status: 90% positive and PR status 90% positive, both with strong staining intensity; Her2 not assessed.   Given her initial Her2 positive status, Dr. Loretha will have her proceed with Herceptin  and Perjeta  x 1 year (q3 weeks); first dose given on 04/02/24. She will also return to Dr. Loretha following radiation therapy to discuss antiestrogen treatment options.   Of note: Other pertinent imaging performed in the interval includes a bone density scan on 03/05/24 that showed normal findings.   Symptomatically, the patient reports: ***        ALLERGIES:  is allergic to azithromycin, morphine and codeine, and oxycodone .  Meds: Current Outpatient Medications  Medication Sig Dispense  Refill   Ascorbic Acid (VITAMIN C) 1000 MG tablet Take 1,000 mg by mouth daily.     b complex vitamins capsule Take 1  capsule by mouth daily.     celecoxib (CELEBREX) 100 MG capsule Take 100 mg by mouth daily.     dipyridamole (PERSANTINE) 75 MG tablet Take 75 mg by mouth in the morning and at bedtime.     escitalopram  (LEXAPRO ) 10 MG tablet Take 1 tablet (10 mg total) by mouth daily. 90 tablet 0   Hydroxychloroquine Sulfate 100 MG TABS Take 100 mg by mouth in the morning and at bedtime.     metFORMIN (GLUCOPHAGE) 500 MG tablet Take 500 mg by mouth 2 (two) times daily with a meal.     simvastatin (ZOCOR) 20 MG tablet Take 20 mg by mouth daily at 6 PM.     traMADol  (ULTRAM ) 50 MG tablet Take 1-2 tablets (50-100 mg total) by mouth every 6 (six) hours as needed for moderate pain (pain score 4-6) or severe pain (pain score 7-10). 20 tablet 0   VITAMIN D PO Take by mouth.     ZINC CITRATE PO Take by mouth.     No current facility-administered medications for this encounter.    Physical Findings:  vitals were not taken for this visit. .     General: Alert and oriented, in no acute distress HEENT: Head is normocephalic. Extraocular movements are intact. Oropharynx is clear. Neck: Neck is supple, no palpable cervical or supraclavicular lymphadenopathy. Heart: Regular in rate and rhythm with no murmurs, rubs, or gallops. Chest: Clear to auscultation bilaterally, with no rhonchi, wheezes, or rales. Abdomen: Soft, nontender, nondistended, with no rigidity or guarding. Extremities: No cyanosis or edema. Lymphatics: see Neck Exam Musculoskeletal: symmetric strength and muscle tone throughout. Neurologic: No obvious focalities. Speech is fluent.  Psychiatric: Judgment and insight are intact. Affect is appropriate. Breast exam reveals ***  Lab Findings: Lab Results  Component Value Date   WBC 4.9 01/10/2024   HGB 12.1 01/10/2024   HCT 36.6 01/10/2024   MCV 85.5 01/10/2024   PLT 299 01/10/2024    @LASTCHEMISTRY @  Radiographic Findings: MM Breast Surgical Specimen Result Date: 03/07/2024 CLINICAL DATA:   Evaluate surgical specimen following lumpectomy for LEFT breast cancer. EXAM: SPECIMEN RADIOGRAPH OF THE LEFT BREAST COMPARISON:  Previous exam(s). FINDINGS: Status post excision of the LEFT breast. The radioactive seed and heart biopsy marker clip are present and intact. IMPRESSION: Specimen radiograph of the LEFT breast. Electronically Signed   By: Reyes Phi M.D.   On: 03/07/2024 12:52   NM Sentinel Node Inj-No Rpt (Breast) Result Date: 03/07/2024 Lymphoseek was injected by the Nuclear Medicine Technologist for sentinel lymph node localization.    Impression/Plan: We discussed adjuvant radiotherapy today.  I recommend *** in order to ***.  I reviewed the logistics, benefits, risks, and potential side effects of this treatment in detail. Risks may include but not necessary be limited to acute and late injury tissue in the radiation fields such as skin irritation (change in color/pigmentation, itching, dryness, pain, peeling). She may experience fatigue. We also discussed possible risk of long term cosmetic changes or scar tissue. There is also a smaller risk for lung toxicity, ***cardiac toxicity, ***brachial plexopathy, ***lymphedema, ***musculoskeletal changes, ***rib fragility or ***induction of a second malignancy, ***late chronic non-healing soft tissue wound.    The patient asked good questions which I answered to her satisfaction. She is enthusiastic about proceeding with treatment. A consent form has been ***  signed and placed in her chart.  A total of *** medically necessary complex treatment devices will be fabricated and supervised by me: *** fields with MLCs for custom blocks to protect heart, and lungs;  and, a Vac-lok. MORE COMPLEX DEVICES MAY BE MADE IN DOSIMETRY FOR FIELD IN FIELD BEAMS FOR DOSE HOMOGENEITY.  I have requested : 3D Simulation which is medically necessary to give adequate dose to at risk tissues while sparing lungs and heart.  I have requested a DVH of the following  structures: lungs, heart, *** lumpectomy cavity.    The patient will receive *** Gy in *** fractions to the *** with *** fields.  This will be *** followed by a boost.  On date of service, in total, I spent *** minutes on this encounter. Patient was seen in person.  _____________________________________   Lauraine Golden, MD  This document serves as a record of services personally performed by Lauraine Golden, MD. It was created on her behalf by Dorthy Fuse, a trained medical scribe. The creation of this record is based on the scribe's personal observations and the provider's statements to them. This document has been checked and approved by the attending provider.

## 2024-04-06 ENCOUNTER — Ambulatory Visit
Admission: RE | Admit: 2024-04-06 | Discharge: 2024-04-06 | Disposition: A | Discharge status: Home / Self Care | Source: Ambulatory Visit | Attending: Radiation Oncology | Admitting: Radiation Oncology

## 2024-04-06 ENCOUNTER — Encounter: Payer: Self-pay | Admitting: Radiation Oncology

## 2024-04-06 VITALS — BP 117/68 | HR 74 | Temp 97.0°F | Resp 18 | Ht 64.0 in | Wt 194.0 lb

## 2024-04-06 DIAGNOSIS — Z170 Estrogen receptor positive status [ER+]: Secondary | ICD-10-CM | POA: Insufficient documentation

## 2024-04-06 DIAGNOSIS — Z791 Long term (current) use of non-steroidal anti-inflammatories (NSAID): Secondary | ICD-10-CM | POA: Insufficient documentation

## 2024-04-06 DIAGNOSIS — Z1731 Human epidermal growth factor receptor 2 positive status: Secondary | ICD-10-CM | POA: Diagnosis not present

## 2024-04-06 DIAGNOSIS — Z1721 Progesterone receptor positive status: Secondary | ICD-10-CM | POA: Insufficient documentation

## 2024-04-06 DIAGNOSIS — C50412 Malignant neoplasm of upper-outer quadrant of left female breast: Secondary | ICD-10-CM | POA: Diagnosis present

## 2024-04-06 DIAGNOSIS — Z79899 Other long term (current) drug therapy: Secondary | ICD-10-CM | POA: Insufficient documentation

## 2024-04-06 DIAGNOSIS — Z7984 Long term (current) use of oral hypoglycemic drugs: Secondary | ICD-10-CM | POA: Diagnosis not present

## 2024-04-06 DIAGNOSIS — Z510 Encounter for antineoplastic radiation therapy: Secondary | ICD-10-CM | POA: Diagnosis not present

## 2024-04-11 DIAGNOSIS — Z510 Encounter for antineoplastic radiation therapy: Secondary | ICD-10-CM | POA: Insufficient documentation

## 2024-04-11 DIAGNOSIS — Z791 Long term (current) use of non-steroidal anti-inflammatories (NSAID): Secondary | ICD-10-CM | POA: Diagnosis not present

## 2024-04-11 DIAGNOSIS — E7800 Pure hypercholesterolemia, unspecified: Secondary | ICD-10-CM | POA: Diagnosis not present

## 2024-04-11 DIAGNOSIS — Z780 Asymptomatic menopausal state: Secondary | ICD-10-CM | POA: Diagnosis not present

## 2024-04-11 DIAGNOSIS — C50412 Malignant neoplasm of upper-outer quadrant of left female breast: Secondary | ICD-10-CM | POA: Diagnosis present

## 2024-04-11 DIAGNOSIS — Z1732 Human epidermal growth factor receptor 2 negative status: Secondary | ICD-10-CM | POA: Diagnosis not present

## 2024-04-11 DIAGNOSIS — R000 Tachycardia, unspecified: Secondary | ICD-10-CM | POA: Diagnosis not present

## 2024-04-11 DIAGNOSIS — Z79811 Long term (current) use of aromatase inhibitors: Secondary | ICD-10-CM | POA: Diagnosis not present

## 2024-04-11 DIAGNOSIS — I4891 Unspecified atrial fibrillation: Secondary | ICD-10-CM | POA: Diagnosis not present

## 2024-04-11 DIAGNOSIS — Z170 Estrogen receptor positive status [ER+]: Secondary | ICD-10-CM | POA: Insufficient documentation

## 2024-04-11 DIAGNOSIS — Z5111 Encounter for antineoplastic chemotherapy: Secondary | ICD-10-CM | POA: Diagnosis not present

## 2024-04-11 DIAGNOSIS — Z9221 Personal history of antineoplastic chemotherapy: Secondary | ICD-10-CM | POA: Diagnosis not present

## 2024-04-11 DIAGNOSIS — Z5112 Encounter for antineoplastic immunotherapy: Secondary | ICD-10-CM | POA: Diagnosis not present

## 2024-04-11 DIAGNOSIS — Z7984 Long term (current) use of oral hypoglycemic drugs: Secondary | ICD-10-CM | POA: Diagnosis not present

## 2024-04-11 DIAGNOSIS — Z79899 Other long term (current) drug therapy: Secondary | ICD-10-CM | POA: Diagnosis not present

## 2024-04-11 DIAGNOSIS — K219 Gastro-esophageal reflux disease without esophagitis: Secondary | ICD-10-CM | POA: Diagnosis not present

## 2024-04-11 DIAGNOSIS — R400 Somnolence: Secondary | ICD-10-CM | POA: Diagnosis not present

## 2024-04-11 DIAGNOSIS — R5383 Other fatigue: Secondary | ICD-10-CM | POA: Diagnosis not present

## 2024-04-13 ENCOUNTER — Encounter: Payer: Self-pay | Admitting: *Deleted

## 2024-04-18 ENCOUNTER — Ambulatory Visit
Admission: RE | Admit: 2024-04-18 | Discharge: 2024-04-18 | Disposition: A | Discharge status: Home / Self Care | Source: Ambulatory Visit | Attending: Radiation Oncology | Admitting: Radiation Oncology

## 2024-04-18 ENCOUNTER — Other Ambulatory Visit: Payer: Self-pay

## 2024-04-18 DIAGNOSIS — Z5112 Encounter for antineoplastic immunotherapy: Secondary | ICD-10-CM | POA: Diagnosis not present

## 2024-04-18 LAB — RAD ONC ARIA SESSION SUMMARY
Course Elapsed Days: 0
Plan Fractions Treated to Date: 1
Plan Prescribed Dose Per Fraction: 2.67 Gy
Plan Total Fractions Prescribed: 15
Plan Total Prescribed Dose: 40.05 Gy
Reference Point Dosage Given to Date: 2.67 Gy
Reference Point Session Dosage Given: 2.67 Gy
Session Number: 1

## 2024-04-19 ENCOUNTER — Ambulatory Visit (HOSPITAL_COMMUNITY)
Admission: RE | Admit: 2024-04-19 | Discharge: 2024-04-19 | Disposition: A | Discharge status: Home / Self Care | Source: Ambulatory Visit | Attending: Hematology and Oncology | Admitting: Hematology and Oncology

## 2024-04-19 ENCOUNTER — Other Ambulatory Visit: Payer: Self-pay

## 2024-04-19 ENCOUNTER — Ambulatory Visit
Admission: RE | Admit: 2024-04-19 | Discharge: 2024-04-19 | Disposition: A | Discharge status: Home / Self Care | Source: Ambulatory Visit | Attending: Radiation Oncology

## 2024-04-19 DIAGNOSIS — Z01818 Encounter for other preprocedural examination: Secondary | ICD-10-CM | POA: Diagnosis present

## 2024-04-19 DIAGNOSIS — C50412 Malignant neoplasm of upper-outer quadrant of left female breast: Secondary | ICD-10-CM | POA: Insufficient documentation

## 2024-04-19 DIAGNOSIS — Z0189 Encounter for other specified special examinations: Secondary | ICD-10-CM

## 2024-04-19 DIAGNOSIS — Z5112 Encounter for antineoplastic immunotherapy: Secondary | ICD-10-CM | POA: Diagnosis not present

## 2024-04-19 DIAGNOSIS — Z170 Estrogen receptor positive status [ER+]: Secondary | ICD-10-CM | POA: Diagnosis not present

## 2024-04-19 LAB — RAD ONC ARIA SESSION SUMMARY
Course Elapsed Days: 1
Plan Fractions Treated to Date: 2
Plan Prescribed Dose Per Fraction: 2.67 Gy
Plan Total Fractions Prescribed: 15
Plan Total Prescribed Dose: 40.05 Gy
Reference Point Dosage Given to Date: 5.34 Gy
Reference Point Session Dosage Given: 2.67 Gy
Session Number: 2

## 2024-04-19 LAB — ECHOCARDIOGRAM COMPLETE
AR max vel: 2.89 cm2
AV Area VTI: 2.84 cm2
AV Area mean vel: 2.83 cm2
AV Mean grad: 4 mmHg
AV Peak grad: 7.2 mmHg
Ao pk vel: 1.34 m/s
Area-P 1/2: 3.61 cm2
Calc EF: 60.4 %
MV VTI: 2.38 cm2
S' Lateral: 3 cm
Single Plane A2C EF: 61.7 %
Single Plane A4C EF: 60.6 %

## 2024-04-20 ENCOUNTER — Other Ambulatory Visit: Payer: Self-pay

## 2024-04-20 ENCOUNTER — Ambulatory Visit
Admission: RE | Admit: 2024-04-20 | Discharge: 2024-04-20 | Disposition: A | Discharge status: Home / Self Care | Source: Ambulatory Visit | Attending: Radiation Oncology

## 2024-04-20 DIAGNOSIS — Z5112 Encounter for antineoplastic immunotherapy: Secondary | ICD-10-CM | POA: Diagnosis not present

## 2024-04-20 LAB — RAD ONC ARIA SESSION SUMMARY
Course Elapsed Days: 2
Plan Fractions Treated to Date: 3
Plan Prescribed Dose Per Fraction: 2.67 Gy
Plan Total Fractions Prescribed: 15
Plan Total Prescribed Dose: 40.05 Gy
Reference Point Dosage Given to Date: 8.01 Gy
Reference Point Session Dosage Given: 2.67 Gy
Session Number: 3

## 2024-04-23 ENCOUNTER — Other Ambulatory Visit: Payer: Self-pay

## 2024-04-23 ENCOUNTER — Inpatient Hospital Stay

## 2024-04-23 ENCOUNTER — Ambulatory Visit
Admission: RE | Admit: 2024-04-23 | Discharge: 2024-04-23 | Disposition: A | Discharge status: Home / Self Care | Source: Ambulatory Visit | Attending: Radiation Oncology | Admitting: Radiation Oncology

## 2024-04-23 ENCOUNTER — Inpatient Hospital Stay: Attending: Hematology and Oncology | Admitting: Hematology and Oncology

## 2024-04-23 VITALS — BP 120/60 | HR 78 | Temp 97.9°F | Resp 16 | Wt 193.7 lb

## 2024-04-23 DIAGNOSIS — R000 Tachycardia, unspecified: Secondary | ICD-10-CM | POA: Insufficient documentation

## 2024-04-23 DIAGNOSIS — Z5112 Encounter for antineoplastic immunotherapy: Secondary | ICD-10-CM | POA: Insufficient documentation

## 2024-04-23 DIAGNOSIS — E7800 Pure hypercholesterolemia, unspecified: Secondary | ICD-10-CM | POA: Insufficient documentation

## 2024-04-23 DIAGNOSIS — Z170 Estrogen receptor positive status [ER+]: Secondary | ICD-10-CM | POA: Diagnosis not present

## 2024-04-23 DIAGNOSIS — R400 Somnolence: Secondary | ICD-10-CM | POA: Insufficient documentation

## 2024-04-23 DIAGNOSIS — Z5111 Encounter for antineoplastic chemotherapy: Secondary | ICD-10-CM | POA: Insufficient documentation

## 2024-04-23 DIAGNOSIS — Z9221 Personal history of antineoplastic chemotherapy: Secondary | ICD-10-CM | POA: Insufficient documentation

## 2024-04-23 DIAGNOSIS — Z1732 Human epidermal growth factor receptor 2 negative status: Secondary | ICD-10-CM | POA: Insufficient documentation

## 2024-04-23 DIAGNOSIS — C50412 Malignant neoplasm of upper-outer quadrant of left female breast: Secondary | ICD-10-CM | POA: Insufficient documentation

## 2024-04-23 DIAGNOSIS — Z79899 Other long term (current) drug therapy: Secondary | ICD-10-CM | POA: Insufficient documentation

## 2024-04-23 DIAGNOSIS — Z510 Encounter for antineoplastic radiation therapy: Secondary | ICD-10-CM | POA: Insufficient documentation

## 2024-04-23 DIAGNOSIS — R5383 Other fatigue: Secondary | ICD-10-CM | POA: Insufficient documentation

## 2024-04-23 DIAGNOSIS — Z7984 Long term (current) use of oral hypoglycemic drugs: Secondary | ICD-10-CM | POA: Insufficient documentation

## 2024-04-23 DIAGNOSIS — Z780 Asymptomatic menopausal state: Secondary | ICD-10-CM | POA: Insufficient documentation

## 2024-04-23 DIAGNOSIS — Z791 Long term (current) use of non-steroidal anti-inflammatories (NSAID): Secondary | ICD-10-CM | POA: Insufficient documentation

## 2024-04-23 DIAGNOSIS — I4891 Unspecified atrial fibrillation: Secondary | ICD-10-CM | POA: Insufficient documentation

## 2024-04-23 DIAGNOSIS — Z79811 Long term (current) use of aromatase inhibitors: Secondary | ICD-10-CM | POA: Insufficient documentation

## 2024-04-23 DIAGNOSIS — K219 Gastro-esophageal reflux disease without esophagitis: Secondary | ICD-10-CM | POA: Insufficient documentation

## 2024-04-23 DIAGNOSIS — E860 Dehydration: Secondary | ICD-10-CM

## 2024-04-23 LAB — RAD ONC ARIA SESSION SUMMARY
Course Elapsed Days: 5
Plan Fractions Treated to Date: 4
Plan Prescribed Dose Per Fraction: 2.67 Gy
Plan Total Fractions Prescribed: 15
Plan Total Prescribed Dose: 40.05 Gy
Reference Point Dosage Given to Date: 10.68 Gy
Reference Point Session Dosage Given: 2.67 Gy
Session Number: 4

## 2024-04-23 MED ORDER — ACETAMINOPHEN 325 MG PO TABS
650.0000 mg | ORAL_TABLET | Freq: Once | ORAL | Status: AC
  Administered 2024-04-23: 650 mg via ORAL
  Filled 2024-04-23: qty 2

## 2024-04-23 MED ORDER — TRASTUZUMAB-ANNS CHEMO 150 MG IV SOLR
6.0000 mg/kg | Freq: Once | INTRAVENOUS | Status: AC
  Administered 2024-04-23: 525 mg via INTRAVENOUS
  Filled 2024-04-23: qty 25

## 2024-04-23 MED ORDER — SODIUM CHLORIDE 0.9 % IV SOLN: INTRAVENOUS | Status: DC

## 2024-04-23 MED ORDER — SODIUM CHLORIDE 0.9 % IV SOLN
420.0000 mg | Freq: Once | INTRAVENOUS | Status: AC
  Administered 2024-04-23: 420 mg via INTRAVENOUS
  Filled 2024-04-23: qty 14

## 2024-04-23 NOTE — Assessment & Plan Note (Signed)
 Invasive Ductal Carcinoma, Left Breast Newly diagnosed, HER2 positive, estrogen receptor positive, with a primary mass measuring 2.1 cm and a satellite nodule of 4 mm. On Neoadj TCHP. Baseline ECHO with EF of 65-70%.  Assessment and Plan Assessment & Plan HER2-positive, ER/PR-positive breast cancer, post-lumpectomy, on adjuvant therapy Post-lumpectomy with complete pathologic response to chemotherapy. Residual DCIS present. Continued adjuvant therapy with HP maintenance.

## 2024-04-23 NOTE — Patient Instructions (Signed)
 [facility] - A DEPT OF [facility]  Discharge Instructions: Thank you for choosing [facility] to provide your oncology and hematology care.   If you have a lab appointment with the Cancer Center, please go directly to the Cancer Center and check in at the registration area.   Wear comfortable clothing and clothing appropriate for easy access to any Portacath or PICC line.   We strive to give you quality time with your provider. You may need to reschedule your appointment if you arrive late (15 or more minutes).  Arriving late affects you and other patients whose appointments are after yours.  Also, if you miss three or more appointments without notifying the office, you may be dismissed from the clinic at the provider's discretion.      For prescription refill requests, have your pharmacy contact our office and allow 72 hours for refills to be completed.    Today you received the following chemotherapy and/or immunotherapy agents: trastuzumab  and pertuzumab       To help prevent nausea and vomiting after your treatment, we encourage you to take your nausea medication as directed.  BELOW ARE SYMPTOMS THAT SHOULD BE REPORTED IMMEDIATELY: *FEVER GREATER THAN 100.4 F (38 C) OR HIGHER *CHILLS OR SWEATING *NAUSEA AND VOMITING THAT IS NOT CONTROLLED WITH YOUR NAUSEA MEDICATION *UNUSUAL SHORTNESS OF BREATH *UNUSUAL BRUISING OR BLEEDING *URINARY PROBLEMS (pain or burning when urinating, or frequent urination) *BOWEL PROBLEMS (unusual diarrhea, constipation, pain near the anus) TENDERNESS IN MOUTH AND THROAT WITH OR WITHOUT PRESENCE OF ULCERS (sore throat, sores in mouth, or a toothache) UNUSUAL RASH, SWELLING OR PAIN  UNUSUAL VAGINAL DISCHARGE OR ITCHING   Items with * indicate a potential emergency and should be followed up as soon as possible or go to the Emergency Department if any problems should occur.  Please show the CHEMOTHERAPY ALERT CARD  or IMMUNOTHERAPY ALERT CARD at check-in to the Emergency Department and triage nurse.  Should you have questions after your visit or need to cancel or reschedule your appointment, please contact [facility] - A DEPT OF [facility]  Dept: [phone]  and follow the prompts.  Office hours are 8:00 a.m. to 4:30 p.m. Monday - Friday. Please note that voicemails left after 4:00 p.m. may not be returned until the following business day.  We are closed weekends and major holidays. You have access to a nurse at all times for urgent questions. Please call the main number to the clinic Dept: [phone] and follow the prompts.   For any non-urgent questions, you may also contact your provider using MyChart. We now offer e-Visits for anyone 30 and older to request care online for non-urgent symptoms. For details visit [email or web address].   Also download the MyChart app! Go to the app store, search "MyChart", open the app, select [facility], and log in with your MyChart username and password.

## 2024-04-23 NOTE — Progress Notes (Signed)
 Martha [place] CONSULT NOTE  Patient Care Team: [facility] [place] as PCP - General (Family Medicine) Sheena Pugh, DO as PCP - Cardiology (Cardiology) Tyree Nanetta SAILOR, RN as Oncology Nurse Navigator Loretha Ash, MD as Consulting Physician (Hematology and Oncology) Belinda Cough, MD as Consulting Physician (General Surgery) Izell Domino, MD as Attending Physician (Radiation Oncology)  CHIEF COMPLAINTS/PURPOSE OF CONSULTATION:  Newly diagnosed breast cancer  HISTORY OF PRESENTING ILLNESS:  Martha [place] 58 y.o. female is here because of recent diagnosis of left breast cancer  I reviewed her records extensively and collaborated the history with the patient.  SUMMARY OF ONCOLOGIC HISTORY: Oncology History  Malignant neoplasm of upper-outer quadrant of left breast in female, estrogen receptor positive (HCC)  08/04/2023 Mammogram   Further evaluation is suggested for calcifications in the left breast.  There is a group of coarse heterogeneous calcifications associated with an asymmetry in the UPPER OUTER QUADRANT at posterior depth spanning approximately 3.2 x 2.2 x 2.9 cm (AP x transverse x CC). There are linear and branching forms within the large group. There is a possible mass at the posterior margin of the calcifications measuring just under 1 cm in size.   Targeted ultrasound is performed, demonstrating an irregular hypoechoic mass with angular margins and associated calcifications at 2 o'clock 9 cm from the nipple measuring approximately 2.1 x 1.6 x 1.6 cm, demonstrating posterior acoustic shadowing and demonstrating internal power Doppler flow. There is a 0.4 x 0.3 x 0.2 cm satellite mass at 2 o'clock 7 cm from the nipple which is approximately 2 cm removed from the dominant mass.     08/16/2023 Pathology Results     08/22/2023 Initial Diagnosis   Malignant neoplasm of upper-outer quadrant of left breast in female, estrogen  receptor positive (HCC)   08/24/2023 Cancer Staging   Staging form: Breast, AJCC 8th Edition - Clinical stage from 08/24/2023: Stage IB (cT2, cN0, cM0, G2, ER+, PR+, HER2+) - Signed by Loretha Ash, MD on 08/24/2023 Stage prefix: Initial diagnosis Histologic grading system: 3 grade system Laterality: Left Staged by: Pathologist and managing physician Stage used in treatment planning: Yes National guidelines used in treatment planning: Yes Type of national guideline used in treatment planning: NCCN   08/31/2023 Genetic Testing   Negative [facility] CancerNext+RNAinsight Panel.  Report date is 08/31/2023.   The [facility] CancerNext+RNAinsight Panel includes sequencing, rearrangement analysis, and RNA analysis for the following 39 genes: APC, ATM, BAP1, BARD1, BMPR1A, BRCA1, BRCA2, BRIP1, CDH1, CDKN2A, CHEK2, FH, FLCN, MET, MLH1, MSH2, MSH6, MUTYH, NF1, NTHL1, PALB2, PMS2, PTEN, RAD51C, RAD51D, SMAD4, STK11, TP53, TSC1, TSC2, and VHL (sequencing and deletion/duplication); AXIN2, HOXB13, MBD4, MSH3, POLD1 and POLE (sequencing only); EPCAM and GREM1 (deletion/duplication only).    09/16/2023 - 01/13/2024 Chemotherapy   Patient is on Treatment Plan : BREAST  Docetaxel  + Carboplatin  + Trastuzumab  + Pertuzumab   (TCHP) q21d      04/02/2024 -  Chemotherapy   Patient is on Treatment Plan : BREAST Trastuzumab  + Pertuzumab  q21d x 11 cycles      Discussed the use of AI scribe software for clinical note transcription with the patient, who gave verbal consent to proceed. History of Present Illness   History of Present Illness  Martha [place] is a 58 year old female with triple positive breast cancer who presents for ongoing treatment and management.  She is currently undergoing treatment for triple positive breast cancer, diagnosed in January, characterized by estrogen receptor positive, progesterone receptor positive,  and HER2 positive status. She has completed chemotherapy and is now receiving HER2-directed  antibodies, specifically Herceptin  and pertuzumab . Radiation therapy began recently, with sessions on Wednesday, Thursday, and Friday of last week.  She experiences fatigue, which she attributes to a late night due to family issues, having gone to bed around 1 AM. Emotional stress is also present due to her daughter's family situation, involving Vernell moving in after a family conflict.   She previously experienced drowsiness from Benadryl , administered to prevent reactions during treatment, and requested its removal from her regimen. She brought Claritin as an alternative, although it also causes drowsiness.  Her social history is significant for her daughter Vernell and Rachel's 55-month-old child living with her following the death of Rachel's husband, Juliene, ten weeks ago. The family is dealing with the emotional aftermath of this loss, and she is actively involved in supporting her daughter and grandchild.   Rest of the pertinent 10 point ROS reviewed and neg.  MEDICAL HISTORY:  Past Medical History:  Diagnosis Date   Atrial fibrillation with RVR (HCC)    GERD (gastroesophageal reflux disease)    no meds   High cholesterol    Menorrhagia 07/13/2013   Missed abortion    resolved - no surgery required   New onset atrial fibrillation (HCC) 08/04/2017   with RVR to the 160s; S/P hammertoe OR/notes 08/04/2017   S/P bunionectomy 08/04/2017   SVD (spontaneous vaginal delivery) X 2   Tachycardia 08/04/2017    SURGICAL HISTORY: Past Surgical History:  Procedure Laterality Date   ABDOMINAL HYSTERECTOMY     AXILLARY SENTINEL NODE BIOPSY Left 03/07/2024   Procedure: BIOPSY, LYMPH NODE, SENTINEL, AXILLARY;  Surgeon: Belinda Cough, MD;  Location: [facility];  Service: General;  Laterality: Left;  LEFT BREAST RADIOACTIVE SEED LOCALIZED LUMPECTOMY LEFT AXILLARY DEEP SENTINEL LYMPH NODE BIOPSY LMA w/PEC BLOCK   BACK SURGERY     BREAST BIOPSY Left 08/16/2023   US  LT BREAST BX W LOC  DEV 1ST LESION IMG BX SPEC US  GUIDE 08/16/2023 [facility]MOGRAP[facility]   BREAST BIOPSY Left 09/12/2023   US  LT BREAST BX W LOC DEV 1ST LESION IMG BX SPEC US  GUIDE 09/12/2023 [facility] MAMMOGRAP[facility]   BREAST BIOPSY  03/06/2024   MM LT RADIOACTIVE SEED LOC MAMMO GUIDE 03/06/2024 [facility] MAMMOGRAPHY   BREAST LUMPECTOMY WITH RADIOACTIVE SEED LOCALIZATION Left 03/07/2024   Procedure: BREAST LUMPECTOMY WITH RADIOACTIVE SEED LOCALIZATION;  Surgeon: Belinda Cough, MD;  Location: [facility];  Service: General;  Laterality: Left;   BUNIONECTOMY WITH HAMMERTOE RECONSTRUCTION Right 03/2017   BUNIONECTOMY WITH HAMMERTOE RECONSTRUCTION Left 08/04/2017   BUNIONECTOMY WITH HAMMERTOE RECONSTRUCTION Left 08/04/2017   Procedure: Left modified McBride and Lapidus bunion corrections; Left 2-3 Weil and hammertoe corrections;  Surgeon: Kit Rush, MD;  Location: [facility];  Service: Orthopedics;  Laterality: Left;   PORTACATH PLACEMENT Right 09/15/2023   Procedure: INSERTION PORT-A-CATH WITH ULTASOUND GUIDANCE;  Surgeon: Belinda Cough, MD;  Location: [facility];  Service: General;  Laterality: Right;  Leave port accessed   ROBOTIC ASSISTED TOTAL HYSTERECTOMY WITH BILATERAL SALPINGO OOPHERECTOMY N/A 07/13/2013   Procedure: ROBOTIC ASSISTED TOTAL HYSTERECTOMY WITH BILATERAL SALPINGECTOMY;  Surgeon: Nena DELENA App, MD;  Location: [facility];  Service: Gynecology;  Laterality: N/A;   SHOULDER ARTHROSCOPY W/ ROTATOR CUFF REPAIR Right 2010   SHOULDER ARTHROSCOPY WITH ROTATOR CUFF REPAIR Right 10/17/2014   Procedure: SHOULDER ARTHROSCOPY WITH ROTATOR CUFF REPAIR;  Surgeon: Toribio Chancy, MD;  Location:   [facility] [place];  Service: Orthopedics;  Laterality: Right;   TUBAL LIGATION     WEIL OSTEOTOMY Left 08/04/2017   Procedure: Left 2nd and 3rd weil osteotomy;  Surgeon: Kit Rush, MD;  Location: [facility];  Service: Orthopedics;  Laterality: Left;   WISDOM TOOTH EXTRACTION       SOCIAL HISTORY: Social History   Socioeconomic History   Marital status: Married    Spouse name: Merchant navy officer   Number of children: Not on file   Years of education: Not on file   Highest education level: Not on file  Occupational History   Not on file  Tobacco Use   Smoking status: Never   Smokeless tobacco: Never  Vaping Use   Vaping status: Never Used  Substance and Sexual Activity   Alcohol use: No   Drug use: No   Sexual activity: Not on file  Other Topics Concern   Not on file  Social History Narrative   Lives with husband   Right Handed   Drinks 1 cup caffeine daily   Social Drivers of Health   Financial Resource Strain: Not on file  Food Insecurity: No Food Insecurity (04/06/2024)   Hunger Vital Sign    Worried About Running Out of Food in the Last Year: Never true    Ran Out of Food in the Last Year: Never true  Transportation Needs: No Transportation Needs (04/06/2024)   PRAPARE - Administrator, Civil Service (Medical): No    Lack of Transportation (Non-Medical): No  Physical Activity: Not on file  Stress: Not on file  Social Connections: Unknown (12/22/2021)   Received from [facility]   Social Network    Social Network: Not on file  Intimate Partner Violence: Not At Risk (04/06/2024)   Humiliation, Afraid, Rape, and Kick questionnaire    Fear of Current or Ex-Partner: No    Emotionally Abused: No    Physically Abused: No    Sexually Abused: No    FAMILY HISTORY: Family History  Problem Relation Age of Onset   Hypertension Mother    Hypertension Father    Bone cancer Brother 72       or other primary?    ALLERGIES:  is allergic to azithromycin, morphine and codeine, and oxycodone .  MEDICATIONS:  Current Outpatient Medications  Medication Sig Dispense Refill   celecoxib (CELEBREX) 100 MG capsule Take 100 mg by mouth daily.     dipyridamole (PERSANTINE) 75 MG tablet Take 75 mg by mouth in the morning and at bedtime.     doxycycline  (MONODOX) 100 MG capsule Take 100 mg by mouth 2 (two) times daily.     escitalopram  (LEXAPRO ) 10 MG tablet Take 1 tablet (10 mg total) by mouth daily. 90 tablet 0   Hydroxychloroquine Sulfate 100 MG TABS Take 100 mg by mouth in the morning and at bedtime.     lidocaine -prilocaine  (EMLA ) cream Apply 1 Application topically once.     metFORMIN (GLUCOPHAGE) 500 MG tablet Take 500 mg by mouth 2 (two) times daily with a meal.     VITAMIN D PO Take by mouth.     simvastatin (ZOCOR) 20 MG tablet Take 20 mg by mouth daily at 6 PM.     traMADol  (ULTRAM ) 50 MG tablet Take 1-2 tablets (50-100 mg total) by mouth every 6 (six) hours as needed for moderate pain (pain score 4-6) or severe pain (pain score 7-10). 20 tablet 0   No current facility-administered medications for  this visit.    REVIEW OF SYSTEMS:   Constitutional: Denies fevers, chills or abnormal night sweats Eyes: Denies blurriness of vision, double vision or watery eyes Ears, nose, mouth, throat, and face: Denies mucositis or sore throat Respiratory: Denies cough, dyspnea or wheezes Cardiovascular: Denies palpitation, chest discomfort or lower extremity swelling Gastrointestinal:  Denies nausea, heartburn or change in bowel habits Skin: Denies abnormal skin rashes Lymphatics: Denies new lymphadenopathy or easy bruising Neurological:Denies numbness, tingling or new weaknesses Behavioral/Psych: Mood is stable, no new changes  Breast: Denies any palpable lumps or discharge All other systems were reviewed with the patient and are negative.  PHYSICAL EXAMINATION: ECOG PERFORMANCE STATUS: 0 - Asymptomatic  Vitals:   04/23/24 1003  BP: 120/60  Pulse: 78  Resp: 16  Temp: 97.9 F (36.6 C)  SpO2: 97%    Filed Weights   04/23/24 1003  Weight: 193 lb 11.2 oz (87.9 kg)    GENERAL:alert, no distress and comfortable   LABORATORY DATA:  I have reviewed the data as listed Lab Results  Component Value Date   WBC 4.9 01/10/2024   HGB  12.1 01/10/2024   HCT 36.6 01/10/2024   MCV 85.5 01/10/2024   PLT 299 01/10/2024   Lab Results  Component Value Date   NA 141 01/10/2024   K 3.7 01/10/2024   CL 107 01/10/2024   CO2 26 01/10/2024    RADIOGRAPHIC STUDIES: I have personally reviewed the radiological reports and agreed with the findings in the report.  ASSESSMENT AND PLAN:   Assessment and Plan Assessment & Plan Triple positive left breast cancer, post-surgical, on adjuvant therapy Triple positive breast cancer with ER, PR, and HER2 positivity. Post-surgical status with ongoing adjuvant therapy. Postmenopausal status supports anastrozole use. - Continue HER2-directed antibodies (Herceptin  and pertuzumab ). - Complete radiation therapy by October 7th. - Initiate anastrozole therapy post-radiation, starting in the last week of October. - Monitor bone density long-term due to aromatase inhibitor risk. - Remove Benadryl  from premedication; continue Tylenol . - Baseline bone density normal.  Time spent: 20 min  All questions were answered. The patient knows to call the clinic with any problems, questions or concerns.    Amber Stalls, MD 04/23/24

## 2024-04-24 ENCOUNTER — Ambulatory Visit

## 2024-04-24 DIAGNOSIS — Z5112 Encounter for antineoplastic immunotherapy: Secondary | ICD-10-CM | POA: Diagnosis not present

## 2024-04-25 ENCOUNTER — Other Ambulatory Visit: Payer: Self-pay

## 2024-04-25 ENCOUNTER — Ambulatory Visit
Admission: RE | Admit: 2024-04-25 | Discharge: 2024-04-25 | Disposition: A | Discharge status: Home / Self Care | Source: Ambulatory Visit | Attending: Radiation Oncology | Admitting: Radiation Oncology

## 2024-04-25 DIAGNOSIS — Z5112 Encounter for antineoplastic immunotherapy: Secondary | ICD-10-CM | POA: Diagnosis not present

## 2024-04-25 LAB — RAD ONC ARIA SESSION SUMMARY
Course Elapsed Days: 7
Plan Fractions Treated to Date: 5
Plan Prescribed Dose Per Fraction: 2.67 Gy
Plan Total Fractions Prescribed: 15
Plan Total Prescribed Dose: 40.05 Gy
Reference Point Dosage Given to Date: 13.35 Gy
Reference Point Session Dosage Given: 2.67 Gy
Session Number: 5

## 2024-04-26 ENCOUNTER — Other Ambulatory Visit: Payer: Self-pay

## 2024-04-26 ENCOUNTER — Ambulatory Visit
Admission: RE | Admit: 2024-04-26 | Discharge: 2024-04-26 | Disposition: A | Discharge status: Home / Self Care | Source: Ambulatory Visit | Attending: Radiation Oncology | Admitting: Radiation Oncology

## 2024-04-26 DIAGNOSIS — Z5112 Encounter for antineoplastic immunotherapy: Secondary | ICD-10-CM | POA: Diagnosis not present

## 2024-04-26 LAB — RAD ONC ARIA SESSION SUMMARY
Course Elapsed Days: 8
Plan Fractions Treated to Date: 6
Plan Prescribed Dose Per Fraction: 2.67 Gy
Plan Total Fractions Prescribed: 15
Plan Total Prescribed Dose: 40.05 Gy
Reference Point Dosage Given to Date: 16.02 Gy
Reference Point Session Dosage Given: 2.67 Gy
Session Number: 6

## 2024-04-27 ENCOUNTER — Other Ambulatory Visit: Payer: Self-pay

## 2024-04-27 ENCOUNTER — Ambulatory Visit
Admission: RE | Admit: 2024-04-27 | Discharge: 2024-04-27 | Disposition: A | Discharge status: Home / Self Care | Source: Ambulatory Visit | Attending: Radiation Oncology | Admitting: Radiation Oncology

## 2024-04-27 DIAGNOSIS — Z5112 Encounter for antineoplastic immunotherapy: Secondary | ICD-10-CM | POA: Diagnosis not present

## 2024-04-27 LAB — RAD ONC ARIA SESSION SUMMARY
Course Elapsed Days: 9
Plan Fractions Treated to Date: 7
Plan Prescribed Dose Per Fraction: 2.67 Gy
Plan Total Fractions Prescribed: 15
Plan Total Prescribed Dose: 40.05 Gy
Reference Point Dosage Given to Date: 18.69 Gy
Reference Point Session Dosage Given: 2.67 Gy
Session Number: 7

## 2024-04-30 ENCOUNTER — Ambulatory Visit
Admission: RE | Admit: 2024-04-30 | Discharge: 2024-04-30 | Disposition: A | Discharge status: Home / Self Care | Source: Ambulatory Visit | Attending: Radiation Oncology

## 2024-04-30 ENCOUNTER — Other Ambulatory Visit: Payer: Self-pay

## 2024-04-30 ENCOUNTER — Ambulatory Visit: Admitting: Radiation Oncology

## 2024-04-30 DIAGNOSIS — Z5112 Encounter for antineoplastic immunotherapy: Secondary | ICD-10-CM | POA: Diagnosis not present

## 2024-04-30 LAB — RAD ONC ARIA SESSION SUMMARY
Course Elapsed Days: 12
Plan Fractions Treated to Date: 8
Plan Prescribed Dose Per Fraction: 2.67 Gy
Plan Total Fractions Prescribed: 15
Plan Total Prescribed Dose: 40.05 Gy
Reference Point Dosage Given to Date: 21.36 Gy
Reference Point Session Dosage Given: 2.67 Gy
Session Number: 8

## 2024-05-01 ENCOUNTER — Other Ambulatory Visit: Payer: Self-pay

## 2024-05-01 ENCOUNTER — Ambulatory Visit
Admission: RE | Admit: 2024-05-01 | Discharge: 2024-05-01 | Disposition: A | Discharge status: Home / Self Care | Source: Ambulatory Visit | Attending: Radiation Oncology

## 2024-05-01 DIAGNOSIS — Z5112 Encounter for antineoplastic immunotherapy: Secondary | ICD-10-CM | POA: Diagnosis not present

## 2024-05-01 LAB — RAD ONC ARIA SESSION SUMMARY
Course Elapsed Days: 13
Plan Fractions Treated to Date: 9
Plan Prescribed Dose Per Fraction: 2.67 Gy
Plan Total Fractions Prescribed: 15
Plan Total Prescribed Dose: 40.05 Gy
Reference Point Dosage Given to Date: 24.03 Gy
Reference Point Session Dosage Given: 2.67 Gy
Session Number: 9

## 2024-05-02 ENCOUNTER — Ambulatory Visit
Admission: RE | Admit: 2024-05-02 | Discharge: 2024-05-02 | Disposition: A | Discharge status: Home / Self Care | Source: Ambulatory Visit | Attending: Radiation Oncology | Admitting: Radiation Oncology

## 2024-05-02 ENCOUNTER — Other Ambulatory Visit: Payer: Self-pay

## 2024-05-02 DIAGNOSIS — Z5112 Encounter for antineoplastic immunotherapy: Secondary | ICD-10-CM | POA: Diagnosis not present

## 2024-05-02 LAB — RAD ONC ARIA SESSION SUMMARY
Course Elapsed Days: 14
Plan Fractions Treated to Date: 10
Plan Prescribed Dose Per Fraction: 2.67 Gy
Plan Total Fractions Prescribed: 15
Plan Total Prescribed Dose: 40.05 Gy
Reference Point Dosage Given to Date: 26.7 Gy
Reference Point Session Dosage Given: 2.67 Gy
Session Number: 10

## 2024-05-03 ENCOUNTER — Ambulatory Visit

## 2024-05-04 ENCOUNTER — Ambulatory Visit

## 2024-05-07 ENCOUNTER — Other Ambulatory Visit: Payer: Self-pay

## 2024-05-07 ENCOUNTER — Ambulatory Visit
Admission: RE | Admit: 2024-05-07 | Discharge: 2024-05-07 | Disposition: A | Source: Ambulatory Visit | Attending: Radiation Oncology

## 2024-05-07 DIAGNOSIS — Z5112 Encounter for antineoplastic immunotherapy: Secondary | ICD-10-CM | POA: Diagnosis not present

## 2024-05-07 LAB — RAD ONC ARIA SESSION SUMMARY
Course Elapsed Days: 19
Plan Fractions Treated to Date: 11
Plan Prescribed Dose Per Fraction: 2.67 Gy
Plan Total Fractions Prescribed: 15
Plan Total Prescribed Dose: 40.05 Gy
Reference Point Dosage Given to Date: 29.37 Gy
Reference Point Session Dosage Given: 2.67 Gy
Session Number: 11

## 2024-05-08 ENCOUNTER — Other Ambulatory Visit: Payer: Self-pay

## 2024-05-08 ENCOUNTER — Ambulatory Visit
Admission: RE | Admit: 2024-05-08 | Discharge: 2024-05-08 | Disposition: A | Discharge status: Home / Self Care | Source: Ambulatory Visit | Attending: Radiation Oncology | Admitting: Radiation Oncology

## 2024-05-08 DIAGNOSIS — Z5112 Encounter for antineoplastic immunotherapy: Secondary | ICD-10-CM | POA: Diagnosis not present

## 2024-05-08 LAB — RAD ONC ARIA SESSION SUMMARY
Course Elapsed Days: 20
Plan Fractions Treated to Date: 12
Plan Prescribed Dose Per Fraction: 2.67 Gy
Plan Total Fractions Prescribed: 15
Plan Total Prescribed Dose: 40.05 Gy
Reference Point Dosage Given to Date: 32.04 Gy
Reference Point Session Dosage Given: 2.67 Gy
Session Number: 12

## 2024-05-09 ENCOUNTER — Ambulatory Visit
Admission: RE | Admit: 2024-05-09 | Discharge: 2024-05-09 | Disposition: A | Discharge status: Home / Self Care | Source: Ambulatory Visit | Attending: Radiation Oncology | Admitting: Radiation Oncology

## 2024-05-09 ENCOUNTER — Ambulatory Visit

## 2024-05-09 ENCOUNTER — Other Ambulatory Visit: Payer: Self-pay

## 2024-05-09 DIAGNOSIS — K219 Gastro-esophageal reflux disease without esophagitis: Secondary | ICD-10-CM | POA: Diagnosis not present

## 2024-05-09 DIAGNOSIS — Z170 Estrogen receptor positive status [ER+]: Secondary | ICD-10-CM | POA: Insufficient documentation

## 2024-05-09 DIAGNOSIS — C50412 Malignant neoplasm of upper-outer quadrant of left female breast: Secondary | ICD-10-CM | POA: Diagnosis present

## 2024-05-09 DIAGNOSIS — I4891 Unspecified atrial fibrillation: Secondary | ICD-10-CM | POA: Diagnosis not present

## 2024-05-09 DIAGNOSIS — Z7984 Long term (current) use of oral hypoglycemic drugs: Secondary | ICD-10-CM | POA: Diagnosis not present

## 2024-05-09 DIAGNOSIS — Z5112 Encounter for antineoplastic immunotherapy: Secondary | ICD-10-CM | POA: Diagnosis not present

## 2024-05-09 DIAGNOSIS — Z79899 Other long term (current) drug therapy: Secondary | ICD-10-CM | POA: Diagnosis not present

## 2024-05-09 DIAGNOSIS — Z510 Encounter for antineoplastic radiation therapy: Secondary | ICD-10-CM | POA: Insufficient documentation

## 2024-05-09 DIAGNOSIS — N920 Excessive and frequent menstruation with regular cycle: Secondary | ICD-10-CM | POA: Diagnosis not present

## 2024-05-09 DIAGNOSIS — Z791 Long term (current) use of non-steroidal anti-inflammatories (NSAID): Secondary | ICD-10-CM | POA: Diagnosis not present

## 2024-05-09 DIAGNOSIS — E7800 Pure hypercholesterolemia, unspecified: Secondary | ICD-10-CM | POA: Diagnosis not present

## 2024-05-09 DIAGNOSIS — Z9221 Personal history of antineoplastic chemotherapy: Secondary | ICD-10-CM | POA: Diagnosis not present

## 2024-05-09 DIAGNOSIS — Z809 Family history of malignant neoplasm, unspecified: Secondary | ICD-10-CM | POA: Diagnosis not present

## 2024-05-09 DIAGNOSIS — Z923 Personal history of irradiation: Secondary | ICD-10-CM | POA: Diagnosis not present

## 2024-05-09 LAB — RAD ONC ARIA SESSION SUMMARY
Course Elapsed Days: 21
Plan Fractions Treated to Date: 13
Plan Prescribed Dose Per Fraction: 2.67 Gy
Plan Total Fractions Prescribed: 15
Plan Total Prescribed Dose: 40.05 Gy
Reference Point Dosage Given to Date: 34.71 Gy
Reference Point Session Dosage Given: 2.67 Gy
Session Number: 13

## 2024-05-10 ENCOUNTER — Other Ambulatory Visit: Payer: Self-pay

## 2024-05-10 DIAGNOSIS — C50412 Malignant neoplasm of upper-outer quadrant of left female breast: Secondary | ICD-10-CM | POA: Diagnosis not present

## 2024-05-10 LAB — RAD ONC ARIA SESSION SUMMARY
Course Elapsed Days: 22
Plan Fractions Treated to Date: 14
Plan Prescribed Dose Per Fraction: 2.67 Gy
Plan Total Fractions Prescribed: 15
Plan Total Prescribed Dose: 40.05 Gy
Reference Point Dosage Given to Date: 37.38 Gy
Reference Point Session Dosage Given: 2.67 Gy
Session Number: 14

## 2024-05-11 ENCOUNTER — Ambulatory Visit

## 2024-05-11 ENCOUNTER — Other Ambulatory Visit: Payer: Self-pay

## 2024-05-11 DIAGNOSIS — C50412 Malignant neoplasm of upper-outer quadrant of left female breast: Secondary | ICD-10-CM | POA: Diagnosis not present

## 2024-05-11 LAB — RAD ONC ARIA SESSION SUMMARY
Course Elapsed Days: 23
Plan Fractions Treated to Date: 15
Plan Prescribed Dose Per Fraction: 2.67 Gy
Plan Total Fractions Prescribed: 15
Plan Total Prescribed Dose: 40.05 Gy
Reference Point Dosage Given to Date: 40.05 Gy
Reference Point Session Dosage Given: 2.67 Gy
Session Number: 15

## 2024-05-14 ENCOUNTER — Inpatient Hospital Stay: Attending: Hematology and Oncology | Admitting: Hematology and Oncology

## 2024-05-14 ENCOUNTER — Other Ambulatory Visit: Payer: Self-pay

## 2024-05-14 ENCOUNTER — Inpatient Hospital Stay

## 2024-05-14 ENCOUNTER — Other Ambulatory Visit: Payer: Self-pay | Admitting: *Deleted

## 2024-05-14 ENCOUNTER — Ambulatory Visit
Admission: RE | Admit: 2024-05-14 | Discharge: 2024-05-14 | Disposition: A | Source: Ambulatory Visit | Attending: Radiation Oncology

## 2024-05-14 ENCOUNTER — Ambulatory Visit
Admission: RE | Admit: 2024-05-14 | Discharge: 2024-05-14 | Disposition: A | Discharge status: Home / Self Care | Source: Ambulatory Visit | Attending: Radiation Oncology | Admitting: Radiation Oncology

## 2024-05-14 ENCOUNTER — Encounter: Payer: Self-pay | Admitting: Hematology and Oncology

## 2024-05-14 ENCOUNTER — Ambulatory Visit

## 2024-05-14 VITALS — BP 110/50 | HR 65 | Temp 97.6°F | Resp 16 | Wt 189.8 lb

## 2024-05-14 DIAGNOSIS — K219 Gastro-esophageal reflux disease without esophagitis: Secondary | ICD-10-CM | POA: Insufficient documentation

## 2024-05-14 DIAGNOSIS — C50412 Malignant neoplasm of upper-outer quadrant of left female breast: Secondary | ICD-10-CM

## 2024-05-14 DIAGNOSIS — Z791 Long term (current) use of non-steroidal anti-inflammatories (NSAID): Secondary | ICD-10-CM | POA: Insufficient documentation

## 2024-05-14 DIAGNOSIS — E860 Dehydration: Secondary | ICD-10-CM

## 2024-05-14 DIAGNOSIS — Z7984 Long term (current) use of oral hypoglycemic drugs: Secondary | ICD-10-CM | POA: Insufficient documentation

## 2024-05-14 DIAGNOSIS — Z5112 Encounter for antineoplastic immunotherapy: Secondary | ICD-10-CM | POA: Insufficient documentation

## 2024-05-14 DIAGNOSIS — Z170 Estrogen receptor positive status [ER+]: Secondary | ICD-10-CM | POA: Insufficient documentation

## 2024-05-14 DIAGNOSIS — E7800 Pure hypercholesterolemia, unspecified: Secondary | ICD-10-CM | POA: Insufficient documentation

## 2024-05-14 DIAGNOSIS — I4891 Unspecified atrial fibrillation: Secondary | ICD-10-CM | POA: Insufficient documentation

## 2024-05-14 DIAGNOSIS — Z923 Personal history of irradiation: Secondary | ICD-10-CM | POA: Insufficient documentation

## 2024-05-14 DIAGNOSIS — Z809 Family history of malignant neoplasm, unspecified: Secondary | ICD-10-CM | POA: Insufficient documentation

## 2024-05-14 DIAGNOSIS — N920 Excessive and frequent menstruation with regular cycle: Secondary | ICD-10-CM | POA: Insufficient documentation

## 2024-05-14 DIAGNOSIS — Z510 Encounter for antineoplastic radiation therapy: Secondary | ICD-10-CM | POA: Insufficient documentation

## 2024-05-14 DIAGNOSIS — Z9221 Personal history of antineoplastic chemotherapy: Secondary | ICD-10-CM | POA: Insufficient documentation

## 2024-05-14 DIAGNOSIS — Z79899 Other long term (current) drug therapy: Secondary | ICD-10-CM | POA: Insufficient documentation

## 2024-05-14 LAB — RAD ONC ARIA SESSION SUMMARY
Course Elapsed Days: 26
Plan Fractions Treated to Date: 1
Plan Prescribed Dose Per Fraction: 2 Gy
Plan Total Fractions Prescribed: 5
Plan Total Prescribed Dose: 10 Gy
Reference Point Dosage Given to Date: 2 Gy
Reference Point Session Dosage Given: 2 Gy
Session Number: 16

## 2024-05-14 MED ORDER — TRASTUZUMAB-ANNS CHEMO 150 MG IV SOLR
6.0000 mg/kg | Freq: Once | INTRAVENOUS | Status: AC
  Administered 2024-05-14: 525 mg via INTRAVENOUS
  Filled 2024-05-14: qty 25

## 2024-05-14 MED ORDER — ANASTROZOLE 1 MG PO TABS: 1.0000 mg | ORAL_TABLET | Freq: Every day | ORAL | 3 refills | Status: AC

## 2024-05-14 MED ORDER — ACETAMINOPHEN 325 MG PO TABS
650.0000 mg | ORAL_TABLET | Freq: Once | ORAL | Status: AC
  Administered 2024-05-14: 650 mg via ORAL
  Filled 2024-05-14: qty 2

## 2024-05-14 MED ORDER — SODIUM CHLORIDE 0.9 % IV SOLN
420.0000 mg | Freq: Once | INTRAVENOUS | Status: AC
  Administered 2024-05-14: 420 mg via INTRAVENOUS
  Filled 2024-05-14: qty 14

## 2024-05-14 MED ORDER — SODIUM CHLORIDE 0.9 % IV SOLN: INTRAVENOUS | Status: DC

## 2024-05-14 NOTE — Progress Notes (Signed)
 Patient declined 30 minute post observation after Perjeta . Patients tolerated tx well w/o incident. VSS stable at discharge. Ambulatory to lobby.

## 2024-05-14 NOTE — Progress Notes (Signed)
 [facility] CONSULT NOTE  Patient Care Team: [facility], [facility] [place] as PCP - General (Family Medicine) Sheena Pugh, DO as PCP - Cardiology (Cardiology) Tyree Nanetta SAILOR, RN as Oncology Nurse Navigator Loretha Ash, MD as Consulting Physician (Hematology and Oncology) Belinda Cough, MD as Consulting Physician (General Surgery) Izell Domino, MD as Attending Physician (Radiation Oncology)  CHIEF COMPLAINTS/PURPOSE OF CONSULTATION:  Newly diagnosed breast cancer  HISTORY OF PRESENTING ILLNESS:  Martha Washington 58 y.o. female is here because of recent diagnosis of left breast cancer  I reviewed her records extensively and collaborated the history with the patient.  SUMMARY OF ONCOLOGIC HISTORY: Oncology History  Malignant neoplasm of upper-outer quadrant of left breast in female, estrogen receptor positive (HCC)  08/04/2023 Mammogram   Further evaluation is suggested for calcifications in the left breast.  There is a group of coarse heterogeneous calcifications associated with an asymmetry in the UPPER OUTER QUADRANT at posterior depth spanning approximately 3.2 x 2.2 x 2.9 cm (AP x transverse x CC). There are linear and branching forms within the large group. There is a possible mass at the posterior margin of the calcifications measuring just under 1 cm in size.   Targeted ultrasound is performed, demonstrating an irregular hypoechoic mass with angular margins and associated calcifications at 2 o'clock 9 cm from the nipple measuring approximately 2.1 x 1.6 x 1.6 cm, demonstrating posterior acoustic shadowing and demonstrating internal power Doppler flow. There is a 0.4 x 0.3 x 0.2 cm satellite mass at 2 o'clock 7 cm from the nipple which is approximately 2 cm removed from the dominant mass.     08/16/2023 Pathology Results     08/22/2023 Initial Diagnosis   Malignant neoplasm of upper-outer quadrant of left breast in female, estrogen  receptor positive (HCC)   08/24/2023 Cancer Staging   Staging form: Breast, AJCC 8th Edition - Clinical stage from 08/24/2023: Stage IB (cT2, cN0, cM0, G2, ER+, PR+, HER2+) - Signed by Loretha Ash, MD on 08/24/2023 Stage prefix: Initial diagnosis Histologic grading system: 3 grade system Laterality: Left Staged by: Pathologist and managing physician Stage used in treatment planning: Yes National guidelines used in treatment planning: Yes Type of national guideline used in treatment planning: NCCN   08/31/2023 Genetic Testing   Negative [facility] CancerNext+RNAinsight Panel.  Report date is 08/31/2023.   The [employer] CancerNext+RNAinsight Panel includes sequencing, rearrangement analysis, and RNA analysis for the following 39 genes: APC, ATM, BAP1, BARD1, BMPR1A, BRCA1, BRCA2, BRIP1, CDH1, CDKN2A, CHEK2, FH, FLCN, MET, MLH1, MSH2, MSH6, MUTYH, NF1, NTHL1, PALB2, PMS2, PTEN, RAD51C, RAD51D, SMAD4, STK11, TP53, TSC1, TSC2, and VHL (sequencing and deletion/duplication); AXIN2, HOXB13, MBD4, MSH3, POLD1 and POLE (sequencing only); EPCAM and GREM1 (deletion/duplication only).    09/16/2023 - 01/13/2024 Chemotherapy   Patient is on Treatment Plan : BREAST  Docetaxel  + Carboplatin  + Trastuzumab  + Pertuzumab   (TCHP) q21d      04/02/2024 -  Chemotherapy   Patient is on Treatment Plan : BREAST Trastuzumab  + Pertuzumab  q21d x 11 cycles      Discussed the use of AI scribe software for clinical note transcription with the patient, who gave verbal consent to proceed. History of Present Illness   History of Present Illness  [name] is a 58 year old female with triple positive breast cancer who presents for ongoing treatment and management.  Discussed the use of AI scribe software for clinical note transcription with the patient, who gave verbal consent to proceed.  History of Present Illness Martha Washington is a 58 year old female with breast cancer who presents for follow-up after radiation  therapy.  She is completing her radiation therapy for breast cancer, with the last session scheduled for May 21, 2024. She has been undergoing treatment and is set to start anastrozole after a brief interval post-radiation. She is currently receiving antibody therapy, which is expected to continue until March 2026.  She is involved in regular physical activity, walking at least 30 minutes a day, and takes vitamin D and calcium supplements.  She is actively involved in the care of her darra Skates, who has experienced significant changes following the loss of his father.   Rest of the pertinent 10 point ROS reviewed and neg.  MEDICAL HISTORY:  Past Medical History:  Diagnosis Date   Atrial fibrillation with RVR (HCC)    GERD (gastroesophageal reflux disease)    no meds   High cholesterol    Menorrhagia 07/13/2013   Missed abortion    resolved - no surgery required   New onset atrial fibrillation (HCC) 08/04/2017   with RVR to the 160s; S/P hammertoe OR/notes 08/04/2017   S/P bunionectomy 08/04/2017   SVD (spontaneous vaginal delivery) X 2   Tachycardia 08/04/2017    SURGICAL HISTORY: Past Surgical History:  Procedure Laterality Date   ABDOMINAL HYSTERECTOMY     AXILLARY SENTINEL NODE BIOPSY Left 03/07/2024   Procedure: BIOPSY, LYMPH NODE, SENTINEL, AXILLARY;  Surgeon: Belinda Cough, MD;  Location: [facility];  Service: General;  Laterality: Left;  LEFT BREAST RADIOACTIVE SEED LOCALIZED LUMPECTOMY LEFT AXILLARY DEEP SENTINEL LYMPH NODE BIOPSY LMA w/PEC BLOCK   BACK SURGERY     BREAST BIOPSY Left 08/16/2023   US  LT BREAST BX W LOC DEV 1ST LESION IMG BX SPEC US  GUIDE 08/16/2023 [facility]MO[facility]   BREAST BIOPSY Left 09/12/2023   US  LT BREAST BX W LOC DEV 1ST LESION IMG BX SPEC US  GUIDE 09/12/2023 [facility] MAMMO[facility]   BREAST BIOPSY  03/06/2024   MM LT RADIOACTIVE SEED LOC MAMMO GUIDE 03/06/2024 [facility]AMMOGR[facility]   BREAST LUMPECTOMY WITH RADIOACTIVE SEED  LOCALIZATION Left 03/07/2024   Procedure: BREAST LUMPECTOMY WITH RADIOACTIVE SEED LOCALIZATION;  Surgeon: Belinda Cough, MD;  Location: [facility];  Service: General;  Laterality: Left;   BUNIONECTOMY WITH HAMMERTOE RECONSTRUCTION Right 03/2017   BUNIONECTOMY WITH HAMMERTOE RECONSTRUCTION Left 08/04/2017   BUNIONECTOMY WITH HAMMERTOE RECONSTRUCTION Left 08/04/2017   Procedure: Left modified McBride and Lapidus bunion corrections; Left 2-3 Weil and hammertoe corrections;  Surgeon: Kit Rush, MD;  Location: [facility];  Service: Orthopedics;  Laterality: Left;   PORTACATH PLACEMENT Right 09/15/2023   Procedure: INSERTION PORT-A-CATH WITH ULTASOUND GUIDANCE;  Surgeon: Belinda Cough, MD;  Location: [facility];  Service: General;  Laterality: Right;  Leave port accessed   ROBOTIC ASSISTED TOTAL HYSTERECTOMY WITH BILATERAL SALPINGO OOPHERECTOMY N/A 07/13/2013   Procedure: ROBOTIC ASSISTED TOTAL HYSTERECTOMY WITH BILATERAL SALPINGECTOMY;  Surgeon: Nena DELENA App, MD;  Location: [facility];  Service: Gynecology;  Laterality: N/A;   SHOULDER ARTHROSCOPY W/ ROTATOR CUFF REPAIR Right 2010   SHOULDER ARTHROSCOPY WITH ROTATOR CUFF REPAIR Right 10/17/2014   Procedure: SHOULDER ARTHROSCOPY WITH ROTATOR CUFF REPAIR;  Surgeon: Toribio Chancy, MD;  Location: [facility];  Service: Orthopedics;  Laterality: Right;   TUBAL LIGATION     WEIL OSTEOTOMY Left 08/04/2017   Procedure: Left 2nd and 3rd weil osteotomy;  Surgeon: Kit Rush, MD;  Location: [facility];  Service: Orthopedics;  Laterality: Left;   WISDOM TOOTH EXTRACTION      SOCIAL HISTORY: Social History   Socioeconomic History   Marital status: Married    Spouse name: Merchant navy officer   Number of children: Not on file   Years of education: Not on file   Highest education level: Not on file  Occupational History   Not on file  Tobacco Use   Smoking status: Never   Smokeless tobacco: Never   Vaping Use   Vaping status: Never Used  Substance and Sexual Activity   Alcohol use: No   Drug use: No   Sexual activity: Not on file  Other Topics Concern   Not on file  Social History Narrative   Lives with husband   Right Handed   Drinks 1 cup caffeine daily   Social Drivers of Health   Financial Resource Strain: Not on file  Food Insecurity: No Food Insecurity (04/06/2024)   Hunger Vital Sign    Worried About Running Out of Food in the Last Year: Never true    Ran Out of Food in the Last Year: Never true  Transportation Needs: No Transportation Needs (04/06/2024)   PRAPARE - Administrator, Civil Service (Medical): No    Lack of Transportation (Non-Medical): No  Physical Activity: Not on file  Stress: Not on file  Social Connections: Unknown (12/22/2021)   Received from [facility]   Social Network    Social Network: Not on file  Intimate Partner Violence: Not At Risk (04/06/2024)   Humiliation, Afraid, Rape, and Kick questionnaire    Fear of Current or Ex-Partner: No    Emotionally Abused: No    Physically Abused: No    Sexually Abused: No    FAMILY HISTORY: Family History  Problem Relation Age of Onset   Hypertension Mother    Hypertension Father    Bone cancer Brother 18       or other primary?    ALLERGIES:  is allergic to azithromycin, morphine and codeine, and oxycodone .  MEDICATIONS:  Current Outpatient Medications  Medication Sig Dispense Refill   celecoxib (CELEBREX) 100 MG capsule Take 100 mg by mouth daily.     dipyridamole (PERSANTINE) 75 MG tablet Take 75 mg by mouth in the morning and at bedtime.     doxycycline (MONODOX) 100 MG capsule Take 100 mg by mouth 2 (two) times daily.     escitalopram  (LEXAPRO ) 10 MG tablet Take 1 tablet (10 mg total) by mouth daily. 90 tablet 0   Hydroxychloroquine Sulfate 100 MG TABS Take 100 mg by mouth in the morning and at bedtime.     lidocaine -prilocaine  (EMLA ) cream Apply 1 Application topically  once.     metFORMIN (GLUCOPHAGE) 500 MG tablet Take 500 mg by mouth 2 (two) times daily with a meal.     simvastatin (ZOCOR) 20 MG tablet Take 20 mg by mouth daily at 6 PM.     traMADol  (ULTRAM ) 50 MG tablet Take 1-2 tablets (50-100 mg total) by mouth every 6 (six) hours as needed for moderate pain (pain score 4-6) or severe pain (pain score 7-10). 20 tablet 0   VITAMIN D PO Take by mouth.     No current facility-administered medications for this visit.    REVIEW OF SYSTEMS:   Constitutional: Denies fevers, chills or abnormal night sweats Eyes: Denies blurriness of vision, double vision or watery eyes Ears, nose, mouth, throat, and face: Denies mucositis or  sore throat Respiratory: Denies cough, dyspnea or wheezes Cardiovascular: Denies palpitation, chest discomfort or lower extremity swelling Gastrointestinal:  Denies nausea, heartburn or change in bowel habits Skin: Denies abnormal skin rashes Lymphatics: Denies new lymphadenopathy or easy bruising Neurological:Denies numbness, tingling or new weaknesses Behavioral/Psych: Mood is stable, no new changes  Breast: Denies any palpable lumps or discharge All other systems were reviewed with the patient and are negative.  PHYSICAL EXAMINATION: ECOG PERFORMANCE STATUS: 0 - Asymptomatic  Vitals:   05/14/24 0952  BP: (!) 110/50  Pulse: 65  Resp: 16  Temp: 97.6 F (36.4 C)  SpO2: 100%    Filed Weights   05/14/24 0952  Weight: 189 lb 12.8 oz (86.1 kg)    GENERAL:alert, no distress and comfortable Chest: CTA bilaterally RRR Abdomen: soft, non tender, non distended, No LE edema.  LABORATORY DATA:  I have reviewed the data as listed Lab Results  Component Value Date   WBC 4.9 01/10/2024   HGB 12.1 01/10/2024   HCT 36.6 01/10/2024   MCV 85.5 01/10/2024   PLT 299 01/10/2024   Lab Results  Component Value Date   NA 141 01/10/2024   K 3.7 01/10/2024   CL 107 01/10/2024   CO2 26 01/10/2024    RADIOGRAPHIC STUDIES: I  have personally reviewed the radiological reports and agreed with the findings in the report.  ASSESSMENT AND PLAN:   Assessment and Plan Assessment & Plan Triple positive left breast cancer, post-surgical, on adjuvant therapy  Breast cancer Undergoing radiation therapy, concluding May 21, 2024. Antibody therapy ongoing, ending March 2026. Normal bone density scan in July. - Complete radiation therapy on May 21, 2024. - Initiate anastrozole therapy two weeks post-radiation. - Continue antibody therapy until March 2026. - Order Guardant blood test for recurrence detection on June 04, 2024. - Advise 30-minute daily walking, continue vitamin D and calcium. - Baseline bone density normal.  Time spent: 20 min  All questions were answered. The patient knows to call the clinic with any problems, questions or concerns.    Amber Stalls, MD 05/14/24

## 2024-05-14 NOTE — Patient Instructions (Signed)
 [facility] - A DEPT OF [facility]  Discharge Instructions: Thank you for choosing [facility] to provide your oncology and hematology care.   If you have a lab appointment with the Cancer Center, please go directly to the Cancer Center and check in at the registration area.   Wear comfortable clothing and clothing appropriate for easy access to any Portacath or PICC line.   We strive to give you quality time with your provider. You may need to reschedule your appointment if you arrive late (15 or more minutes).  Arriving late affects you and other patients whose appointments are after yours.  Also, if you miss three or more appointments without notifying the office, you may be dismissed from the clinic at the provider's discretion.      For prescription refill requests, have your pharmacy contact our office and allow 72 hours for refills to be completed.    Today you received the following chemotherapy and/or immunotherapy agents: pertuzumab  (PERJETA ), trastuzumab -anns (KANJINTI )   To help prevent nausea and vomiting after your treatment, we encourage you to take your nausea medication as directed.  BELOW ARE SYMPTOMS THAT SHOULD BE REPORTED IMMEDIATELY: *FEVER GREATER THAN 100.4 F (38 C) OR HIGHER *CHILLS OR SWEATING *NAUSEA AND VOMITING THAT IS NOT CONTROLLED WITH YOUR NAUSEA MEDICATION *UNUSUAL SHORTNESS OF BREATH *UNUSUAL BRUISING OR BLEEDING *URINARY PROBLEMS (pain or burning when urinating, or frequent urination) *BOWEL PROBLEMS (unusual diarrhea, constipation, pain near the anus) TENDERNESS IN MOUTH AND THROAT WITH OR WITHOUT PRESENCE OF ULCERS (sore throat, sores in mouth, or a toothache) UNUSUAL RASH, SWELLING OR PAIN  UNUSUAL VAGINAL DISCHARGE OR ITCHING   Items with * indicate a potential emergency and should be followed up as soon as possible or go to the Emergency Department if any problems should occur.  Please show the  CHEMOTHERAPY ALERT CARD or IMMUNOTHERAPY ALERT CARD at check-in to the Emergency Department and triage nurse.  Should you have questions after your visit or need to cancel or reschedule your appointment, please contact [facility]  Dept: [phone]  and follow the prompts.  Office hours are 8:00 a.m. to 4:30 p.m. Monday - Friday. Please note that voicemails left after 4:00 p.m. may not be returned until the following business day.  We are closed weekends and major holidays. You have access to a nurse at all times for urgent questions. Please call the main number to the clinic Dept: [phone] and follow the prompts.   For any non-urgent questions, you may also contact your provider using MyChart. We now offer e-Visits for anyone 55 and older to request care online for non-urgent symptoms. For details visit [email or web address].   Also download the MyChart app! Go to the app store, search MyChart, open the app, select [facility], and log in with your MyChart username and password.

## 2024-05-15 ENCOUNTER — Ambulatory Visit

## 2024-05-15 ENCOUNTER — Ambulatory Visit
Admission: RE | Admit: 2024-05-15 | Discharge: 2024-05-15 | Disposition: A | Source: Ambulatory Visit | Attending: Radiation Oncology

## 2024-05-15 ENCOUNTER — Other Ambulatory Visit: Payer: Self-pay

## 2024-05-15 DIAGNOSIS — C50412 Malignant neoplasm of upper-outer quadrant of left female breast: Secondary | ICD-10-CM | POA: Diagnosis not present

## 2024-05-15 LAB — RAD ONC ARIA SESSION SUMMARY
Course Elapsed Days: 27
Plan Fractions Treated to Date: 2
Plan Prescribed Dose Per Fraction: 2 Gy
Plan Total Fractions Prescribed: 5
Plan Total Prescribed Dose: 10 Gy
Reference Point Dosage Given to Date: 4 Gy
Reference Point Session Dosage Given: 2 Gy
Session Number: 17

## 2024-05-16 ENCOUNTER — Ambulatory Visit
Admission: RE | Admit: 2024-05-16 | Discharge: 2024-05-16 | Disposition: A | Discharge status: Home / Self Care | Source: Ambulatory Visit | Attending: Radiation Oncology | Admitting: Radiation Oncology

## 2024-05-16 ENCOUNTER — Other Ambulatory Visit: Payer: Self-pay

## 2024-05-16 ENCOUNTER — Ambulatory Visit

## 2024-05-16 DIAGNOSIS — C50412 Malignant neoplasm of upper-outer quadrant of left female breast: Secondary | ICD-10-CM | POA: Diagnosis not present

## 2024-05-16 LAB — RAD ONC ARIA SESSION SUMMARY
Course Elapsed Days: 28
Plan Fractions Treated to Date: 3
Plan Prescribed Dose Per Fraction: 2 Gy
Plan Total Fractions Prescribed: 5
Plan Total Prescribed Dose: 10 Gy
Reference Point Dosage Given to Date: 6 Gy
Reference Point Session Dosage Given: 2 Gy
Session Number: 18

## 2024-05-17 ENCOUNTER — Ambulatory Visit
Admission: RE | Admit: 2024-05-17 | Discharge: 2024-05-17 | Disposition: A | Source: Ambulatory Visit | Attending: Radiation Oncology

## 2024-05-17 ENCOUNTER — Other Ambulatory Visit: Payer: Self-pay

## 2024-05-17 DIAGNOSIS — C50412 Malignant neoplasm of upper-outer quadrant of left female breast: Secondary | ICD-10-CM | POA: Diagnosis not present

## 2024-05-17 LAB — RAD ONC ARIA SESSION SUMMARY
Course Elapsed Days: 29
Plan Fractions Treated to Date: 4
Plan Prescribed Dose Per Fraction: 2 Gy
Plan Total Fractions Prescribed: 5
Plan Total Prescribed Dose: 10 Gy
Reference Point Dosage Given to Date: 8 Gy
Reference Point Session Dosage Given: 2 Gy
Session Number: 19

## 2024-05-18 ENCOUNTER — Ambulatory Visit

## 2024-05-21 ENCOUNTER — Ambulatory Visit

## 2024-05-22 ENCOUNTER — Ambulatory Visit: Admission: RE | Admit: 2024-05-22 | Source: Ambulatory Visit

## 2024-05-22 ENCOUNTER — Ambulatory Visit

## 2024-05-23 NOTE — Radiation Completion Notes (Signed)
 Patient Name: Martha Washington, Martha Washington MRN: [id number] Date of Birth: [birth year] Referring Physician: CHIQUITA SHARPS, M.D. Date of Service: 2024-05-23 Radiation Oncologist: Lauraine Golden, M.D. [facility]                             RADIATION ONCOLOGY END OF TREATMENT NOTE     Diagnosis: C50.412 Malignant neoplasm of upper-outer quadrant of left female breast Staging on 2023-08-24: Malignant neoplasm of upper-outer quadrant of left breast in female, estrogen receptor positive (HCC) T=cT2, N=cN0, M=cM0 Intent: Curative     ==========DELIVERED PLANS==========  First Treatment Date: 2024-04-18 Last Treatment Date: 2024-05-17   Plan Name: Breast_L_BH Site: Breast, Left Technique: 3D Mode: Photon Dose Per Fraction: 2.67 Gy Prescribed Dose (Delivered / Prescribed): 40.05 Gy / 40.05 Gy Prescribed Fxs (Delivered / Prescribed): 15 / 15   Plan Name: Brst_L_Bst_BH Site: Breast, Left Technique: 3D Mode: Photon Dose Per Fraction: 2 Gy Prescribed Dose (Delivered / Prescribed): 8 Gy / 10 Gy Prescribed Fxs (Delivered / Prescribed): 4 / 5     ==========ON TREATMENT VISIT DATES========== 2024-04-23, 2024-04-30, 2024-05-07, 2024-05-14     ==========UPCOMING VISITS========== 06/21/2024 [facility] FOLLOW UP 20 Golden Lauraine, MD  06/13/2024 [facility] ONCOLOGY INFUSION 2HR30MIN (150) [facility] INFUSION  06/13/2024 [facility] ONCOL[facility] EST PT 15 Iruku, Praveena, MD  06/04/2024 [facility] LAB [facility] LAB        ==========APPENDIX - ON TREATMENT VISIT NOTES==========   See weekly On Treatment Notes in Epic for details in the Media tab (listed as Progress notes on the On Treatment Visit Dates listed above).

## 2024-06-04 ENCOUNTER — Ambulatory Visit: Admitting: Hematology and Oncology

## 2024-06-04 ENCOUNTER — Ambulatory Visit

## 2024-06-04 ENCOUNTER — Inpatient Hospital Stay

## 2024-06-04 DIAGNOSIS — C50412 Malignant neoplasm of upper-outer quadrant of left female breast: Secondary | ICD-10-CM

## 2024-06-05 ENCOUNTER — Encounter: Payer: Self-pay | Admitting: *Deleted

## 2024-06-05 DIAGNOSIS — E860 Dehydration: Secondary | ICD-10-CM

## 2024-06-08 NOTE — Progress Notes (Signed)
 Martha Washington is here today for follow up post radiation to the breast.   Breast Side:Left Breast    They completed their radiation on: 05/17/2024   Does the patient complain of any of the following: Post radiation skin issues: None. Encouraged patient to use vitamin E oil twice daily for the next two months. Breast Tenderness: Yes Breast Swelling: None Lymphadema: None Range of Motion limitations: None Fatigue post radiation: Fatigue Appetite good/fair/poor: Good  Additional comments if applicable:  None. Encouraged patient to call our office for any questions or concerns.

## 2024-06-11 ENCOUNTER — Ambulatory Visit

## 2024-06-12 ENCOUNTER — Other Ambulatory Visit: Payer: Self-pay

## 2024-06-13 ENCOUNTER — Inpatient Hospital Stay

## 2024-06-13 ENCOUNTER — Inpatient Hospital Stay: Attending: Hematology and Oncology | Admitting: Hematology and Oncology

## 2024-06-13 ENCOUNTER — Encounter: Payer: Self-pay | Admitting: Hematology and Oncology

## 2024-06-13 ENCOUNTER — Other Ambulatory Visit: Payer: Self-pay

## 2024-06-13 VITALS — BP 111/73 | HR 64 | Temp 98.5°F | Resp 17

## 2024-06-13 VITALS — BP 103/63 | HR 71 | Temp 98.0°F | Resp 16 | Wt 189.1 lb

## 2024-06-13 DIAGNOSIS — C50412 Malignant neoplasm of upper-outer quadrant of left female breast: Secondary | ICD-10-CM | POA: Insufficient documentation

## 2024-06-13 DIAGNOSIS — E7800 Pure hypercholesterolemia, unspecified: Secondary | ICD-10-CM | POA: Insufficient documentation

## 2024-06-13 DIAGNOSIS — Z923 Personal history of irradiation: Secondary | ICD-10-CM | POA: Diagnosis not present

## 2024-06-13 DIAGNOSIS — Z79811 Long term (current) use of aromatase inhibitors: Secondary | ICD-10-CM | POA: Insufficient documentation

## 2024-06-13 DIAGNOSIS — K219 Gastro-esophageal reflux disease without esophagitis: Secondary | ICD-10-CM | POA: Diagnosis not present

## 2024-06-13 DIAGNOSIS — Z79899 Other long term (current) drug therapy: Secondary | ICD-10-CM | POA: Insufficient documentation

## 2024-06-13 DIAGNOSIS — I4891 Unspecified atrial fibrillation: Secondary | ICD-10-CM | POA: Diagnosis not present

## 2024-06-13 DIAGNOSIS — Z7984 Long term (current) use of oral hypoglycemic drugs: Secondary | ICD-10-CM | POA: Insufficient documentation

## 2024-06-13 DIAGNOSIS — Z809 Family history of malignant neoplasm, unspecified: Secondary | ICD-10-CM | POA: Diagnosis not present

## 2024-06-13 DIAGNOSIS — Z1732 Human epidermal growth factor receptor 2 negative status: Secondary | ICD-10-CM | POA: Insufficient documentation

## 2024-06-13 DIAGNOSIS — Z5112 Encounter for antineoplastic immunotherapy: Secondary | ICD-10-CM | POA: Insufficient documentation

## 2024-06-13 DIAGNOSIS — Z170 Estrogen receptor positive status [ER+]: Secondary | ICD-10-CM | POA: Insufficient documentation

## 2024-06-13 DIAGNOSIS — Z1721 Progesterone receptor positive status: Secondary | ICD-10-CM | POA: Diagnosis not present

## 2024-06-13 DIAGNOSIS — E860 Dehydration: Secondary | ICD-10-CM

## 2024-06-13 DIAGNOSIS — Z791 Long term (current) use of non-steroidal anti-inflammatories (NSAID): Secondary | ICD-10-CM | POA: Insufficient documentation

## 2024-06-13 MED ORDER — SODIUM CHLORIDE 0.9 % IV SOLN: INTRAVENOUS | Status: DC

## 2024-06-13 MED ORDER — SODIUM CHLORIDE 0.9 % IV SOLN
420.0000 mg | Freq: Once | INTRAVENOUS | Status: AC
  Administered 2024-06-13: 420 mg via INTRAVENOUS
  Filled 2024-06-13: qty 14

## 2024-06-13 MED ORDER — TRASTUZUMAB-ANNS CHEMO 150 MG IV SOLR
6.0000 mg/kg | Freq: Once | INTRAVENOUS | Status: AC
  Administered 2024-06-13: 525 mg via INTRAVENOUS
  Filled 2024-06-13: qty 25

## 2024-06-13 MED ORDER — ACETAMINOPHEN 325 MG PO TABS
650.0000 mg | ORAL_TABLET | Freq: Once | ORAL | Status: AC
  Administered 2024-06-13: 650 mg via ORAL
  Filled 2024-06-13: qty 2

## 2024-06-13 NOTE — Patient Instructions (Signed)
 [facility] - A DEPT OF [place]  Discharge Instructions: Thank you for choosing [facility] to provide your oncology and hematology care.   If you have a lab appointment with the Cancer Center, please go directly to the Cancer Center and check in at the registration area.   Wear comfortable clothing and clothing appropriate for easy access to any Portacath or PICC line.   We strive to give you quality time with your provider. You may need to reschedule your appointment if you arrive late (15 or more minutes).  Arriving late affects you and other patients whose appointments are after yours.  Also, if you miss three or more appointments without notifying the office, you may be dismissed from the clinic at the provider's discretion.      For prescription refill requests, have your pharmacy contact our office and allow 72 hours for refills to be completed.    Today you received the following chemotherapy and/or immunotherapy agents: Trastuzumab, Pertuzumab      To help prevent nausea and vomiting after your treatment, we encourage you to take your nausea medication as directed.  BELOW ARE SYMPTOMS THAT SHOULD BE REPORTED IMMEDIATELY: *FEVER GREATER THAN 100.4 F (38 C) OR HIGHER *CHILLS OR SWEATING *NAUSEA AND VOMITING THAT IS NOT CONTROLLED WITH YOUR NAUSEA MEDICATION *UNUSUAL SHORTNESS OF BREATH *UNUSUAL BRUISING OR BLEEDING *URINARY PROBLEMS (pain or burning when urinating, or frequent urination) *BOWEL PROBLEMS (unusual diarrhea, constipation, pain near the anus) TENDERNESS IN MOUTH AND THROAT WITH OR WITHOUT PRESENCE OF ULCERS (sore throat, sores in mouth, or a toothache) UNUSUAL RASH, SWELLING OR PAIN  UNUSUAL VAGINAL DISCHARGE OR ITCHING   Items with * indicate a potential emergency and should be followed up as soon as possible or go to the Emergency Department if any problems should occur.  Please show the CHEMOTHERAPY ALERT CARD or  IMMUNOTHERAPY ALERT CARD at check-in to the Emergency Department and triage nurse.  Should you have questions after your visit or need to cancel or reschedule your appointment, please contact [facility]  Dept: [phone]  and follow the prompts.  Office hours are 8:00 a.m. to 4:30 p.m. Monday - Friday. Please note that voicemails left after 4:00 p.m. may not be returned until the following business day.  We are closed weekends and major holidays. You have access to a nurse at all times for urgent questions. Please call the main number to the clinic Dept: [phone] and follow the prompts.   For any non-urgent questions, you may also contact your provider using MyChart. We now offer e-Visits for anyone 41 and older to request care online for non-urgent symptoms. For details visit [email or web address].   Also download the MyChart app! Go to the app store, search MyChart, open the app, select [facility], and log in with your MyChart username and password.

## 2024-06-13 NOTE — Progress Notes (Signed)
 [facility] CONSULT NOTE  Patient Care Team: [facility], [facility] [place] as PCP - General (Family Medicine) Sheena Pugh, DO as PCP - Cardiology (Cardiology) Tyree Nanetta SAILOR, RN as Oncology Nurse Navigator Loretha Ash, MD as Consulting Physician (Hematology and Oncology) Belinda Cough, MD as Consulting Physician (General Surgery) Izell Domino, MD as Attending Physician (Radiation Oncology)  CHIEF COMPLAINTS/PURPOSE OF CONSULTATION:  Newly diagnosed breast cancer  HISTORY OF PRESENTING ILLNESS:  Martha Washington 58 y.o. female is here because of recent diagnosis of left breast cancer  I reviewed her records extensively and collaborated the history with the patient.  SUMMARY OF ONCOLOGIC HISTORY: Oncology History  Malignant neoplasm of upper-outer quadrant of left breast in female, estrogen receptor positive (HCC)  08/04/2023 Mammogram   Further evaluation is suggested for calcifications in the left breast.  There is a group of coarse heterogeneous calcifications associated with an asymmetry in the UPPER OUTER QUADRANT at posterior depth spanning approximately 3.2 x 2.2 x 2.9 cm (AP x transverse x CC). There are linear and branching forms within the large group. There is a possible mass at the posterior margin of the calcifications measuring just under 1 cm in size.   Targeted ultrasound is performed, demonstrating an irregular hypoechoic mass with angular margins and associated calcifications at 2 o'clock 9 cm from the nipple measuring approximately 2.1 x 1.6 x 1.6 cm, demonstrating posterior acoustic shadowing and demonstrating internal power Doppler flow. There is a 0.4 x 0.3 x 0.2 cm satellite mass at 2 o'clock 7 cm from the nipple which is approximately 2 cm removed from the dominant mass.     08/16/2023 Pathology Results     08/22/2023 Initial Diagnosis   Malignant neoplasm of upper-outer quadrant of left breast in female, estrogen  receptor positive (HCC)   08/24/2023 Cancer Staging   Staging form: Breast, AJCC 8th Edition - Clinical stage from 08/24/2023: Stage IB (cT2, cN0, cM0, G2, ER+, PR+, HER2+) - Signed by Loretha Ash, MD on 08/24/2023 Stage prefix: Initial diagnosis Histologic grading system: 3 grade system Laterality: Left Staged by: Pathologist and managing physician Stage used in treatment planning: Yes National guidelines used in treatment planning: Yes Type of national guideline used in treatment planning: NCCN   08/31/2023 Genetic Testing   Negative [facility] CancerNext+RNAinsight Panel.  Report date is 08/31/2023.   The [employer] CancerNext+RNAinsight Panel includes sequencing, rearrangement analysis, and RNA analysis for the following 39 genes: APC, ATM, BAP1, BARD1, BMPR1A, BRCA1, BRCA2, BRIP1, CDH1, CDKN2A, CHEK2, FH, FLCN, MET, MLH1, MSH2, MSH6, MUTYH, NF1, NTHL1, PALB2, PMS2, PTEN, RAD51C, RAD51D, SMAD4, STK11, TP53, TSC1, TSC2, and VHL (sequencing and deletion/duplication); AXIN2, HOXB13, MBD4, MSH3, POLD1 and POLE (sequencing only); EPCAM and GREM1 (deletion/duplication only).    09/16/2023 - 01/13/2024 Chemotherapy   Patient is on Treatment Plan : BREAST  Docetaxel  + Carboplatin  + Trastuzumab  + Pertuzumab   (TCHP) q21d      04/02/2024 -  Chemotherapy   Patient is on Treatment Plan : BREAST Trastuzumab  + Pertuzumab  q21d x 11 cycles      Discussed the use of AI scribe software for clinical note transcription with the patient, who gave verbal consent to proceed. History of Present Illness   History of Present Illness  Martha Washington is a 58 year old female with triple positive breast cancer who presents for ongoing treatment and management.  Discussed the use of AI scribe software for clinical note transcription with the patient, who gave verbal consent to proceed.  History of Present Illness Martha Washington is a 58 year old female with breast cancer who presents for follow-up after radiation  therapy.  She is completing her radiation therapy for breast cancer, with the last session scheduled for May 21, 2024. She has been undergoing treatment and is set to start anastrozole after a brief interval post-radiation. She is currently receiving antibody therapy, which is expected to continue until March 2026.  She is involved in regular physical activity, walking at least 30 minutes a day, and takes vitamin D and calcium supplements.  She is actively involved in the care of her darra Skates, who has experienced significant changes following the loss of his father.   Rest of the pertinent 10 point ROS reviewed and neg.  MEDICAL HISTORY:  Past Medical History:  Diagnosis Date   Atrial fibrillation with RVR (HCC)    GERD (gastroesophageal reflux disease)    no meds   High cholesterol    Menorrhagia 07/13/2013   Missed abortion    resolved - no surgery required   New onset atrial fibrillation (HCC) 08/04/2017   with RVR to the 160s; S/P hammertoe OR/notes 08/04/2017   S/P bunionectomy 08/04/2017   SVD (spontaneous vaginal delivery) X 2   Tachycardia 08/04/2017    SURGICAL HISTORY: Past Surgical History:  Procedure Laterality Date   ABDOMINAL HYSTERECTOMY     AXILLARY SENTINEL NODE BIOPSY Left 03/07/2024   Procedure: BIOPSY, LYMPH NODE, SENTINEL, AXILLARY;  Surgeon: Belinda Cough, MD;  Location: [facility];  Service: General;  Laterality: Left;  LEFT BREAST RADIOACTIVE SEED LOCALIZED LUMPECTOMY LEFT AXILLARY DEEP SENTINEL LYMPH NODE BIOPSY LMA w/PEC BLOCK   BACK SURGERY     BREAST BIOPSY Left 08/16/2023   US  LT BREAST BX W LOC DEV 1ST LESION IMG BX SPEC US  GUIDE 08/16/2023 [facility]MO[facility]   BREAST BIOPSY Left 09/12/2023   US  LT BREAST BX W LOC DEV 1ST LESION IMG BX SPEC US  GUIDE 09/12/2023 [facility]AMMO[facility]   BREAST BIOPSY  03/06/2024   MM LT RADIOACTIVE SEED LOC MAMMO GUIDE 03/06/2024 [facility]AMMOGR[facility]   BREAST LUMPECTOMY WITH RADIOACTIVE SEED  LOCALIZATION Left 03/07/2024   Procedure: BREAST LUMPECTOMY WITH RADIOACTIVE SEED LOCALIZATION;  Surgeon: Belinda Cough, MD;  Location: [facility];  Service: General;  Laterality: Left;   BUNIONECTOMY WITH HAMMERTOE RECONSTRUCTION Right 03/2017   BUNIONECTOMY WITH HAMMERTOE RECONSTRUCTION Left 08/04/2017   BUNIONECTOMY WITH HAMMERTOE RECONSTRUCTION Left 08/04/2017   Procedure: Left modified McBride and Lapidus bunion corrections; Left 2-3 Weil and hammertoe corrections;  Surgeon: Kit Rush, MD;  Location: [facility];  Service: Orthopedics;  Laterality: Left;   PORTACATH PLACEMENT Right 09/15/2023   Procedure: INSERTION PORT-A-CATH WITH ULTASOUND GUIDANCE;  Surgeon: Belinda Cough, MD;  Location:  SURGERY CENTER;  Service: General;  Laterality: Right;  Leave port accessed   ROBOTIC ASSISTED TOTAL HYSTERECTOMY WITH BILATERAL SALPINGO OOPHERECTOMY N/A 07/13/2013   Procedure: ROBOTIC ASSISTED TOTAL HYSTERECTOMY WITH BILATERAL SALPINGECTOMY;  Surgeon: Nena DELENA App, MD;  Location: [facility];  Service: Gynecology;  Laterality: N/A;   SHOULDER ARTHROSCOPY W/ ROTATOR CUFF REPAIR Right 2010   SHOULDER ARTHROSCOPY WITH ROTATOR CUFF REPAIR Right 10/17/2014   Procedure: SHOULDER ARTHROSCOPY WITH ROTATOR CUFF REPAIR;  Surgeon: Toribio Chancy, MD;  Location:  SURGERY CENTER;  Service: Orthopedics;  Laterality: Right;   TUBAL LIGATION     WEIL OSTEOTOMY Left 08/04/2017   Procedure: Left 2nd and 3rd weil osteotomy;  Surgeon: Kit Rush, MD;  Location: [facility];  Service: Orthopedics;  Laterality: Left;   WISDOM TOOTH EXTRACTION      SOCIAL HISTORY: Social History   Socioeconomic History   Marital status: Married    Spouse name: [employer] Officer   Number of children: Not on file   Years of education: Not on file   Highest education level: Not on file  Occupational History   Not on file  Tobacco Use   Smoking status: Never   Smokeless tobacco: Never   Vaping Use   Vaping status: Never Used  Substance and Sexual Activity   Alcohol use: No   Drug use: No   Sexual activity: Not on file  Other Topics Concern   Not on file  Social History Narrative   Lives with husband   Right Handed   Drinks 1 cup caffeine daily   Social Drivers of Health   Financial Resource Strain: Not on file  Food Insecurity: No Food Insecurity (04/06/2024)   Hunger Vital Sign    Worried About Running Out of Food in the Last Year: Never true    Ran Out of Food in the Last Year: Never true  Transportation Needs: No Transportation Needs (04/06/2024)   PRAPARE - Administrator, Civil Service (Medical): No    Lack of Transportation (Non-Medical): No  Physical Activity: Not on file  Stress: Not on file  Social Connections: Unknown (12/22/2021)   Received from [facility]   Social Network    Social Network: Not on file  Intimate Partner Violence: Not At Risk (04/06/2024)   Humiliation, Afraid, Rape, and Kick questionnaire    Fear of Current or Ex-Partner: No    Emotionally Abused: No    Physically Abused: No    Sexually Abused: No    FAMILY HISTORY: Family History  Problem Relation Age of Onset   Hypertension Mother    Hypertension Father    Bone cancer Brother 67       or other primary?    ALLERGIES:  is allergic to azithromycin, morphine and codeine, and oxycodone .  MEDICATIONS:  Current Outpatient Medications  Medication Sig Dispense Refill   anastrozole (ARIMIDEX) 1 MG tablet Take 1 tablet (1 mg total) by mouth daily. 90 tablet 3   celecoxib (CELEBREX) 100 MG capsule Take 100 mg by mouth daily.     dipyridamole (PERSANTINE) 75 MG tablet Take 75 mg by mouth in the morning and at bedtime.     doxycycline (MONODOX) 100 MG capsule Take 100 mg by mouth 2 (two) times daily.     escitalopram  (LEXAPRO ) 10 MG tablet Take 1 tablet (10 mg total) by mouth daily. 90 tablet 0   Hydroxychloroquine Sulfate 100 MG TABS Take 100 mg by mouth in the  morning and at bedtime.     lidocaine -prilocaine  (EMLA ) cream Apply 1 Application topically once.     metFORMIN (GLUCOPHAGE) 500 MG tablet Take 500 mg by mouth 2 (two) times daily with a meal.     simvastatin (ZOCOR) 20 MG tablet Take 20 mg by mouth daily at 6 PM.     traMADol  (ULTRAM ) 50 MG tablet Take 1-2 tablets (50-100 mg total) by mouth every 6 (six) hours as needed for moderate pain (pain score 4-6) or severe pain (pain score 7-10). 20 tablet 0   VITAMIN D PO Take by mouth.     No current facility-administered medications for this visit.    REVIEW OF SYSTEMS:   Constitutional: Denies fevers, chills or abnormal night sweats  Eyes: Denies blurriness of vision, double vision or watery eyes Ears, nose, mouth, throat, and face: Denies mucositis or sore throat Respiratory: Denies cough, dyspnea or wheezes Cardiovascular: Denies palpitation, chest discomfort or lower extremity swelling Gastrointestinal:  Denies nausea, heartburn or change in bowel habits Skin: Denies abnormal skin rashes Lymphatics: Denies new lymphadenopathy or easy bruising Neurological:Denies numbness, tingling or new weaknesses Behavioral/Psych: Mood is stable, no new changes  Breast: Denies any palpable lumps or discharge All other systems were reviewed with the patient and are negative.  PHYSICAL EXAMINATION: ECOG PERFORMANCE STATUS: 0 - Asymptomatic  Vitals:   06/13/24 1452  BP: 103/63  Pulse: 71  Resp: 16  Temp: 98 F (36.7 C)  SpO2: 98%    Filed Weights   06/13/24 1452  Weight: 189 lb 1.6 oz (85.8 kg)    GENERAL:alert, no distress and comfortable Chest: CTA bilaterally RRR Abdomen: soft, non tender, non distended, No LE edema.  LABORATORY DATA:  I have reviewed the data as listed Lab Results  Component Value Date   WBC 4.9 01/10/2024   HGB 12.1 01/10/2024   HCT 36.6 01/10/2024   MCV 85.5 01/10/2024   PLT 299 01/10/2024   Lab Results  Component Value Date   NA 141 01/10/2024   K 3.7  01/10/2024   CL 107 01/10/2024   CO2 26 01/10/2024    RADIOGRAPHIC STUDIES: I have personally reviewed the radiological reports and agreed with the findings in the report.  ASSESSMENT AND PLAN:   Assessment and Plan Assessment & Plan Triple positive left breast cancer, post-surgical, on adjuvant therapy  Breast cancer Undergoing radiation therapy, concluding May 21, 2024. Antibody therapy ongoing, ending March 2026. Normal bone density scan in July. - Complete radiation therapy on May 21, 2024. - Initiate anastrozole therapy two weeks post-radiation. - Continue antibody therapy until March 2026. - Order Guardant blood test for recurrence detection on June 04, 2024. - Advise 30-minute daily walking, continue vitamin D and calcium. - Baseline bone density normal.  Time spent: 20 min  All questions were answered. The patient knows to call the clinic with any problems, questions or concerns.    Amber Stalls, MD 06/13/24

## 2024-06-13 NOTE — Progress Notes (Signed)
 No reload today per MD.  Bridgett Leotis Helling, RPH, BCPS, BCOP 06/13/2024 3:33 PM

## 2024-06-13 NOTE — Progress Notes (Signed)
 [facility] CONSULT NOTE  Patient Care Team: [facility] [place] as PCP - General (Family Medicine) Sheena Pugh, DO as PCP - Cardiology (Cardiology) Tyree Nanetta SAILOR, RN as Oncology Nurse Navigator Loretha Ash, MD as Consulting Physician (Hematology and Oncology) Belinda Cough, MD as Consulting Physician (General Surgery) Izell Domino, MD as Attending Physician (Radiation Oncology)  CHIEF COMPLAINTS/PURPOSE OF CONSULTATION:  Newly diagnosed breast cancer  HISTORY OF PRESENTING ILLNESS:  Martha Washington 58 y.o. female is here because of recent diagnosis of left breast cancer  I reviewed her records extensively and collaborated the history with the patient.  SUMMARY OF ONCOLOGIC HISTORY: Oncology History  Malignant neoplasm of upper-outer quadrant of left breast in female, estrogen receptor positive (HCC)  08/04/2023 Mammogram   Further evaluation is suggested for calcifications in the left breast.  There is a group of coarse heterogeneous calcifications associated with an asymmetry in the UPPER OUTER QUADRANT at posterior depth spanning approximately 3.2 x 2.2 x 2.9 cm (AP x transverse x CC). There are linear and branching forms within the large group. There is a possible mass at the posterior margin of the calcifications measuring just under 1 cm in size.   Targeted ultrasound is performed, demonstrating an irregular hypoechoic mass with angular margins and associated calcifications at 2 o'clock 9 cm from the nipple measuring approximately 2.1 x 1.6 x 1.6 cm, demonstrating posterior acoustic shadowing and demonstrating internal power Doppler flow. There is a 0.4 x 0.3 x 0.2 cm satellite mass at 2 o'clock 7 cm from the nipple which is approximately 2 cm removed from the dominant mass.     08/16/2023 Pathology Results     08/22/2023 Initial Diagnosis   Malignant neoplasm of upper-outer quadrant of left breast in female, estrogen  receptor positive (HCC)   08/24/2023 Cancer Staging   Staging form: Breast, AJCC 8th Edition - Clinical stage from 08/24/2023: Stage IB (cT2, cN0, cM0, G2, ER+, PR+, HER2+) - Signed by Loretha Ash, MD on 08/24/2023 Stage prefix: Initial diagnosis Histologic grading system: 3 grade system Laterality: Left Staged by: Pathologist and managing physician Stage used in treatment planning: Yes National guidelines used in treatment planning: Yes Type of national guideline used in treatment planning: NCCN   08/31/2023 Genetic Testing   Negative [facility] CancerNext+RNAinsight Panel.  Report date is 08/31/2023.   The [employer] CancerNext+RNAinsight Panel includes sequencing, rearrangement analysis, and RNA analysis for the following 39 genes: APC, ATM, BAP1, BARD1, BMPR1A, BRCA1, BRCA2, BRIP1, CDH1, CDKN2A, CHEK2, FH, FLCN, MET, MLH1, MSH2, MSH6, MUTYH, NF1, NTHL1, PALB2, PMS2, PTEN, RAD51C, RAD51D, SMAD4, STK11, TP53, TSC1, TSC2, and VHL (sequencing and deletion/duplication); AXIN2, HOXB13, MBD4, MSH3, POLD1 and POLE (sequencing only); EPCAM and GREM1 (deletion/duplication only).    09/16/2023 - 01/13/2024 Chemotherapy   Patient is on Treatment Plan : BREAST  Docetaxel  + Carboplatin  + Trastuzumab  + Pertuzumab   (TCHP) q21d      04/02/2024 -  Chemotherapy   Patient is on Treatment Plan : BREAST Trastuzumab  + Pertuzumab  q21d x 11 cycles      Discussed the use of AI scribe software for clinical note transcription with the patient, who gave verbal consent to proceed. History of Present Illness  Martha Washington is a 58 year old female with breast cancer who presents for follow-up after completing radiation therapy.  She has completed her radiation therapy and is due to start anastrozole. She has not yet started the medication, as she believed it was to be initiated a  month after completing radiation.  Her recent Guardant reveal , no detectable ctDNA   Her last echocardiogram showed a 60% ejection fraction,  consistent with previous results.  She mentions that she has been walking, primarily while caring for her grandchild, but acknowledges that this may not be sufficient exercise.  Rest of the pertinent 10 point ROS reviewed and neg.  MEDICAL HISTORY:  Past Medical History:  Diagnosis Date   Atrial fibrillation with RVR (HCC)    GERD (gastroesophageal reflux disease)    no meds   High cholesterol    Menorrhagia 07/13/2013   Missed abortion    resolved - no surgery required   New onset atrial fibrillation (HCC) 08/04/2017   with RVR to the 160s; S/P hammertoe OR/notes 08/04/2017   S/P bunionectomy 08/04/2017   SVD (spontaneous vaginal delivery) X 2   Tachycardia 08/04/2017    SURGICAL HISTORY: Past Surgical History:  Procedure Laterality Date   ABDOMINAL HYSTERECTOMY     AXILLARY SENTINEL NODE BIOPSY Left 03/07/2024   Procedure: BIOPSY, LYMPH NODE, SENTINEL, AXILLARY;  Surgeon: Belinda Cough, MD;  Location: [facility];  Service: General;  Laterality: Left;  LEFT BREAST RADIOACTIVE SEED LOCALIZED LUMPECTOMY LEFT AXILLARY DEEP SENTINEL LYMPH NODE BIOPSY LMA w/PEC BLOCK   BACK SURGERY     BREAST BIOPSY Left 08/16/2023   US  LT BREAST BX W LOC DEV 1ST LESION IMG BX SPEC US  GUIDE 08/16/2023 [facility] MAMMOGRAP[facility]   BREAST BIOPSY Left 09/12/2023   US  LT BREAST BX W LOC DEV 1ST LESION IMG BX SPEC US  GUIDE 09/12/2023 [facility] MAMMOGRAP[facility]   BREAST BIOPSY  03/06/2024   MM LT RADIOACTIVE SEED LOC MAMMO GUIDE 03/06/2024 [facility] MAMMOGRAPHY   BREAST LUMPECTOMY WITH RADIOACTIVE SEED LOCALIZATION Left 03/07/2024   Procedure: BREAST LUMPECTOMY WITH RADIOACTIVE SEED LOCALIZATION;  Surgeon: Belinda Cough, MD;  Location: [facility];  Service: General;  Laterality: Left;   BUNIONECTOMY WITH HAMMERTOE RECONSTRUCTION Right 03/2017   BUNIONECTOMY WITH HAMMERTOE RECONSTRUCTION Left 08/04/2017   BUNIONECTOMY WITH HAMMERTOE RECONSTRUCTION Left 08/04/2017   Procedure: Left modified McBride and  Lapidus bunion corrections; Left 2-3 Weil and hammertoe corrections;  Surgeon: Kit Rush, MD;  Location: [facility];  Service: Orthopedics;  Laterality: Left;   PORTACATH PLACEMENT Right 09/15/2023   Procedure: INSERTION PORT-A-CATH WITH ULTASOUND GUIDANCE;  Surgeon: Belinda Cough, MD;  Location: [facility];  Service: General;  Laterality: Right;  Leave port accessed   ROBOTIC ASSISTED TOTAL HYSTERECTOMY WITH BILATERAL SALPINGO OOPHERECTOMY N/A 07/13/2013   Procedure: ROBOTIC ASSISTED TOTAL HYSTERECTOMY WITH BILATERAL SALPINGECTOMY;  Surgeon: Nena DELENA App, MD;  Location: [facility];  Service: Gynecology;  Laterality: N/A;   SHOULDER ARTHROSCOPY W/ ROTATOR CUFF REPAIR Right 2010   SHOULDER ARTHROSCOPY WITH ROTATOR CUFF REPAIR Right 10/17/2014   Procedure: SHOULDER ARTHROSCOPY WITH ROTATOR CUFF REPAIR;  Surgeon: Toribio Chancy, MD;  Location: [facility];  Service: Orthopedics;  Laterality: Right;   TUBAL LIGATION     WEIL OSTEOTOMY Left 08/04/2017   Procedure: Left 2nd and 3rd weil osteotomy;  Surgeon: Kit Rush, MD;  Location: [facility];  Service: Orthopedics;  Laterality: Left;   WISDOM TOOTH EXTRACTION      SOCIAL HISTORY: Social History   Socioeconomic History   Marital status: Married    Spouse name: Oneil   Number of children: Not on file   Years of education: Not on file   Highest education level: Not on file  Occupational History   Not  on file  Tobacco Use   Smoking status: Never   Smokeless tobacco: Never  Vaping Use   Vaping status: Never Used  Substance and Sexual Activity   Alcohol use: No   Drug use: No   Sexual activity: Not on file  Other Topics Concern   Not on file  Social History Narrative   Lives with husband   Right Handed   Drinks 1 cup caffeine daily   Social Drivers of Health   Financial Resource Strain: Not on file  Food Insecurity: No Food Insecurity (04/06/2024)   Hunger Vital Sign     Worried About Running Out of Food in the Last Year: Never true    Ran Out of Food in the Last Year: Never true  Transportation Needs: No Transportation Needs (04/06/2024)   PRAPARE - Administrator, Civil Service (Medical): No    Lack of Transportation (Non-Medical): No  Physical Activity: Not on file  Stress: Not on file  Social Connections: Unknown (12/22/2021)   Received from [facility]   Social Network    Social Network: Not on file  Intimate Partner Violence: Not At Risk (04/06/2024)   Humiliation, Afraid, Rape, and Kick questionnaire    Fear of Current or Ex-Partner: No    Emotionally Abused: No    Physically Abused: No    Sexually Abused: No    FAMILY HISTORY: Family History  Problem Relation Age of Onset   Hypertension Mother    Hypertension Father    Bone cancer Brother 73       or other primary?    ALLERGIES:  is allergic to azithromycin, morphine and codeine, and oxycodone .  MEDICATIONS:  Current Outpatient Medications  Medication Sig Dispense Refill   anastrozole (ARIMIDEX) 1 MG tablet Take 1 tablet (1 mg total) by mouth daily. 90 tablet 3   celecoxib (CELEBREX) 100 MG capsule Take 100 mg by mouth daily.     dipyridamole (PERSANTINE) 75 MG tablet Take 75 mg by mouth in the morning and at bedtime.     doxycycline (MONODOX) 100 MG capsule Take 100 mg by mouth 2 (two) times daily.     escitalopram  (LEXAPRO ) 10 MG tablet Take 1 tablet (10 mg total) by mouth daily. 90 tablet 0   Hydroxychloroquine Sulfate 100 MG TABS Take 100 mg by mouth in the morning and at bedtime.     lidocaine -prilocaine  (EMLA ) cream Apply 1 Application topically once.     metFORMIN (GLUCOPHAGE) 500 MG tablet Take 500 mg by mouth 2 (two) times daily with a meal.     simvastatin (ZOCOR) 20 MG tablet Take 20 mg by mouth daily at 6 PM.     traMADol  (ULTRAM ) 50 MG tablet Take 1-2 tablets (50-100 mg total) by mouth every 6 (six) hours as needed for moderate pain (pain score 4-6) or severe  pain (pain score 7-10). 20 tablet 0   VITAMIN D PO Take by mouth.     No current facility-administered medications for this visit.    REVIEW OF SYSTEMS:   Constitutional: Denies fevers, chills or abnormal night sweats Eyes: Denies blurriness of vision, double vision or watery eyes Ears, nose, mouth, throat, and face: Denies mucositis or sore throat Respiratory: Denies cough, dyspnea or wheezes Cardiovascular: Denies palpitation, chest discomfort or lower extremity swelling Gastrointestinal:  Denies nausea, heartburn or change in bowel habits Skin: Denies abnormal skin rashes Lymphatics: Denies new lymphadenopathy or easy bruising Neurological:Denies numbness, tingling or new weaknesses Behavioral/Psych: Mood is stable,  no new changes  Breast: Denies any palpable lumps or discharge All other systems were reviewed with the patient and are negative.  PHYSICAL EXAMINATION: ECOG PERFORMANCE STATUS: 0 - Asymptomatic  Vitals:   06/13/24 1452  BP: 103/63  Pulse: 71  Resp: 16  Temp: 98 F (36.7 C)  SpO2: 98%    Filed Weights   06/13/24 1452  Weight: 189 lb 1.6 oz (85.8 kg)    GENERAL:alert, no distress and comfortable Chest: CTA bilaterally RRR Abdomen: soft, non tender, non distended, No LE edema.  LABORATORY DATA:  I have reviewed the data as listed Lab Results  Component Value Date   WBC 4.9 01/10/2024   HGB 12.1 01/10/2024   HCT 36.6 01/10/2024   MCV 85.5 01/10/2024   PLT 299 01/10/2024   Lab Results  Component Value Date   NA 141 01/10/2024   K 3.7 01/10/2024   CL 107 01/10/2024   CO2 26 01/10/2024    RADIOGRAPHIC STUDIES: I have personally reviewed the radiological reports and agreed with the findings in the report.  ASSESSMENT AND PLAN:   Assessment and Plan Assessment & Plan Triple positive left breast cancer, post-surgical, on adjuvant therapy Echocardiogram shows stable 60% ejection fraction. - Start anastrozole therapy for adj antiestrogen  therapy - Continue HP maintenance, last dose in early April - Scheduled echocardiogram for December. Most recent ECHO with stable EF. - Adjusted follow-up appointments with me to every other visit. - Baseline bone density normal.  Time spent: 20 min  All questions were answered. The patient knows to call the clinic with any problems, questions or concerns.    Amber Stalls, MD 06/13/24

## 2024-06-13 NOTE — Progress Notes (Signed)
Patient declined post pertuzumab observation.  Tolerated treatment well without incident.  VSS at discharge.  Ambulated to lobby.

## 2024-06-14 LAB — GUARDANT REVEAL

## 2024-06-19 ENCOUNTER — Encounter: Payer: Self-pay | Admitting: *Deleted

## 2024-06-19 DIAGNOSIS — E860 Dehydration: Secondary | ICD-10-CM

## 2024-06-20 ENCOUNTER — Other Ambulatory Visit: Payer: Self-pay | Admitting: Hematology and Oncology

## 2024-06-20 DIAGNOSIS — C50412 Malignant neoplasm of upper-outer quadrant of left female breast: Secondary | ICD-10-CM

## 2024-06-21 ENCOUNTER — Ambulatory Visit
Admission: RE | Admit: 2024-06-21 | Discharge: 2024-06-21 | Disposition: A | Discharge status: Home / Self Care | Source: Ambulatory Visit | Attending: Radiation Oncology | Admitting: Radiation Oncology

## 2024-06-21 DIAGNOSIS — C50412 Malignant neoplasm of upper-outer quadrant of left female breast: Secondary | ICD-10-CM

## 2024-07-03 ENCOUNTER — Encounter: Payer: Self-pay | Admitting: Hematology and Oncology

## 2024-07-04 ENCOUNTER — Inpatient Hospital Stay: Admitting: Hematology and Oncology

## 2024-07-04 ENCOUNTER — Inpatient Hospital Stay

## 2024-07-05 ENCOUNTER — Other Ambulatory Visit: Payer: Self-pay

## 2024-07-11 ENCOUNTER — Inpatient Hospital Stay: Admitting: Hematology and Oncology

## 2024-07-11 ENCOUNTER — Inpatient Hospital Stay: Attending: Hematology and Oncology

## 2024-07-11 VITALS — BP 109/74 | HR 65 | Temp 98.5°F | Resp 18 | Wt 190.5 lb

## 2024-07-11 DIAGNOSIS — E860 Dehydration: Secondary | ICD-10-CM

## 2024-07-11 DIAGNOSIS — C50412 Malignant neoplasm of upper-outer quadrant of left female breast: Secondary | ICD-10-CM | POA: Diagnosis present

## 2024-07-11 DIAGNOSIS — Z5112 Encounter for antineoplastic immunotherapy: Secondary | ICD-10-CM | POA: Insufficient documentation

## 2024-07-11 MED ORDER — ACETAMINOPHEN 325 MG PO TABS
650.0000 mg | ORAL_TABLET | Freq: Once | ORAL | Status: AC
  Administered 2024-07-11: 650 mg via ORAL
  Filled 2024-07-11: qty 2

## 2024-07-11 MED ORDER — SODIUM CHLORIDE 0.9 % IV SOLN: INTRAVENOUS | Status: DC

## 2024-07-11 MED ORDER — TRASTUZUMAB-ANNS CHEMO 150 MG IV SOLR
6.0000 mg/kg | Freq: Once | INTRAVENOUS | Status: AC
  Administered 2024-07-11: 525 mg via INTRAVENOUS
  Filled 2024-07-11: qty 25

## 2024-07-11 MED ORDER — SODIUM CHLORIDE 0.9 % IV SOLN
420.0000 mg | Freq: Once | INTRAVENOUS | Status: AC
  Administered 2024-07-11: 420 mg via INTRAVENOUS
  Filled 2024-07-11: qty 14

## 2024-07-11 NOTE — Patient Instructions (Signed)
 [facility] - A DEPT OF [facility]  Discharge Instructions: Thank you for choosing [facility] to provide your oncology and hematology care.   If you have a lab appointment with the Cancer Center, please go directly to the Cancer Center and check in at the registration area.   Wear comfortable clothing and clothing appropriate for easy access to any Portacath or PICC line.   We strive to give you quality time with your provider. You may need to reschedule your appointment if you arrive late (15 or more minutes).  Arriving late affects you and other patients whose appointments are after yours.  Also, if you miss three or more appointments without notifying the office, you may be dismissed from the clinic at the provider's discretion.      For prescription refill requests, have your pharmacy contact our office and allow 72 hours for refills to be completed.    Today you received the following chemotherapy and/or immunotherapy agents:  pertuzumab  (PERJETA )  trastuzumab -anns (KANJINTI )    To help prevent nausea and vomiting after your treatment, we encourage you to take your nausea medication as directed.  BELOW ARE SYMPTOMS THAT SHOULD BE REPORTED IMMEDIATELY: *FEVER GREATER THAN 100.4 F (38 C) OR HIGHER *CHILLS OR SWEATING *NAUSEA AND VOMITING THAT IS NOT CONTROLLED WITH YOUR NAUSEA MEDICATION *UNUSUAL SHORTNESS OF BREATH *UNUSUAL BRUISING OR BLEEDING *URINARY PROBLEMS (pain or burning when urinating, or frequent urination) *BOWEL PROBLEMS (unusual diarrhea, constipation, pain near the anus) TENDERNESS IN MOUTH AND THROAT WITH OR WITHOUT PRESENCE OF ULCERS (sore throat, sores in mouth, or a toothache) UNUSUAL RASH, SWELLING OR PAIN  UNUSUAL VAGINAL DISCHARGE OR ITCHING   Items with * indicate a potential emergency and should be followed up as soon as possible or go to the Emergency Department if any problems should occur.  Please show the  CHEMOTHERAPY ALERT CARD or IMMUNOTHERAPY ALERT CARD at check-in to the Emergency Department and triage nurse.  Should you have questions after your visit or need to cancel or reschedule your appointment, please contact [facility] - [facility] DEPT [facility]  Dept: [phone]  and follow the prompts.  Office hours are 8:00 a.m. to 4:30 p.m. Monday - Friday. Please note that voicemails left after 4:00 p.m. may not be returned until the following business day.  We are closed weekends and major holidays. You have access to a nurse at all times for urgent questions. Please call the main number to the clinic Dept: [phone] and follow the prompts.   For any non-urgent questions, you may also contact your provider using MyChart. We now offer e-Visits for anyone 70 and older to request care online for non-urgent symptoms. For details visit [email or web address].   Also download the MyChart app! Go to the app store, search MyChart, open the app, select [place], and log in with your MyChart username and password.

## 2024-07-19 ENCOUNTER — Ambulatory Visit (HOSPITAL_COMMUNITY)
Admission: RE | Admit: 2024-07-19 | Discharge: 2024-07-19 | Disposition: A | Discharge status: Home / Self Care | Source: Ambulatory Visit | Attending: Hematology and Oncology | Admitting: Hematology and Oncology

## 2024-07-19 DIAGNOSIS — Z170 Estrogen receptor positive status [ER+]: Secondary | ICD-10-CM

## 2024-07-19 DIAGNOSIS — Z79899 Other long term (current) drug therapy: Secondary | ICD-10-CM | POA: Diagnosis not present

## 2024-07-19 LAB — ECHOCARDIOGRAM COMPLETE
Area-P 1/2: 3.34 cm2
Calc EF: 54.9 %
S' Lateral: 2.9 cm
Single Plane A2C EF: 57.5 %
Single Plane A4C EF: 54.4 %

## 2024-07-24 ENCOUNTER — Other Ambulatory Visit: Payer: Self-pay | Admitting: Hematology and Oncology

## 2024-07-25 ENCOUNTER — Other Ambulatory Visit: Payer: Self-pay | Admitting: Pharmacist

## 2024-07-25 ENCOUNTER — Inpatient Hospital Stay

## 2024-07-25 ENCOUNTER — Inpatient Hospital Stay: Admitting: Hematology and Oncology

## 2024-07-25 DIAGNOSIS — C50412 Malignant neoplasm of upper-outer quadrant of left female breast: Secondary | ICD-10-CM

## 2024-08-01 ENCOUNTER — Inpatient Hospital Stay

## 2024-08-15 ENCOUNTER — Inpatient Hospital Stay: Attending: Hematology and Oncology | Admitting: Hematology and Oncology

## 2024-08-15 ENCOUNTER — Inpatient Hospital Stay

## 2024-08-15 VITALS — BP 119/71 | HR 71 | Temp 97.6°F | Resp 18 | Wt 188.1 lb

## 2024-08-15 VITALS — BP 106/70 | HR 63 | Temp 97.7°F | Resp 16

## 2024-08-15 DIAGNOSIS — I4891 Unspecified atrial fibrillation: Secondary | ICD-10-CM | POA: Insufficient documentation

## 2024-08-15 DIAGNOSIS — Z7984 Long term (current) use of oral hypoglycemic drugs: Secondary | ICD-10-CM | POA: Insufficient documentation

## 2024-08-15 DIAGNOSIS — Z5112 Encounter for antineoplastic immunotherapy: Secondary | ICD-10-CM | POA: Diagnosis not present

## 2024-08-15 DIAGNOSIS — Z791 Long term (current) use of non-steroidal anti-inflammatories (NSAID): Secondary | ICD-10-CM | POA: Diagnosis not present

## 2024-08-15 DIAGNOSIS — Z17410 Hormone receptor positive with human epidermal growth factor receptor 2 positive status: Secondary | ICD-10-CM | POA: Diagnosis not present

## 2024-08-15 DIAGNOSIS — Z79620 Long term (current) use of immunosuppressive biologic: Secondary | ICD-10-CM | POA: Insufficient documentation

## 2024-08-15 DIAGNOSIS — Z170 Estrogen receptor positive status [ER+]: Secondary | ICD-10-CM | POA: Diagnosis not present

## 2024-08-15 DIAGNOSIS — C50412 Malignant neoplasm of upper-outer quadrant of left female breast: Secondary | ICD-10-CM | POA: Diagnosis not present

## 2024-08-15 DIAGNOSIS — N920 Excessive and frequent menstruation with regular cycle: Secondary | ICD-10-CM | POA: Diagnosis not present

## 2024-08-15 DIAGNOSIS — Z809 Family history of malignant neoplasm, unspecified: Secondary | ICD-10-CM | POA: Diagnosis not present

## 2024-08-15 DIAGNOSIS — Z79811 Long term (current) use of aromatase inhibitors: Secondary | ICD-10-CM | POA: Insufficient documentation

## 2024-08-15 DIAGNOSIS — K219 Gastro-esophageal reflux disease without esophagitis: Secondary | ICD-10-CM | POA: Insufficient documentation

## 2024-08-15 DIAGNOSIS — Z79899 Other long term (current) drug therapy: Secondary | ICD-10-CM | POA: Diagnosis not present

## 2024-08-15 DIAGNOSIS — E7800 Pure hypercholesterolemia, unspecified: Secondary | ICD-10-CM | POA: Diagnosis not present

## 2024-08-15 DIAGNOSIS — E860 Dehydration: Secondary | ICD-10-CM

## 2024-08-15 MED ORDER — SODIUM CHLORIDE 0.9% FLUSH: 10.0000 mL | INTRAVENOUS | Status: DC | PRN

## 2024-08-15 MED ORDER — TRASTUZUMAB-ANNS CHEMO 150 MG IV SOLR
6.0000 mg/kg | Freq: Once | INTRAVENOUS | Status: AC
  Administered 2024-08-15: 525 mg via INTRAVENOUS
  Filled 2024-08-15: qty 25

## 2024-08-15 MED ORDER — ACETAMINOPHEN 325 MG PO TABS
650.0000 mg | ORAL_TABLET | Freq: Once | ORAL | Status: AC
  Administered 2024-08-15: 650 mg via ORAL
  Filled 2024-08-15: qty 2

## 2024-08-15 MED ORDER — SODIUM CHLORIDE 0.9 % IV SOLN
420.0000 mg | Freq: Once | INTRAVENOUS | Status: AC
  Administered 2024-08-15: 420 mg via INTRAVENOUS
  Filled 2024-08-15: qty 14

## 2024-08-15 MED ORDER — SODIUM CHLORIDE 0.9 % IV SOLN: INTRAVENOUS | Status: DC

## 2024-08-15 NOTE — Patient Instructions (Addendum)
 [facility] - A DEPT OF [facility]  Discharge Instructions: Thank you for choosing [facility] to provide your oncology and hematology care.   If you have a lab appointment with the Cancer Center, please go directly to the Cancer Center and check in at the registration area.   Wear comfortable clothing and clothing appropriate for easy access to any Portacath or PICC line.   We strive to give you quality time with your provider. You may need to reschedule your appointment if you arrive late (15 or more minutes).  Arriving late affects you and other patients whose appointments are after yours.  Also, if you miss three or more appointments without notifying the office, you may be dismissed from the clinic at the providers discretion.      For prescription refill requests, have your pharmacy contact our office and allow 72 hours for refills to be completed.    Today you received the following chemotherapy and/or immunotherapy agents Trastuzumab -anns, Pertuzumab       To help prevent nausea and vomiting after your treatment, we encourage you to take your nausea medication as directed.  BELOW ARE SYMPTOMS THAT SHOULD BE REPORTED IMMEDIATELY: *FEVER GREATER THAN 100.4 F (38 C) OR HIGHER *CHILLS OR SWEATING *NAUSEA AND VOMITING THAT IS NOT CONTROLLED WITH YOUR NAUSEA MEDICATION *UNUSUAL SHORTNESS OF BREATH *UNUSUAL BRUISING OR BLEEDING *URINARY PROBLEMS (pain or burning when urinating, or frequent urination) *BOWEL PROBLEMS (unusual diarrhea, constipation, pain near the anus) TENDERNESS IN MOUTH AND THROAT WITH OR WITHOUT PRESENCE OF ULCERS (sore throat, sores in mouth, or a toothache) UNUSUAL RASH, SWELLING OR PAIN  UNUSUAL VAGINAL DISCHARGE OR ITCHING   Items with * indicate a potential emergency and should be followed up as soon as possible or go to the Emergency Department if any problems should occur.  Please show the CHEMOTHERAPY ALERT CARD  or IMMUNOTHERAPY ALERT CARD at check-in to the Emergency Department and triage nurse.  Should you have questions after your visit or need to cancel or reschedule your appointment, please contact [facility]  Dept: [phone]  and follow the prompts.  Office hours are 8:00 a.m. to 4:30 p.m. Monday - Friday. Please note that voicemails left after 4:00 p.m. may not be returned until the following business day.  We are closed weekends and major holidays. You have access to a nurse at all times for urgent questions. Please call the main number to the clinic Dept: [phone] and follow the prompts.   For any non-urgent questions, you may also contact your provider using MyChart. We now offer e-Visits for anyone 16 and older to request care online for non-urgent symptoms. For details visit [email or web address].   Also download the MyChart app! Go to the app store, search MyChart, open the app, select [place], and log in with your MyChart username and password.

## 2024-08-15 NOTE — Progress Notes (Signed)
 [facility] CONSULT NOTE  Patient Care Team: [facility], [facility] [place] as PCP - General (Family Medicine) Sheena Pugh, DO as PCP - Cardiology (Cardiology) Tyree Nanetta SAILOR, RN as Oncology Nurse Navigator Loretha Ash, MD as Consulting Physician (Hematology and Oncology) Belinda Cough, MD as Consulting Physician (General Surgery) Izell Domino, MD as Attending Physician (Radiation Oncology)  CHIEF COMPLAINTS/PURPOSE OF CONSULTATION:  Newly diagnosed breast cancer  HISTORY OF PRESENTING ILLNESS:  Martha Washington 59 y.o. female is here because of recent diagnosis of left breast cancer  I reviewed her records extensively and collaborated the history with the patient.  SUMMARY OF ONCOLOGIC HISTORY: Oncology History  Malignant neoplasm of upper-outer quadrant of left breast in female, estrogen receptor positive (HCC)  08/04/2023 Mammogram   Further evaluation is suggested for calcifications in the left breast.  There is a group of coarse heterogeneous calcifications associated with an asymmetry in the UPPER OUTER QUADRANT at posterior depth spanning approximately 3.2 x 2.2 x 2.9 cm (AP x transverse x CC). There are linear and branching forms within the large group. There is a possible mass at the posterior margin of the calcifications measuring just under 1 cm in size.   Targeted ultrasound is performed, demonstrating an irregular hypoechoic mass with angular margins and associated calcifications at 2 o'clock 9 cm from the nipple measuring approximately 2.1 x 1.6 x 1.6 cm, demonstrating posterior acoustic shadowing and demonstrating internal power Doppler flow. There is a 0.4 x 0.3 x 0.2 cm satellite mass at 2 o'clock 7 cm from the nipple which is approximately 2 cm removed from the dominant mass.     08/16/2023 Pathology Results     08/22/2023 Initial Diagnosis   Malignant neoplasm of upper-outer quadrant of left breast in female, estrogen  receptor positive (HCC)   08/24/2023 Cancer Staging   Staging form: Breast, AJCC 8th Edition - Clinical stage from 08/24/2023: Stage IB (cT2, cN0, cM0, G2, ER+, PR+, HER2+) - Signed by Loretha Ash, MD on 08/24/2023 Stage prefix: Initial diagnosis Histologic grading system: 3 grade system Laterality: Left Staged by: Pathologist and managing physician Stage used in treatment planning: Yes National guidelines used in treatment planning: Yes Type of national guideline used in treatment planning: NCCN   08/31/2023 Genetic Testing   Negative [facility] CancerNext+RNAinsight Panel.  Report date is 08/31/2023.   The [employer] CancerNext+RNAinsight Panel includes sequencing, rearrangement analysis, and RNA analysis for the following 39 genes: APC, ATM, BAP1, BARD1, BMPR1A, BRCA1, BRCA2, BRIP1, CDH1, CDKN2A, CHEK2, FH, FLCN, MET, MLH1, MSH2, MSH6, MUTYH, NF1, NTHL1, PALB2, PMS2, PTEN, RAD51C, RAD51D, SMAD4, STK11, TP53, TSC1, TSC2, and VHL (sequencing and deletion/duplication); AXIN2, HOXB13, MBD4, MSH3, POLD1 and POLE (sequencing only); EPCAM and GREM1 (deletion/duplication only).    09/16/2023 - 01/13/2024 Chemotherapy   Patient is on Treatment Plan : BREAST  Docetaxel  + Carboplatin  + Trastuzumab  + Pertuzumab   (TCHP) q21d      04/02/2024 -  Chemotherapy   Patient is on Treatment Plan : BREAST Trastuzumab  + Pertuzumab  q21d x 11 cycles      Discussed the use of AI scribe software for clinical note transcription with the patient, who gave verbal consent to proceed. History of Present Illness  Martha Washington is a 59 year old female with HER2-positive breast cancer who presents for ongoing management and discussion of treatment scheduling.  She is receiving maintenance therapy with trastuzumab  and pertuzumab . Her infusion schedule was recently changed to every 28 days, rather than the standard 21-day interval, due to  her [employer] cancer insurance requirements which only pay her for treatments done once a month.  She has not experienced any new symptoms or adverse effects from her ongoing infusions.  She has not yet initiated anastrozole , electing to delay therapy until after recent travel due to concerns about starting a new medication while away. She denies new symptoms, including abdominal pain, and reports normal bowel function. She has no cardiac symptoms.  Rest of the pertinent 10 point ROS reviewed and neg.  MEDICAL HISTORY:  Past Medical History:  Diagnosis Date   Atrial fibrillation with RVR (HCC)    GERD (gastroesophageal reflux disease)    no meds   High cholesterol    Menorrhagia 07/13/2013   Missed abortion    resolved - no surgery required   New onset atrial fibrillation (HCC) 08/04/2017   with RVR to the 160s; S/P hammertoe OR/notes 08/04/2017   S/P bunionectomy 08/04/2017   SVD (spontaneous vaginal delivery) X 2   Tachycardia 08/04/2017    SURGICAL HISTORY: Past Surgical History:  Procedure Laterality Date   ABDOMINAL HYSTERECTOMY     AXILLARY SENTINEL NODE BIOPSY Left 03/07/2024   Procedure: BIOPSY, LYMPH NODE, SENTINEL, AXILLARY;  Surgeon: Belinda Cough, MD;  Location: [facility];  Service: General;  Laterality: Left;  LEFT BREAST RADIOACTIVE SEED LOCALIZED LUMPECTOMY LEFT AXILLARY DEEP SENTINEL LYMPH NODE BIOPSY LMA w/PEC BLOCK   BACK SURGERY     BREAST BIOPSY Left 08/16/2023   US  LT BREAST BX W LOC DEV 1ST LESION IMG BX SPEC US  GUIDE 08/16/2023 [facility] M[facility]MOGRAP[facility]   BREAST BIOPSY Left 09/12/2023   US  LT BREAST BX W LOC DEV 1ST LESION IMG BX SPEC US  GUIDE 09/12/2023 [facility] MAMMOGRAPHY   BREAST BIOPSY  03/06/2024   MM LT RADIOACTIVE SEED LOC MAMMO GUIDE 03/06/2024 [facility] MAMMOGRAPHY   BREAST LUMPECTOMY WITH RADIOACTIVE SEED LOCALIZATION Left 03/07/2024   Procedure: BREAST LUMPECTOMY WITH RADIOACTIVE SEED LOCALIZATION;  Surgeon: Belinda Cough, MD;  Location: [facility];  Service: General;  Laterality: Left;   BUNIONECTOMY WITH HAMMERTOE RECONSTRUCTION  Right 03/2017   BUNIONECTOMY WITH HAMMERTOE RECONSTRUCTION Left 08/04/2017   BUNIONECTOMY WITH HAMMERTOE RECONSTRUCTION Left 08/04/2017   Procedure: Left modified McBride and Lapidus bunion corrections; Left 2-3 Weil and hammertoe corrections;  Surgeon: Kit Rush, MD;  Location: [facility];  Service: Orthopedics;  Laterality: Left;   PORTACATH PLACEMENT Right 09/15/2023   Procedure: INSERTION PORT-A-CATH WITH ULTASOUND GUIDANCE;  Surgeon: Belinda Cough, MD;  Location: [facility];  Service: General;  Laterality: Right;  Leave port accessed   ROBOTIC ASSISTED TOTAL HYSTERECTOMY WITH BILATERAL SALPINGO OOPHERECTOMY N/A 07/13/2013   Procedure: ROBOTIC ASSISTED TOTAL HYSTERECTOMY WITH BILATERAL SALPINGECTOMY;  Surgeon: Nena DELENA App, MD;  Location: [facility];  Service: Gynecology;  Laterality: N/A;   SHOULDER ARTHROSCOPY W/ ROTATOR CUFF REPAIR Right 2010   SHOULDER ARTHROSCOPY WITH ROTATOR CUFF REPAIR Right 10/17/2014   Procedure: SHOULDER ARTHROSCOPY WITH ROTATOR CUFF REPAIR;  Surgeon: Toribio Chancy, MD;  Location: [facility];  Service: Orthopedics;  Laterality: Right;   TUBAL LIGATION     WEIL OSTEOTOMY Left 08/04/2017   Procedure: Left 2nd and 3rd weil osteotomy;  Surgeon: Kit Rush, MD;  Location:  SURGERY CENTER;  Service: Orthopedics;  Laterality: Left;   WISDOM TOOTH EXTRACTION      SOCIAL HISTORY: Social History   Socioeconomic History   Marital status: Married    Spouse name: Oneil   Number of children: Not on file  Years of education: Not on file   Highest education level: Not on file  Occupational History   Not on file  Tobacco Use   Smoking status: Never   Smokeless tobacco: Never  Vaping Use   Vaping status: Never Used  Substance and Sexual Activity   Alcohol use: No   Drug use: No   Sexual activity: Not on file  Other Topics Concern   Not on file  Social History Narrative   Lives with husband   Right Handed    Drinks 1 cup caffeine daily   Social Drivers of Health   Tobacco Use: Low Risk (04/06/2024)   Patient History    Smoking Tobacco Use: Never    Smokeless Tobacco Use: Never    Passive Exposure: Not on file  Financial Resource Strain: Not on file  Food Insecurity: No Food Insecurity (04/06/2024)   Epic    Worried About Programme Researcher, Broadcasting/film/video in the Last Year: Never true    Ran Out of Food in the Last Year: Never true  Transportation Needs: No Transportation Needs (04/06/2024)   Epic    Lack of Transportation (Medical): No    Lack of Transportation (Non-Medical): No  Physical Activity: Not on file  Stress: Not on file  Social Connections: Unknown (12/22/2021)   Received from [facility]   Social Network    Social Network: Not on file  Intimate Partner Violence: Not At Risk (04/06/2024)   Epic    Fear of Current or Ex-Partner: No    Emotionally Abused: No    Physically Abused: No    Sexually Abused: No  Depression (PHQ2-9): Low Risk (07/11/2024)   Depression (PHQ2-9)    PHQ-2 Score: 0  Alcohol Screen: Not on file  Housing: Low Risk (04/06/2024)   Epic    Unable to Pay for Housing in the Last Year: No    Number of Times Moved in the Last Year: 0    Homeless in the Last Year: No  Utilities: Not At Risk (04/06/2024)   Epic    Threatened with loss of utilities: No  Health Literacy: Not on file    FAMILY HISTORY: Family History  Problem Relation Age of Onset   Hypertension Mother    Hypertension Father    Bone cancer Brother 38       or other primary?    ALLERGIES:  is allergic to azithromycin, morphine and codeine, and oxycodone .  MEDICATIONS:  Current Outpatient Medications  Medication Sig Dispense Refill   anastrozole  (ARIMIDEX ) 1 MG tablet Take 1 tablet (1 mg total) by mouth daily. 90 tablet 3   celecoxib (CELEBREX) 100 MG capsule Take 100 mg by mouth daily.     dipyridamole (PERSANTINE) 75 MG tablet Take 75 mg by mouth in the morning and at bedtime.     doxycycline  (MONODOX) 100 MG capsule Take 100 mg by mouth 2 (two) times daily.     escitalopram  (LEXAPRO ) 10 MG tablet Take 1 tablet (10 mg total) by mouth daily. 90 tablet 0   Hydroxychloroquine Sulfate 100 MG TABS Take 100 mg by mouth in the morning and at bedtime.     lidocaine -prilocaine  (EMLA ) cream Apply 1 Application topically once.     metFORMIN (GLUCOPHAGE) 500 MG tablet Take 500 mg by mouth 2 (two) times daily with a meal.     simvastatin (ZOCOR) 20 MG tablet Take 20 mg by mouth daily at 6 PM.     traMADol  (ULTRAM ) 50 MG tablet Take  1-2 tablets (50-100 mg total) by mouth every 6 (six) hours as needed for moderate pain (pain score 4-6) or severe pain (pain score 7-10). 20 tablet 0   VITAMIN D PO Take by mouth.     No current facility-administered medications for this visit.    REVIEW OF SYSTEMS:   Constitutional: Denies fevers, chills or abnormal night sweats Eyes: Denies blurriness of vision, double vision or watery eyes Ears, nose, mouth, throat, and face: Denies mucositis or sore throat Respiratory: Denies cough, dyspnea or wheezes Cardiovascular: Denies palpitation, chest discomfort or lower extremity swelling Gastrointestinal:  Denies nausea, heartburn or change in bowel habits Skin: Denies abnormal skin rashes Lymphatics: Denies new lymphadenopathy or easy bruising Neurological:Denies numbness, tingling or new weaknesses Behavioral/Psych: Mood is stable, no new changes  Breast: Denies any palpable lumps or discharge All other systems were reviewed with the patient and are negative.  PHYSICAL EXAMINATION: ECOG PERFORMANCE STATUS: 0 - Asymptomatic  BP 119/71 (Patient Position: Sitting)   Pulse 71   Temp 97.6 F (36.4 C)   Resp 18   Wt 188 lb 1.6 oz (85.3 kg)   LMP 07/05/2013   SpO2 99%   BMI 32.29 kg/m    GENERAL:alert, no distress and comfortable Chest: CTA bilaterally RRR Abdomen: soft, non tender, non distended, No LE edema.  LABORATORY DATA:  I have reviewed the  data as listed Lab Results  Component Value Date   WBC 4.9 01/10/2024   HGB 12.1 01/10/2024   HCT 36.6 01/10/2024   MCV 85.5 01/10/2024   PLT 299 01/10/2024   Lab Results  Component Value Date   NA 141 01/10/2024   K 3.7 01/10/2024   CL 107 01/10/2024   CO2 26 01/10/2024    RADIOGRAPHIC STUDIES: I have personally reviewed the radiological reports and agreed with the findings in the report.  ASSESSMENT AND PLAN:   Assessment and Plan Assessment & Plan  HER2-positive breast cancer Receiving trastuzumab  and pertuzumab  with curative intent. Infusion interval changed to every 28 days due to pt's preference. Efficacy of this interval uncertain but discussed.  Normal cardiac function (EF 60%).  Plans to start anastrozole  post-travel. Baseline bone density normal. - Changed trastuzumab  and pertuzumab  infusion schedule to every 28 days per insurance requirements after shared decision-making regarding interval and potential impact on efficacy. - Reviewed and confirmed normal cardiac function on most recent echocardiogram (EF 60-65%). - Instructed her to initiate anastrozole  now that she has returned from travel. - Adjusted future appointments to reflect the 28-day infusion schedule. - Baseline bone density normal. Continue monitoring every 2 yrs.  Time spent: 20 min  All questions were answered. The patient knows to call the clinic with any problems, questions or concerns.    Amber Stalls, MD 08/15/2024

## 2024-08-16 ENCOUNTER — Other Ambulatory Visit: Payer: Self-pay

## 2024-08-16 ENCOUNTER — Encounter: Payer: Self-pay | Admitting: Cardiology

## 2024-09-05 ENCOUNTER — Inpatient Hospital Stay: Admitting: Hematology and Oncology

## 2024-09-05 ENCOUNTER — Inpatient Hospital Stay

## 2024-09-10 ENCOUNTER — Inpatient Hospital Stay

## 2024-09-11 ENCOUNTER — Telehealth: Payer: Self-pay | Admitting: Hematology and Oncology

## 2024-09-12 ENCOUNTER — Inpatient Hospital Stay: Admitting: Hematology and Oncology

## 2024-09-12 ENCOUNTER — Inpatient Hospital Stay

## 2024-09-14 ENCOUNTER — Inpatient Hospital Stay: Attending: Hematology and Oncology

## 2024-09-14 ENCOUNTER — Inpatient Hospital Stay: Admitting: Hematology and Oncology

## 2024-09-14 VITALS — BP 118/73 | HR 72 | Temp 97.9°F | Resp 16 | Wt 190.8 lb

## 2024-09-14 DIAGNOSIS — E860 Dehydration: Secondary | ICD-10-CM

## 2024-09-14 DIAGNOSIS — C50412 Malignant neoplasm of upper-outer quadrant of left female breast: Secondary | ICD-10-CM

## 2024-09-14 MED ORDER — SODIUM CHLORIDE 0.9 % IV SOLN
420.0000 mg | Freq: Once | INTRAVENOUS | Status: AC
  Administered 2024-09-14: 420 mg via INTRAVENOUS
  Filled 2024-09-14: qty 14

## 2024-09-14 MED ORDER — SODIUM CHLORIDE 0.9 % IV SOLN: INTRAVENOUS | Status: DC

## 2024-09-14 MED ORDER — TRASTUZUMAB-ANNS CHEMO 150 MG IV SOLR
6.0000 mg/kg | Freq: Once | INTRAVENOUS | Status: AC
  Administered 2024-09-14: 525 mg via INTRAVENOUS
  Filled 2024-09-14: qty 25

## 2024-09-14 MED ORDER — ACETAMINOPHEN 325 MG PO TABS
650.0000 mg | ORAL_TABLET | Freq: Once | ORAL | Status: AC
  Administered 2024-09-14: 650 mg via ORAL
  Filled 2024-09-14: qty 2

## 2024-09-14 NOTE — Patient Instructions (Signed)
 [facility] - A DEPT OF [facility]  Discharge Instructions: Thank you for choosing [facility] to provide your oncology and hematology care.   If you have a lab appointment with the Cancer Center, please go directly to the Cancer Center and check in at the registration area.   Wear comfortable clothing and clothing appropriate for easy access to any Portacath or PICC line.   We strive to give you quality time with your provider. You may need to reschedule your appointment if you arrive late (15 or more minutes).  Arriving late affects you and other patients whose appointments are after yours.  Also, if you miss three or more appointments without notifying the office, you may be dismissed from the clinic at the providers discretion.      For prescription refill requests, have your pharmacy contact our office and allow 72 hours for refills to be completed.    Today you received the following chemotherapy and/or immunotherapy agents Trastuzumab -anns, Pertuzumab       To help prevent nausea and vomiting after your treatment, we encourage you to take your nausea medication as directed.  BELOW ARE SYMPTOMS THAT SHOULD BE REPORTED IMMEDIATELY: *FEVER GREATER THAN 100.4 F (38 C) OR HIGHER *CHILLS OR SWEATING *NAUSEA AND VOMITING THAT IS NOT CONTROLLED WITH YOUR NAUSEA MEDICATION *UNUSUAL SHORTNESS OF BREATH *UNUSUAL BRUISING OR BLEEDING *URINARY PROBLEMS (pain or burning when urinating, or frequent urination) *BOWEL PROBLEMS (unusual diarrhea, constipation, pain near the anus) TENDERNESS IN MOUTH AND THROAT WITH OR WITHOUT PRESENCE OF ULCERS (sore throat, sores in mouth, or a toothache) UNUSUAL RASH, SWELLING OR PAIN  UNUSUAL VAGINAL DISCHARGE OR ITCHING   Items with * indicate a potential emergency and should be followed up as soon as possible or go to the Emergency Department if any problems should occur.  Please show the CHEMOTHERAPY ALERT CARD  or IMMUNOTHERAPY ALERT CARD at check-in to the Emergency Department and triage nurse.  Should you have questions after your visit or need to cancel or reschedule your appointment, please contact [facility] - A DEPT OF [facility]  Dept: [phone]  and follow the prompts.  Office hours are 8:00 a.m. to 4:30 p.m. Monday - Friday. Please note that voicemails left after 4:00 p.m. may not be returned until the following business day.  We are closed weekends and major holidays. You have access to a nurse at all times for urgent questions. Please call the main number to the clinic Dept: [phone] and follow the prompts.   For any non-urgent questions, you may also contact your provider using MyChart. We now offer e-Visits for anyone 16 and older to request care online for non-urgent symptoms. For details visit [email or web address].   Also download the MyChart app! Go to the app store, search MyChart, open the app, select [place], and log in with your MyChart username and password.

## 2024-09-19 ENCOUNTER — Inpatient Hospital Stay

## 2024-09-19 ENCOUNTER — Inpatient Hospital Stay: Admitting: Hematology and Oncology

## 2024-10-08 ENCOUNTER — Inpatient Hospital Stay

## 2024-10-08 ENCOUNTER — Inpatient Hospital Stay: Admitting: Adult Health
